# Patient Record
Sex: Male | Born: 1955 | Race: White | Hispanic: No | Marital: Married
Health system: Midwestern US, Academic
[De-identification: ages and names within clinical notes are randomized; demographics above are authoritative.]

---

## 2022-03-19 IMAGING — DX Femur Knee Leg
3 series · 3 of 3 positions shown · non-contrast
Comparison: None.

Procedure(s): XR knee LT 3V

LEFT KNEE 3 VIEWS
INDICATION: Left knee pain

[AP (1 of 2)]
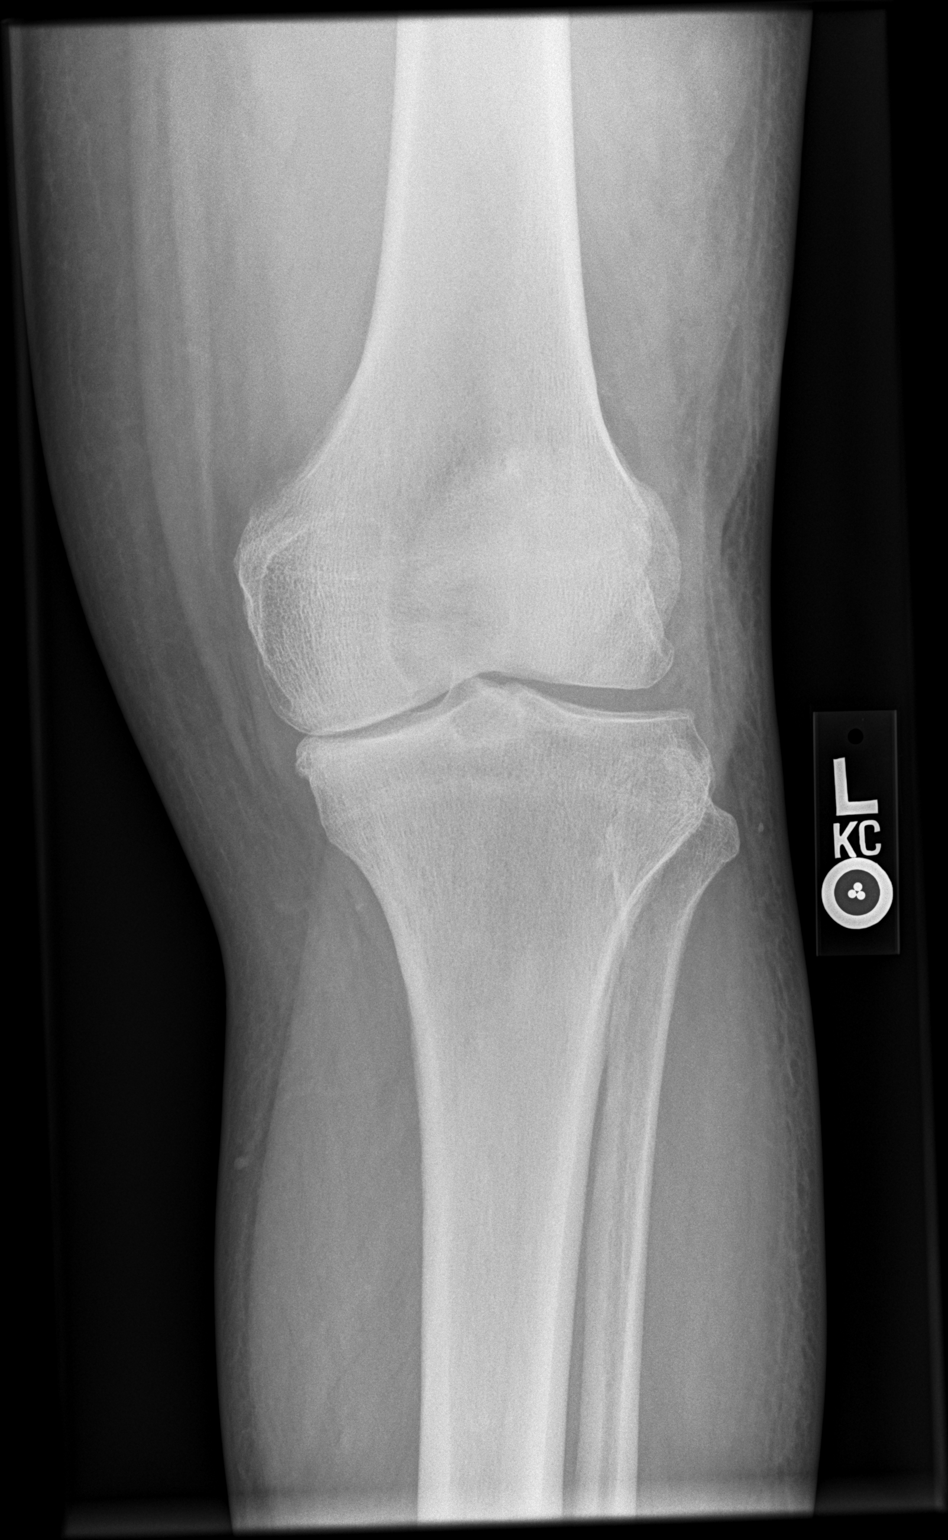

[AP (2 of 2)]
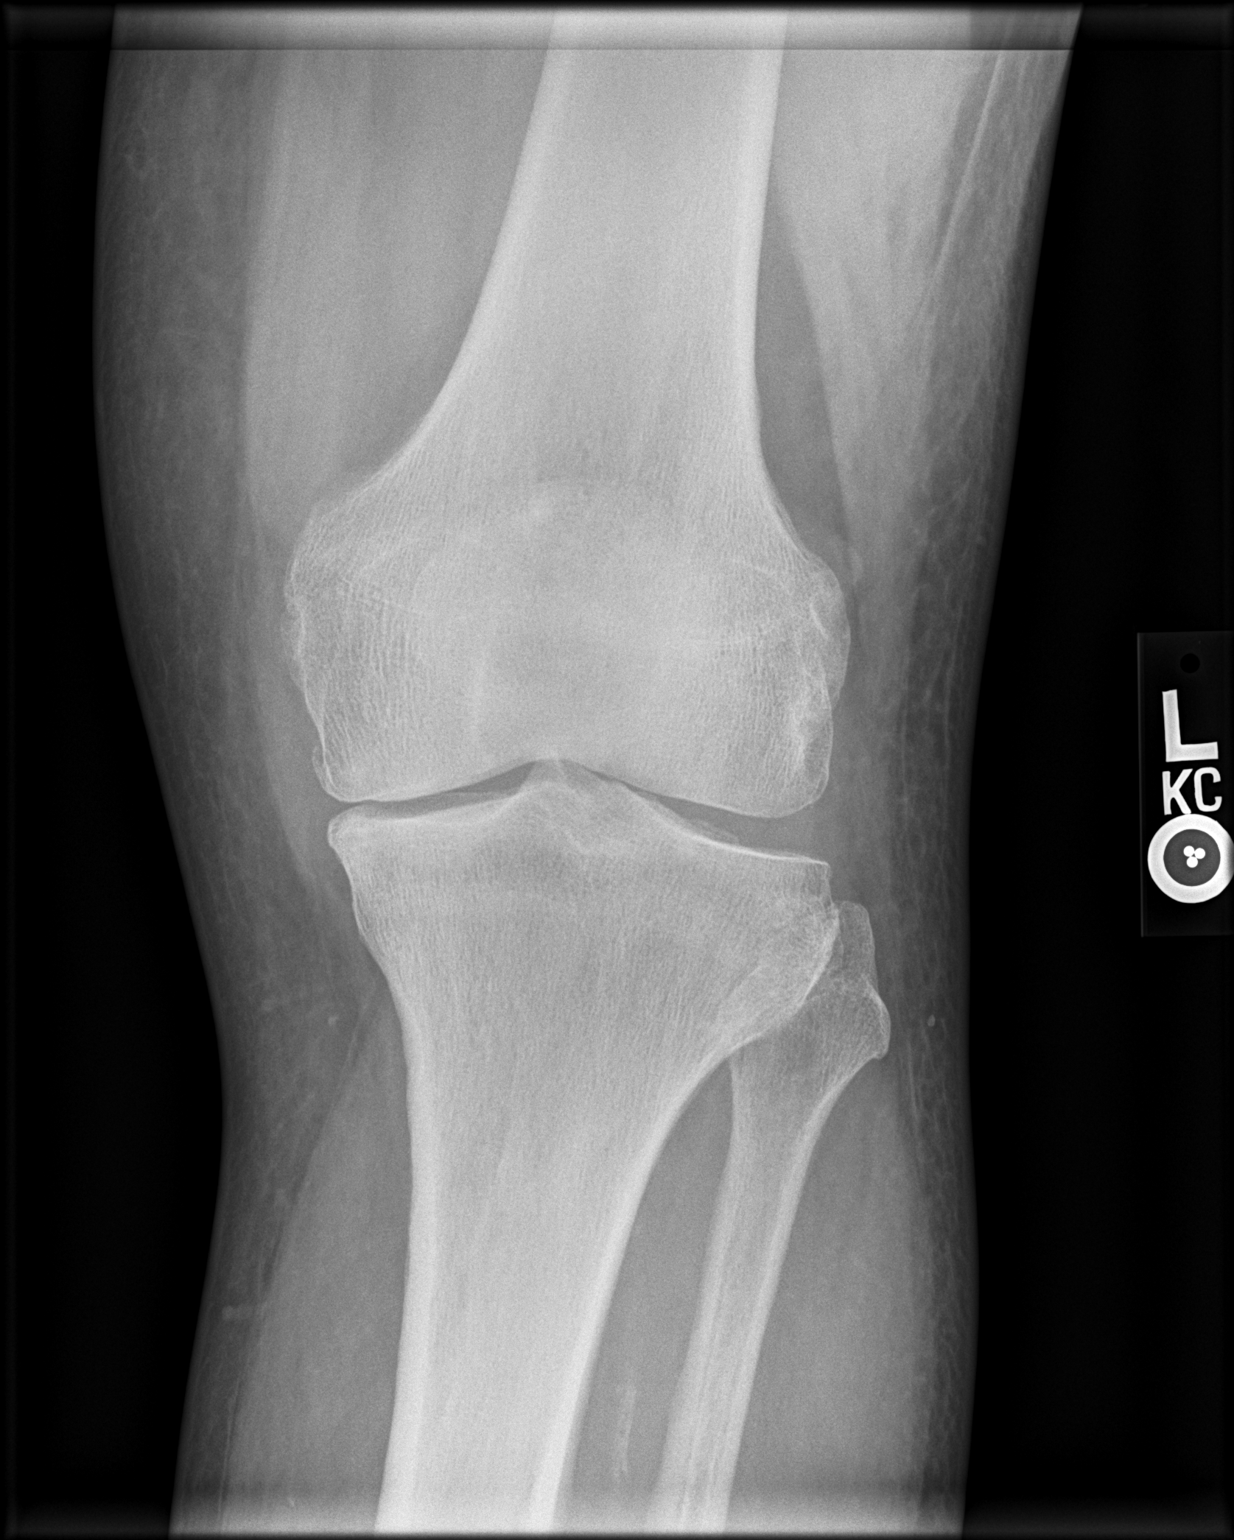

[left lateral]
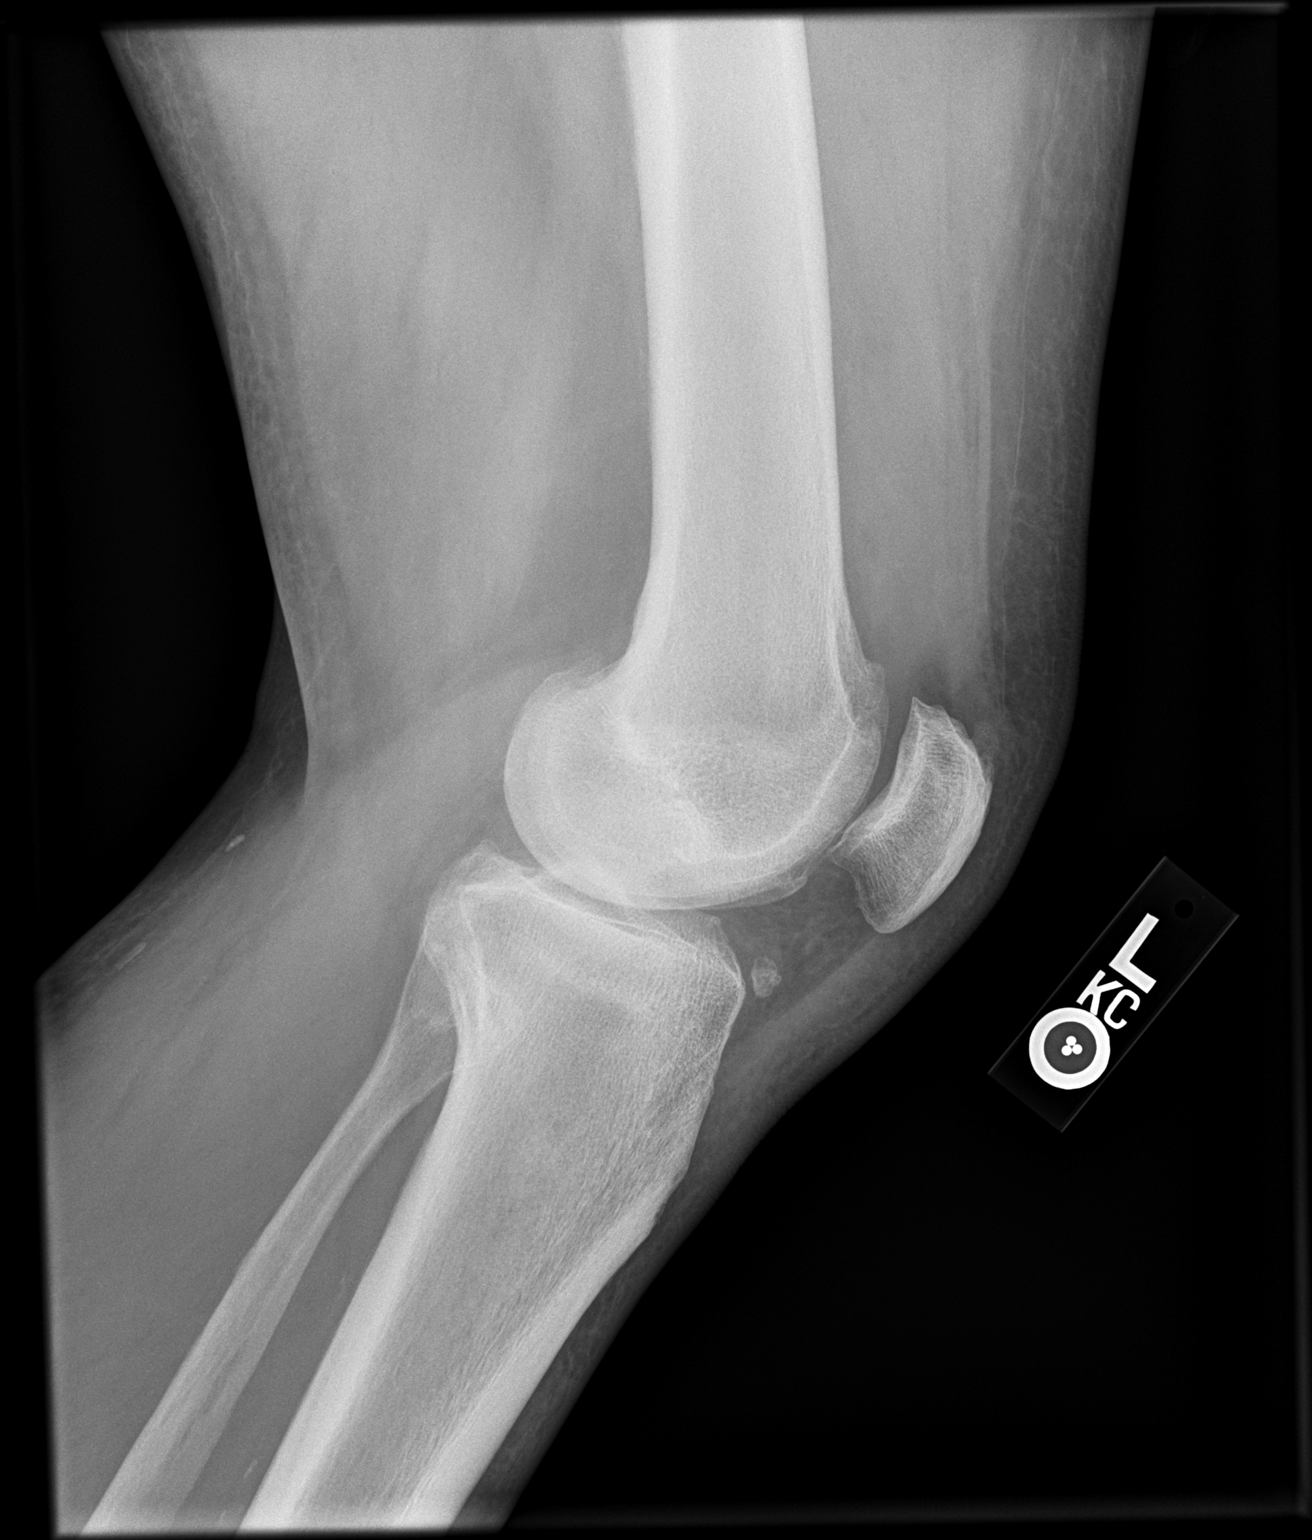

[3 of 3 positions shown; findings below may reference images not displayed]

FINDINGS: Narrowing of the medial compartment of the left knee.
Diffuse edema. The
patellofemoral compartment shows mild narrowing. No fracture
is seen. There is a
well-corticated ossific density in the fat pad anterior to
the proximal tibial
plateau near the midline. This may represent a loose body.
IMPRESSION: 1.  Degenerative changes and possible loose body. No
fracture is seen.

## 2022-05-02 IMAGING — MR KNEE^LEFT
5 of 7 series · 33 of 40 positions shown · non-contrast
Comparison: none

Procedure(s): MR knee LT wo con

EXAM:  MRI of the left knee.
INDICATIONS: Left knee pain.
TECHNIQUE: Routine MRI of the left knee utilizing standard
image sequences.

[Series 3: PD fat-sat · axial · 4.0mm · 0.50mm/px · z∈[-43,+82]mm · 7 of 26 slices shown]
[im 1/26]
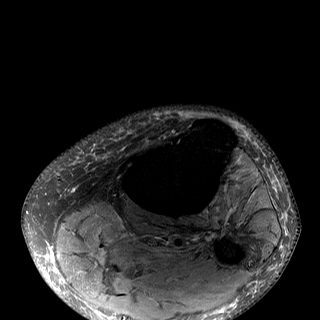
[im 5/26]
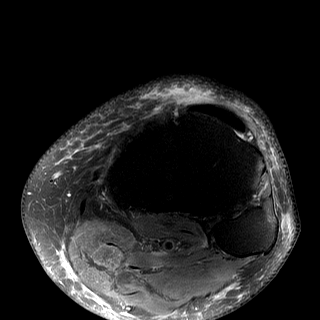
[im 9/26]
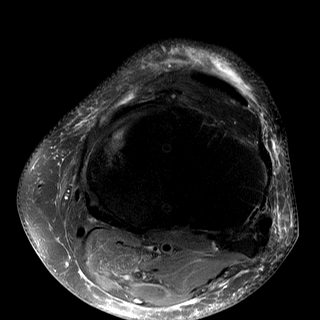
[im 13/26]
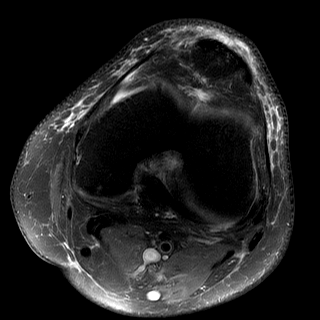
[im 17/26]
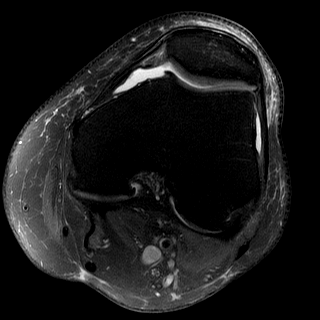
[im 21/26]
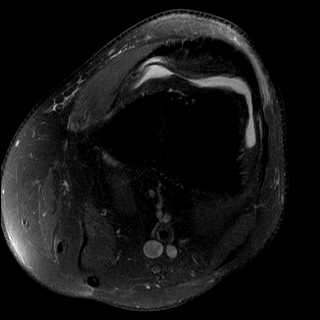
[im 26/26]
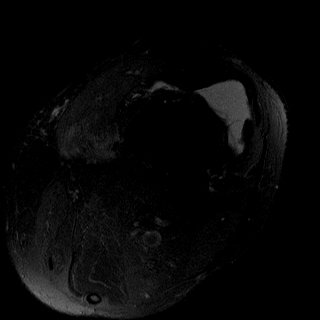

[Series 4: T1 · coronal · 4.0mm · 0.53mm/px · 7 of 27 slices shown]
[im 1/27]
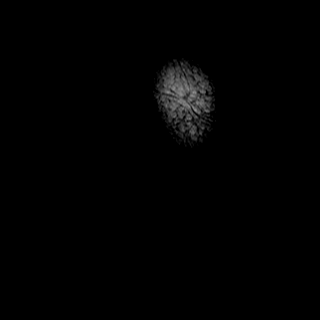
[im 5/27]
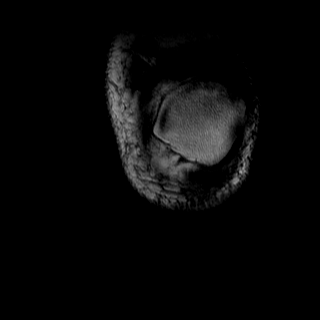
[im 9/27]
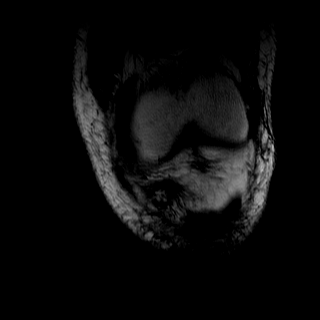
[im 14/27]
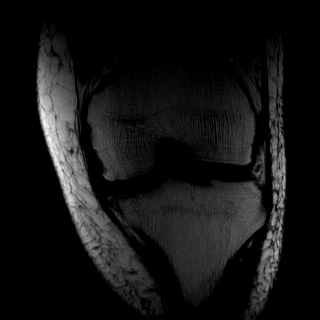
[im 18/27]
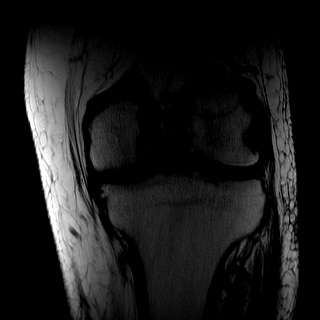
[im 22/27]
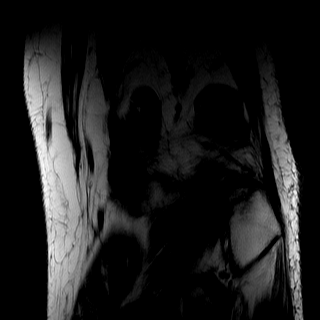
[im 27/27]
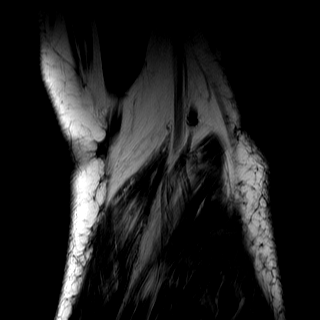

[Series 5: STIR · coronal · 4.0mm · 0.33mm/px · 5 of 27 slices shown]
[im 1/27]
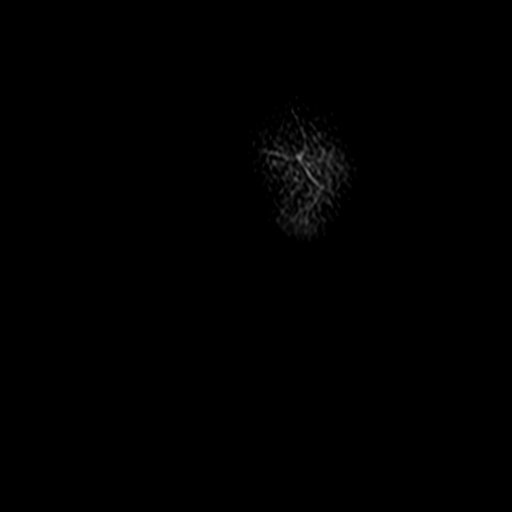
[im 5/27]
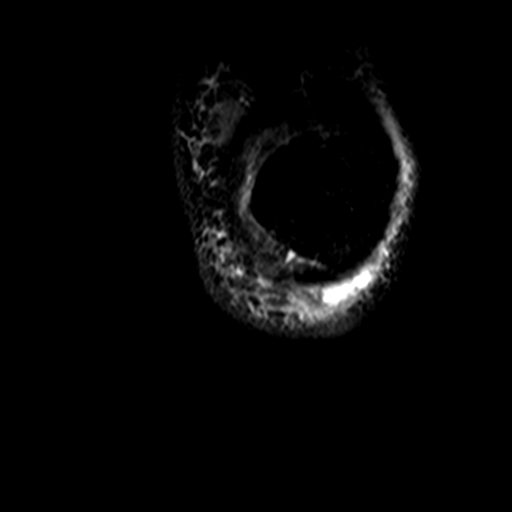
[im 9/27]
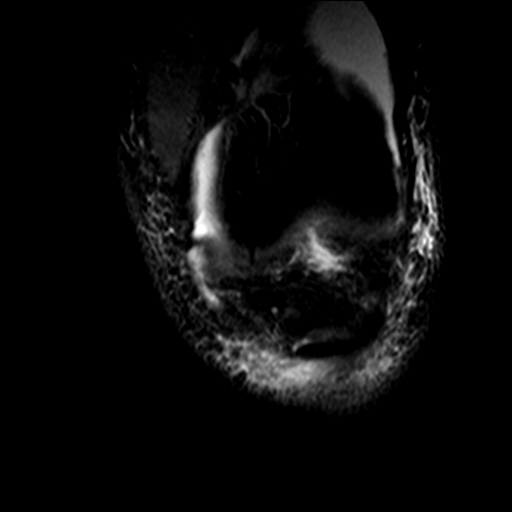
[im 14/27]
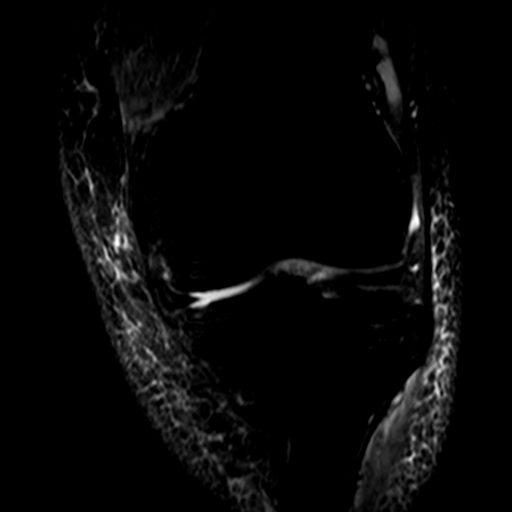
[im 18/27]
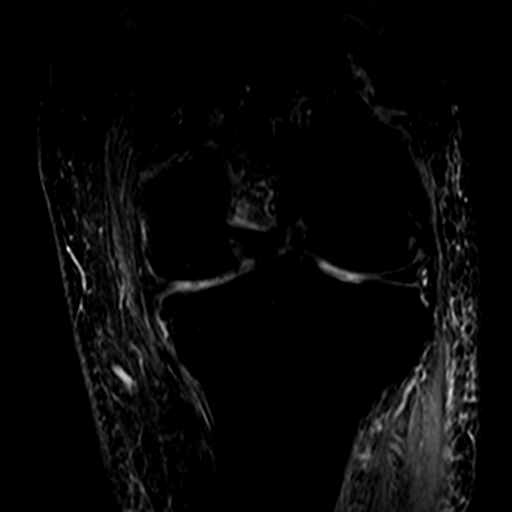

[Series 6: PD · sagittal · 4.0mm · 0.44mm/px · 7 of 27 slices shown]
[im 1/27]
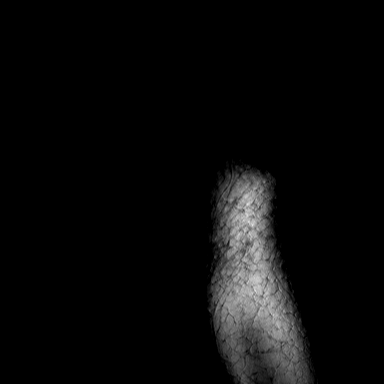
[im 5/27]
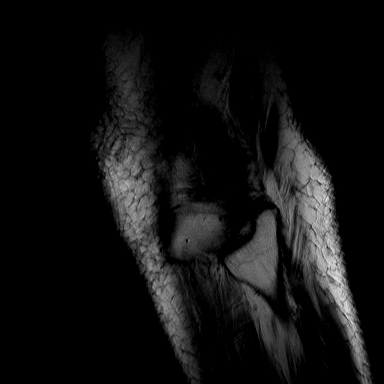
[im 9/27]
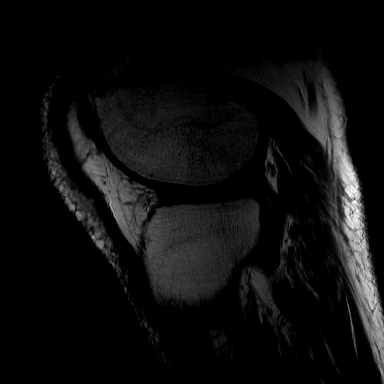
[im 14/27]
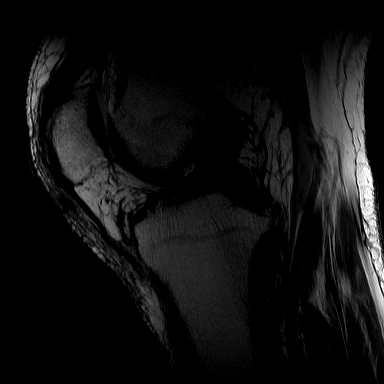
[im 18/27]
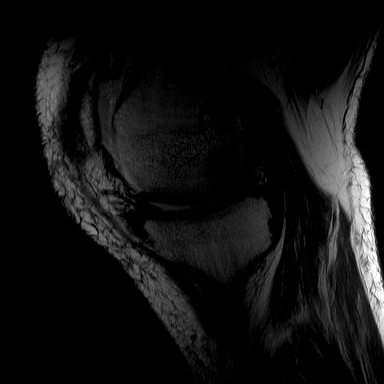
[im 22/27]
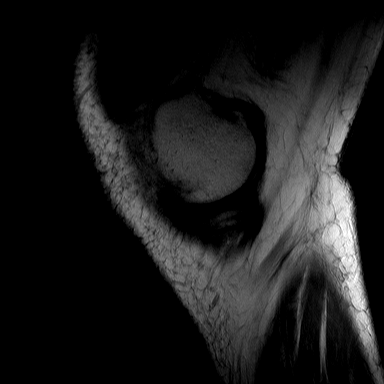
[im 27/27]
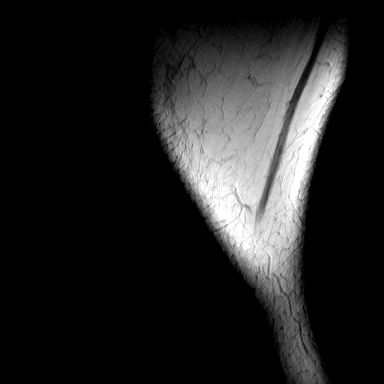

[Series 7: T2 fat-sat · sagittal · 4.0mm · 0.44mm/px · 7 of 27 slices shown]
[im 1/27]
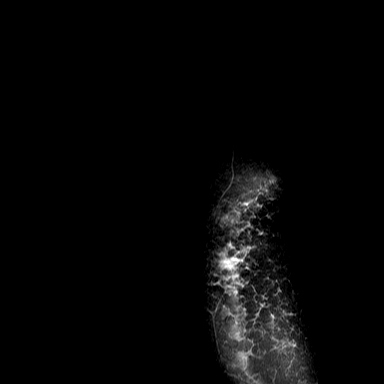
[im 5/27]
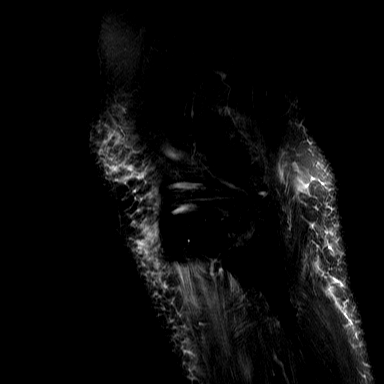
[im 9/27]
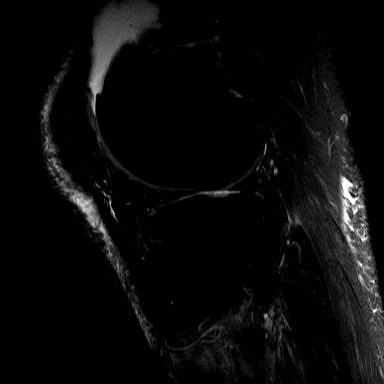
[im 14/27]
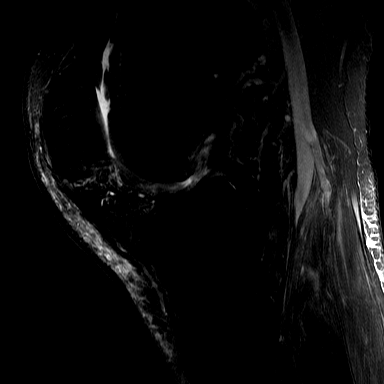
[im 18/27]
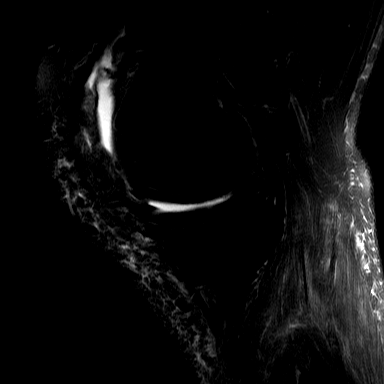
[im 22/27]
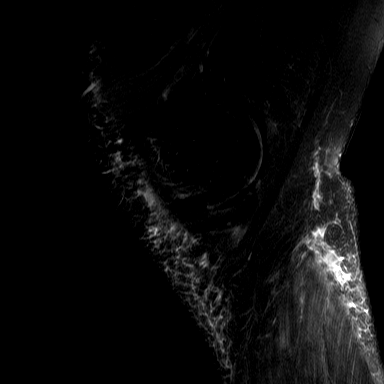
[im 27/27]
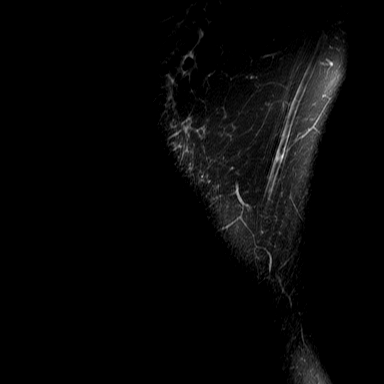

[33 of 40 positions shown; findings below may reference images not displayed]

FINDINGS: There is attenuation and irregularity of the body and
posterior horn of the
medial meniscus most compatible with a chronic degenerative
type tear. There is
mild meniscal extrusion.

The lateral meniscus demonstrates normal signal intensity
and morphology.

The cruciate ligaments, collateral ligaments, and extensor
mechanism are intact.

There is diffuse chondral thinning along the weightbearing
surface of the medial
femoral condyle and medial tibial plateau. There are small
marginal osteophytes.
The chondral surfaces of the lateral compartment are
relatively well-preserved.
There is partial-thickness chondral fibrillation along the
weightbearing surface
of the lateral tibial plateau which measures 13 x 10 mm. The
patellofemoral
compartment is relatively well-preserved with minimal
chondromalacia along the
inferior medial trochlea.

There is a small joint effusion.

There is no significant popliteal cyst. No popliteal fossa
masses are present.

The bone marrow signal intensity is normal.

There is mild edema and atrophy of the medial head of the
gastrocnemius.

The proximal tibiofibular joint is intact. The major
structures of the
posterolateral corner are unremarkable.
IMPRESSION: 1.  Complex degenerative tear of the body and posterior horn
of the medial
meniscus with moderate medial compartment osteoarthritis.

## 2022-05-02 IMAGING — MR MR lumbar spine wo con
4 of 9 series · 21 of 48 positions shown · non-contrast
Comparison: None.

Procedure(s): MR lumbar spine wo con

MRI LUMBAR SPINE WITHOUT CONTRAST
INDICATION: Back pain
TECHNIQUE: Magnetic resonance imaging of the lumbar spine
was performed to
evaluate the spinal canal and contents.  No IV contrast was
administered.

[Series 2: T2 · sagittal · 4.5mm · 0.84mm/px · 6 of 15 slices shown (1 of 3)]
[im 1/15]
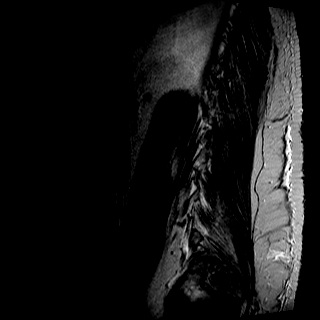
[im 3/15]
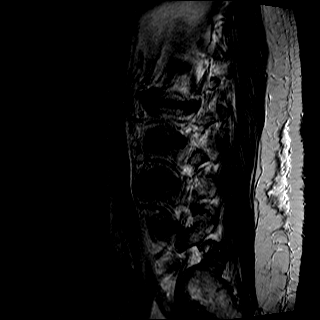
[im 6/15]
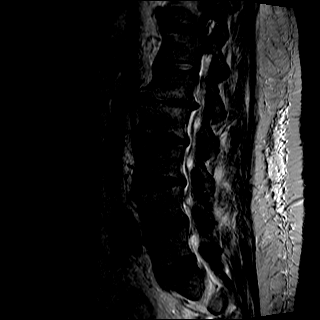
[im 9/15]
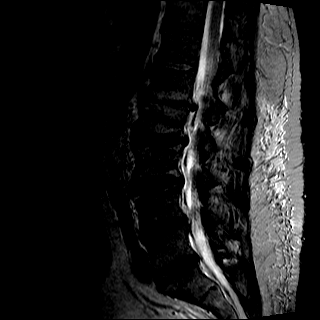
[im 12/15]
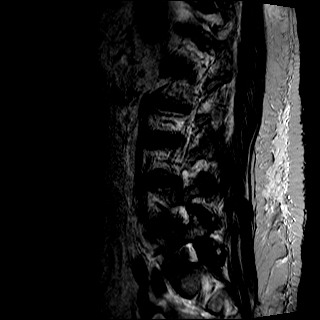
[im 15/15]
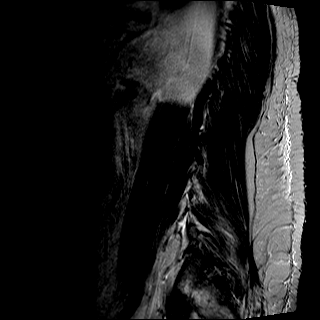

[Series 3: T1 · sagittal · 4.5mm · 0.44mm/px · 3 of 15 slices shown]
[im 3/15]
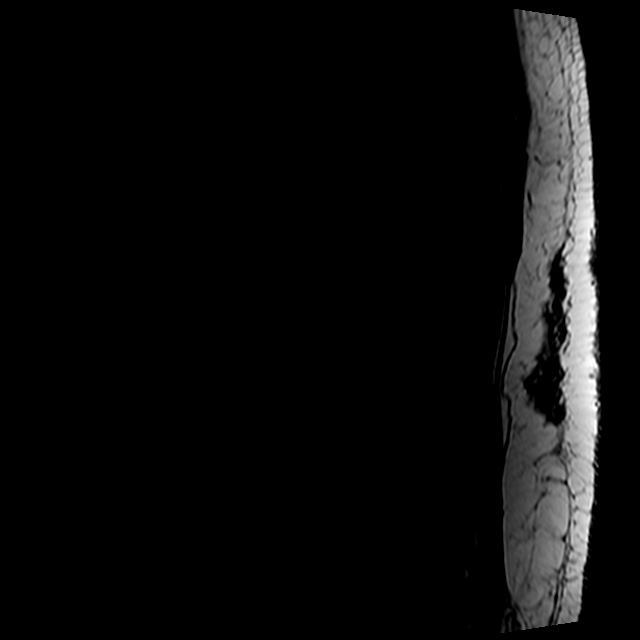
[im 9/15]
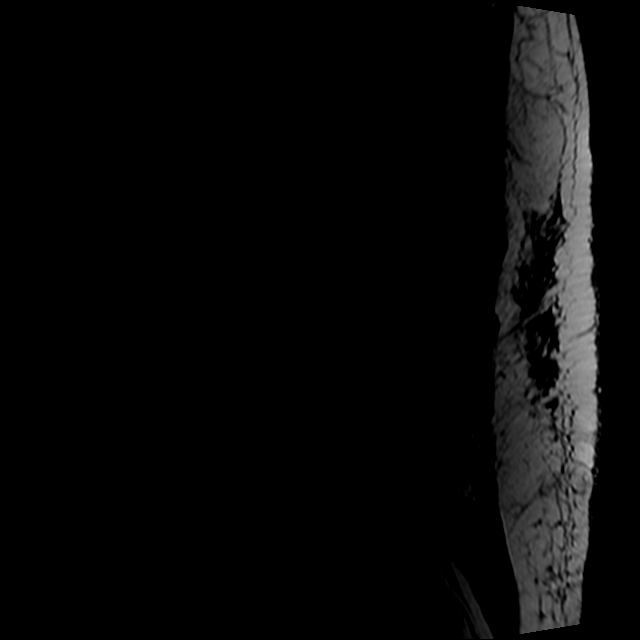
[im 15/15]
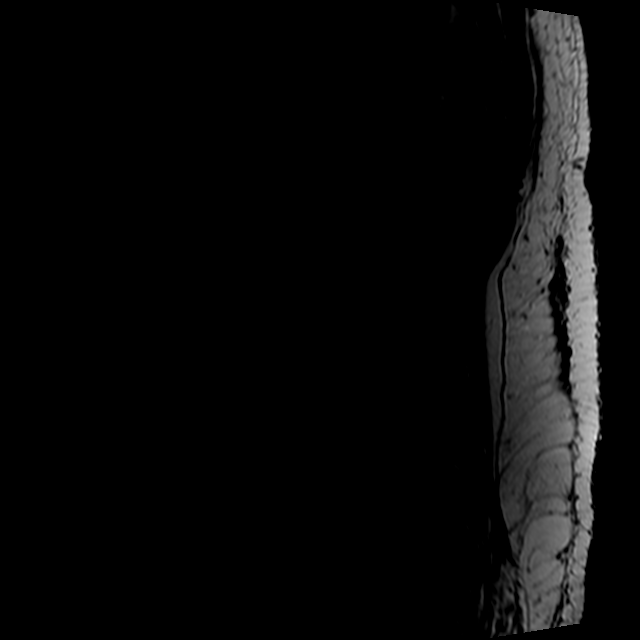

[Series 5: T2 · axial · 4.0mm · 0.49mm/px · z∈[-19,+71]mm · 7 of 19 slices shown (2 of 3)]
[im 1/19]
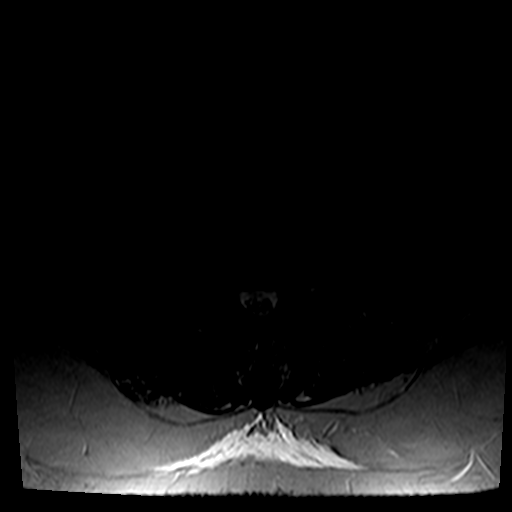
[im 4/19]
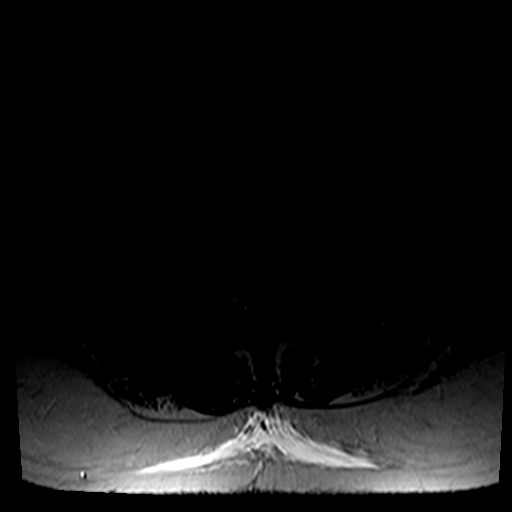
[im 7/19]
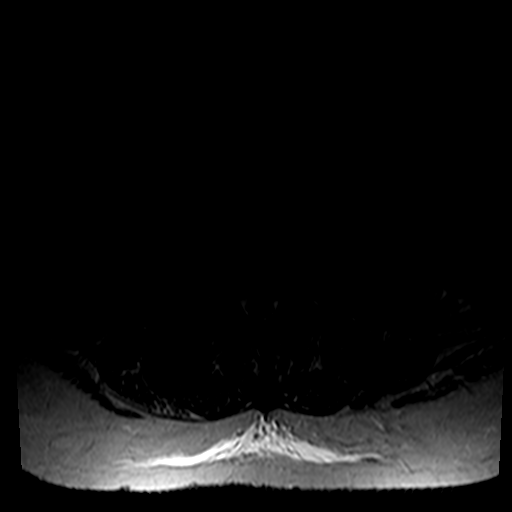
[im 10/19]
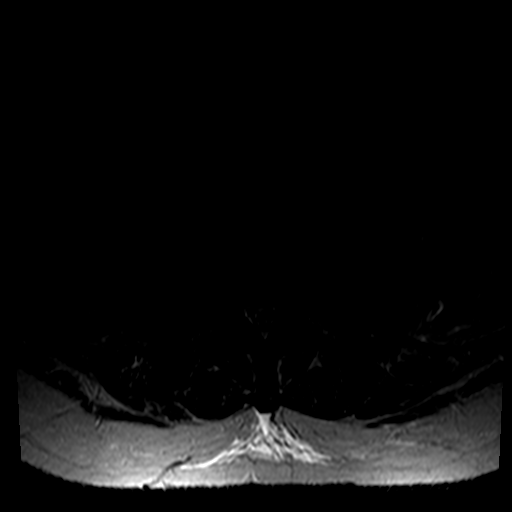
[im 13/19]
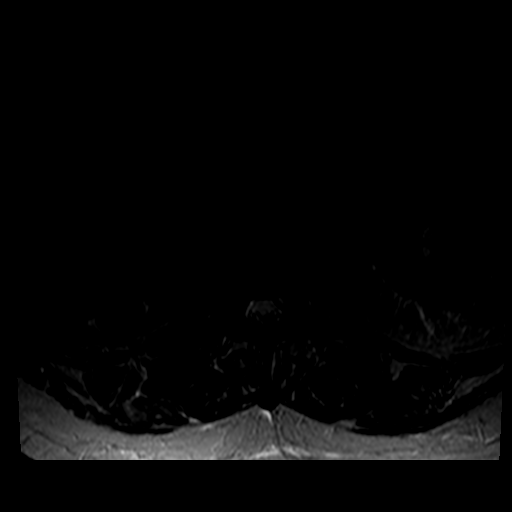
[im 16/19]
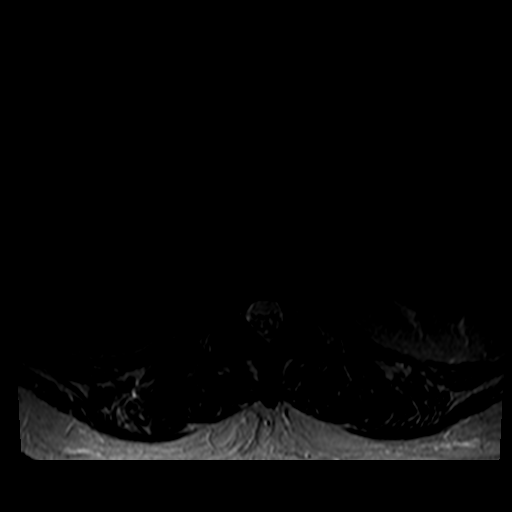
[im 19/19]
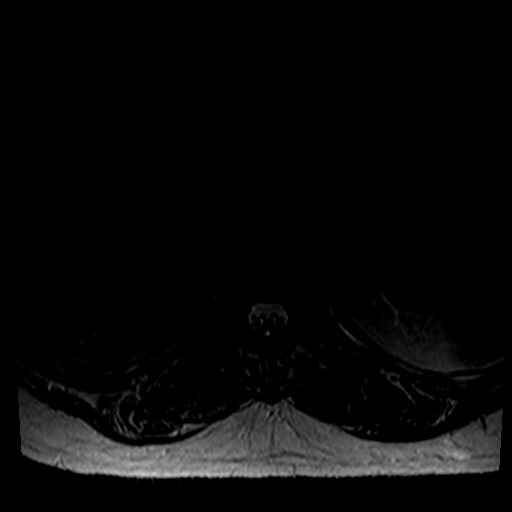

[Series 7: T2 · axial · 4.5mm · 0.55mm/px · z∈[-171,-83]mm · 5 of 20 slices shown (3 of 3)]
[im 1/20]
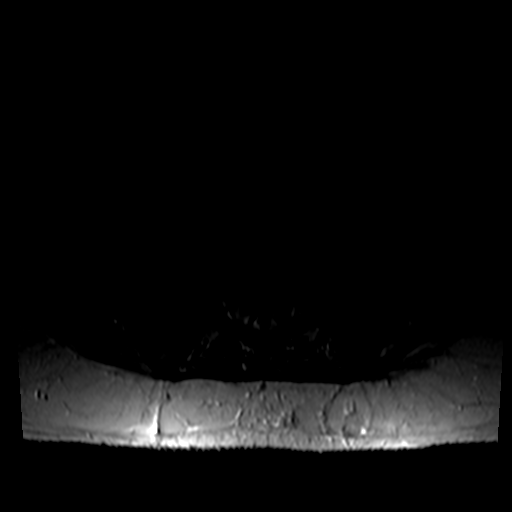
[im 3/20]
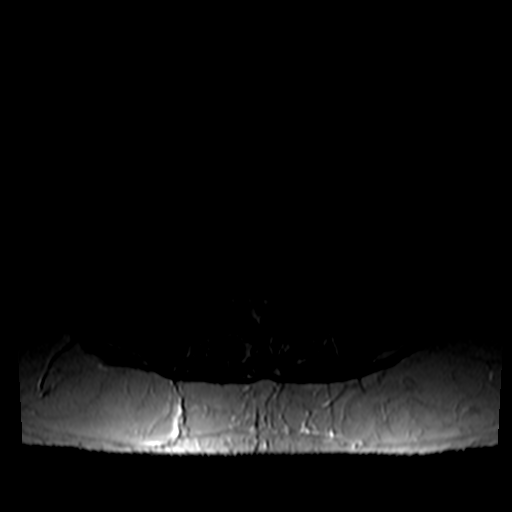
[im 6/20]
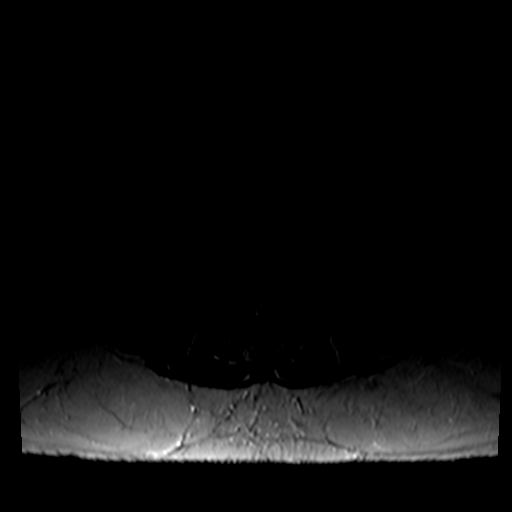
[im 11/20]
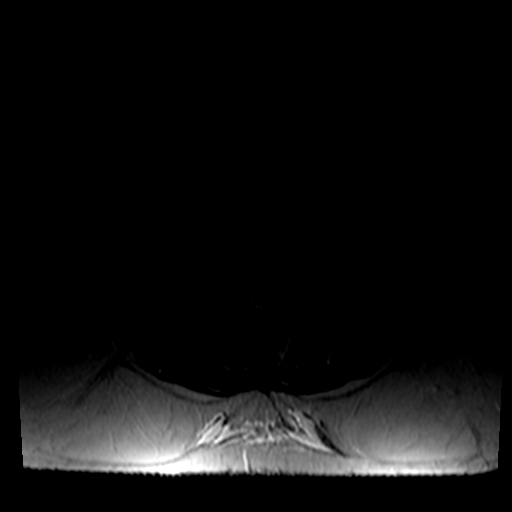
[im 17/20]
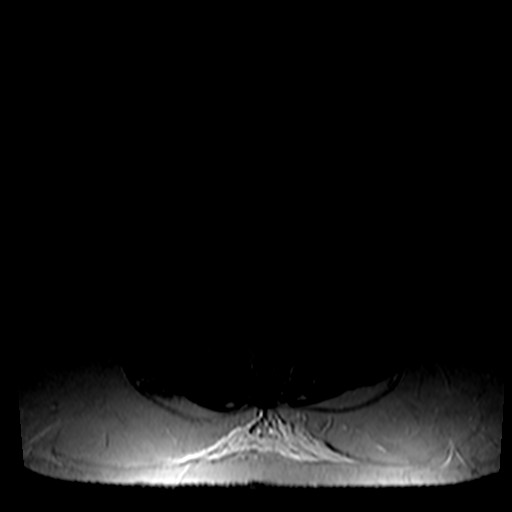

[21 of 48 positions shown; findings below may reference images not displayed]

FINDINGS: Alignment: Straightening of the normal lumbar lordosis is
noted.

Marrow signal: Discogenic endplate changes noted at L2-3. No
marrow replacing
process is seen.

Vertebral body heights: Appear maintained.

Conus: The conus appears unremarkable. Located in normal
position.

Lower thoracic discs: T11-12 demonstrates mild disc space
narrowing. No stenosis
is seen

L1-L2: Significant disc space narrowing, osteophyte
formation and mild facet
disease. There is preservation of the central spinal canal
at 1.3 cm. There is
osteophyte formation and mild disc bulge extending into the
neural foramina
bilaterally. This causes moderate bilateral neural foraminal
narrowing

L2-L3: Broad-based disc bulge, mild facet hypertrophic
change. There is
effacement of the thecal sac with the thecal sac measuring 9
mm, stenotic. Disc
material is seen extending into the neural foramina with
mild bilateral neural
foraminal narrowing

L3-L4: Disc bulge and facet hypertrophic change. Effacement
of the thecal sac
measuring 9 mm. Mild bilateral neural foraminal stenosis,
left slightly greater
than right

L4-L5: Facet and ligamentum flavum hypertrophic change.
Central annular tear and
protrusion. The thecal sac measures 9 mm. Mild lateral
recess and bilateral
neural foraminal narrowing

L5-S1: Facet hypertrophic change. Mild disc bulge. No
stenosis is seen.

Visualized sacral spine: Where visualized appears
unremarkable.
IMPRESSION: 1.  Multilevel lumbar spondylosis. Narrowing is seen
throughout the lumbar spine
but most severe in the neural foramina at L1-2 and L2-3.
Details are outlined
level by level above.
.

## 2022-12-24 ENCOUNTER — Encounter: Admit: 2022-12-24 | Discharge: 2022-12-24 | Payer: MEDICARE

## 2023-01-25 ENCOUNTER — Encounter: Admit: 2023-01-25 | Discharge: 2023-01-25 | Payer: MEDICARE

## 2023-01-25 NOTE — Progress Notes
RN faxed request for EMG report to:    [facility], [phone]; confirmed as sent successfully, job [phone].    [facility], [phone]; confirmed as sent successfully, job [phone].

## 2023-02-05 ENCOUNTER — Encounter: Admit: 2023-02-05 | Discharge: 2023-02-05 | Payer: MEDICARE

## 2023-02-10 ENCOUNTER — Encounter: Admit: 2023-02-10 | Discharge: 2023-02-10 | Payer: MEDICARE

## 2023-02-11 ENCOUNTER — Encounter: Admit: 2023-02-11 | Discharge: 2023-02-11 | Payer: MEDICARE

## 2023-02-11 ENCOUNTER — Ambulatory Visit: Admit: 2023-02-11 | Discharge: 2023-02-11 | Payer: MEDICARE

## 2023-02-11 DIAGNOSIS — G629 Polyneuropathy, unspecified: Secondary | ICD-10-CM

## 2023-02-11 DIAGNOSIS — G718 Other primary disorders of muscles: Secondary | ICD-10-CM

## 2023-02-11 DIAGNOSIS — E119 Type 2 diabetes mellitus without complications: Secondary | ICD-10-CM

## 2023-02-11 DIAGNOSIS — E785 Hyperlipidemia, unspecified: Secondary | ICD-10-CM

## 2023-02-11 DIAGNOSIS — E1144 Type 2 diabetes mellitus with diabetic amyotrophy: Secondary | ICD-10-CM

## 2023-02-11 DIAGNOSIS — I10 Essential (primary) hypertension: Secondary | ICD-10-CM

## 2023-02-11 DIAGNOSIS — M6281 Muscle weakness (generalized): Secondary | ICD-10-CM

## 2023-02-11 DIAGNOSIS — E1142 Type 2 diabetes mellitus with diabetic polyneuropathy: Secondary | ICD-10-CM

## 2023-02-11 LAB — CBC
MCH: 33 pg (ref 26–34)
MCHC: 34 g/dL (ref 32.0–36.0)
MCV: 94 FL (ref 80–100)
RBC COUNT: 4.7 M/UL (ref 4.4–5.5)
RDW: 14 % (ref 11–15)
WBC COUNT: 7.5 K/UL (ref 4.5–11.0)

## 2023-02-11 LAB — TSH WITH FREE T4 REFLEX: TSH: 2.4 uU/mL (ref 0.35–5.00)

## 2023-02-11 LAB — CREATINE KINASE-CPK: CK TOTAL: 54 U/L (ref 35–232)

## 2023-02-11 LAB — VITAMIN B12: VITAMIN B12: 359 pg/mL (ref 180–914)

## 2023-02-11 LAB — COMPREHENSIVE METABOLIC PANEL: SODIUM: 140 MMOL/L (ref 137–147)

## 2023-02-11 LAB — HEMOGLOBIN A1C: HEMOGLOBIN A1C: 6.6 % — ABNORMAL HIGH (ref 4.0–5.7)

## 2023-02-11 NOTE — Patient Instructions
Diabetic amyotrophy, affecting the left leg.  Symptoms started around February 2023.  His hemoglobin A1c was in the 9 range, his diabetes treatment was adjusted for better control and 1 down to 6.5 by May.  He did not have pain, but he does have significant weakness involving his quad and other musculature is noted in the exam table above.  He is trying to control his diabetes, quit drinking beer, and doing exercises which are all positive for better recovery.  He also has diabetic peripheral neuropathy.     Plan:  Lab work today including hemoglobin A1c, vitamin levels.  EMG nerve conduction study of the right arm and bilateral lower extremity with bilateral femoral motors.  Continue physical therapy.  He is doing walking exercises in the pool.  A formal physical therapy referral is reasonable.  Continue good control of diabetes.  Follow-up in a year for clinical monitoring.        Wyn Quaker, MD  Associate Professor of Neurology  Director, ALS Clinic  [facility]

## 2023-02-14 ENCOUNTER — Encounter: Admit: 2023-02-14 | Discharge: 2023-02-14 | Payer: MEDICARE

## 2023-02-14 DIAGNOSIS — G718 Other primary disorders of muscles: Secondary | ICD-10-CM

## 2023-02-14 DIAGNOSIS — E785 Hyperlipidemia, unspecified: Secondary | ICD-10-CM

## 2023-02-14 DIAGNOSIS — I10 Essential (primary) hypertension: Secondary | ICD-10-CM

## 2023-02-14 DIAGNOSIS — E119 Type 2 diabetes mellitus without complications: Secondary | ICD-10-CM

## 2023-02-14 DIAGNOSIS — G629 Polyneuropathy, unspecified: Secondary | ICD-10-CM

## 2023-03-03 ENCOUNTER — Encounter: Admit: 2023-03-03 | Discharge: 2023-03-03 | Payer: MEDICARE

## 2023-03-03 NOTE — Progress Notes
Received outside records for new patient appointment and given to MR for scanning.

## 2023-08-05 ENCOUNTER — Encounter: Admit: 2023-08-05 | Discharge: 2023-08-06 | Payer: MEDICARE

## 2023-08-12 ENCOUNTER — Encounter: Admit: 2023-08-12 | Discharge: 2023-08-13 | Payer: MEDICARE

## 2023-08-12 ENCOUNTER — Encounter: Admit: 2023-08-12 | Discharge: 2023-08-12 | Payer: MEDICARE

## 2023-08-27 ENCOUNTER — Encounter: Admit: 2023-08-27 | Discharge: 2023-08-28 | Payer: MEDICARE

## 2023-09-03 ENCOUNTER — Encounter: Admit: 2023-09-03 | Discharge: 2023-09-03 | Payer: MEDICARE

## 2023-09-04 ENCOUNTER — Ambulatory Visit: Admit: 2023-09-04 | Discharge: 2023-09-05 | Payer: MEDICARE

## 2023-09-04 ENCOUNTER — Encounter: Admit: 2023-09-04 | Discharge: 2023-09-04 | Payer: MEDICARE

## 2023-09-04 DIAGNOSIS — C028 Malignant neoplasm of overlapping sites of tongue: Secondary | ICD-10-CM

## 2023-09-04 DIAGNOSIS — R591 Generalized enlarged lymph nodes: Secondary | ICD-10-CM

## 2023-09-04 DIAGNOSIS — K148 Other diseases of tongue: Secondary | ICD-10-CM

## 2023-09-04 NOTE — Progress Notes
Chief Complaint   Patient presents with    New Patient           History of Present Illness:   Daryl Baker is a 67 y.o. year old male evaluated on 09/04/2023, in the Otolaryngology-Head and Neck Surgery Clinic at the [facility] The patient was referred by Dr. Jeanice Lim for evaluation of mass in the back of his throat that has been present for at least 3 months.       He reports a left neck mass as well. Per documentation on outside exam a large ulcerative mass of the left BOT and vallecular with extension down the left aryepiglottic fold and pyriform sinus noted. Patient reports some difficulty swallowing, but denies trouble breathing. He is a former smoker who quit 40 years ago.      Has some voice changes, difficulty swallowing. Losing weight intentionally with trulicity. No pain when eating.    No hemoptysis, breathing troubles, vision changes, changes in facial movement or sensation, weight loss.    Lurline Idol smokes 1 PPD for 10 years quit 40 years ago. Reports occasional binge drinking.    No FH of H&N cancer.       Patient denies a history of OSA, chest pain, MI, CVA, Sz, DOE or DM.  No blood thinners.    The patient can walk with walker fairly unrestricted.           Past Medical/Surgical History  He  has a past medical history of DM (diabetes mellitus) (HCC), Hyperlipidemia, Hypertension, Neurogenic amyotrophy, and Neuropathy.  His  has a past surgical history that includes Abdominal hernia repair and knee arthroscopy (Left).     Past Family/Social History  His family history includes Cancer in his mother; Cancer-Lung in his mother; Diabetes in his mother; Huntington's Chorea in his maternal grandfather; Hypertension in his mother; Stroke in his father.  He  reports that he has quit smoking. His smoking use included cigarettes. He has been exposed to tobacco smoke. He has never used smokeless tobacco. He reports current alcohol use of about 1.0 standard drink of alcohol per week. He reports that he does not use drugs.     Medications/Allergies/Immunizations  His current medication(s) include:   Current Outpatient Medications   Medication Sig Dispense Refill    FREESTYLE LIBRE 2 READER reader Use one each as directed every 6 hours.      glimepiride (AMARYL) 4 mg tablet Take one tablet by mouth twice daily with meals.      losartan (COZAAR) 100 mg tablet Take one tablet by mouth daily.      meloxicam (MOBIC) 15 mg tablet Take one tablet by mouth daily.      metFORMIN-XR (GLUCOPHAGE XR) 750 mg extended release tablet Take one tablet by mouth daily with dinner.      metoprolol succinate XL (TOPROL XL) 100 mg extended release tablet Take one tablet by mouth daily.      TRULICITY 0.75 mg/0.5 mL injection pen Inject 0.5 mL under the skin every 7 days.       No current facility-administered medications for this visit.       Allergies: Patient has no known allergies.     Review of Systems   Constitutional: Negative for fever, weight loss and weight gain.  Skin: Negative for rash, itchiness, dryness  HENT: Negative for ear pain, sore throat and hoarseness.  Negative for difficulty swallowing.  Cardiovascular: Negative for chest pain and dyspnea on exertion (  Can climb up 2 floors).   Respiratory: Is not experiencing shortness of breath.   Gastrointestinal: Negative for nausea and vomiting.   Neurological: Negative for headaches.   Lymph/Heme: Negative for lymphadenopathy or easy bruising  Musculoskeletal: Negative for joint or muscle pain  Psychiatric: The patient is not nervous/anxious.        All other systems are negative except for that listed in the HPI.      PHYSICAL EXAM:   Vital Signs:  BP (!) 188/99  - Pulse 58  - Ht 177.8 cm (5' 10)  - Wt 109.8 kg (242 lb)  - BMI 34.72 kg/m?      General:  Well-developed, well-nourished  Communication and Voice:  Clear pitch and clarity  Hearing: Hearing adequate for verbal communication bilaterally   Inspection:  Normocephalic and atraumatic without mass or lesion  Palpation:  Facial skeleton intact without bony stepoffs  Parotid Glands:  No mass or tenderness  Facial Strength:  Facial motility symmetric and full bilaterally  Pinna:  External ear intact and fully developed  External canal:  Canal is patent with intact skin  Tympanic Membrane:  Clear and mobile  External nose:  No scar or anatomic deformity  Internal Nose:  Septum intact and midline.  No edema, polyp, or rhinorrhea.  TMJ:  No pain to palpation with full mobility  Oral cavity, Lips, Teeth, and Gums:  Mucosa and teeth intact and viable  Oropharynx: No erythema or exudate,  non-obstructive tonsils. Firm palpable left BOT tumor. Not easily visible on OC exam without pulling patient's tongue forward. Does not extend to the palate or the tonsil that can be seen on OC exam.  Nasopharynx:  No mass or lesion with intact mucosa  Neck, Trachea, Lymphatics:  Midline trachea without mass or lesion, bilateral nodal involvement in lateral neck L > R.  Multiple palpable lymph nodes.  Most prominently left level 2.  Thyroid:  No mass or nodularity  Eyes: No nystagmus with equal extraocular motion bilaterally  Neuro/Psych/Balance: Patient oriented and appropriate in interaction;  Appropriate mood and affect;  Gait is intact with no imbalance; Cranial nerves I-XII are intact  Respiratory effort:  Equal inspiration and expiration without stridor  Peripheral Vascular:  Warm extremities with equal pulses       LARYNGOSCOPY: Flexible laryngoscopy was performed using flexible laryngoscope through the left nare.  The nasal passage was clear and without polyposis or mucopurulent secretions.  The nasopharyngeal mucosa appeared normal.    The fossa of Rosenmuller was clear and the eustachian tube openings were without mass or lesion. Large left sided BOT tumor with mucosal erosion. Does cross midline. Does involve lingual surface of the epiglottis and extend into the vallecula as well as to the left AE fold.  The false folds appeared normal and were without mucosal mass or lesion. The true vocal folds were without discernable mucosal or submucosal lesions.  The right and left cords adducted and abducted fully.  Though difficult to assess without stroboscopy, closure appeared to be complete.  A limited view of the subglottis revealed no mucosal lesions or stenosis.        PATHOLOGY REVIEW:   No biopsy yet.     RADIOLOGIC REVIEW:         Reports reviewed.  Will get imaging clouded.        Assessment and Plan:   My impression is that Mr. 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    Recommend US guided biopsy of left lymph node.  Will require p16 testing.  Once confirm tissue diagnosis would get a PET scan.     Following completion of evaluation we will present patient at TB and then likely anticipate treatment with primary chemoradiation. Will require med onc and rad onc referrals once a tissue diagnosis is established.            Total time on this visit 63 min including preparing to see patient by reviewing records, performing interview and exam, counseling and medical decision making, documentation.

## 2023-09-05 ENCOUNTER — Encounter: Admit: 2023-09-05 | Discharge: 2023-09-05 | Payer: MEDICARE

## 2023-09-08 ENCOUNTER — Encounter: Admit: 2023-09-08 | Discharge: 2023-09-08 | Payer: MEDICARE

## 2023-09-08 DIAGNOSIS — C069 Malignant neoplasm of mouth, unspecified: Secondary | ICD-10-CM

## 2023-09-08 NOTE — Telephone Encounter
Wife called , linda she has come questions regarding visit  12/5 . Please call her back .

## 2023-09-08 NOTE — Telephone Encounter
Call returned to the patient and the PET scan is being rescheduled after the biopsy so hopefully it can coordinate with his future appointments with medical and radiation oncology yet to be scheduled.

## 2023-09-12 ENCOUNTER — Encounter: Admit: 2023-09-12 | Discharge: 2023-09-12 | Payer: MEDICARE

## 2023-09-19 ENCOUNTER — Encounter: Admit: 2023-09-19 | Discharge: 2023-09-19 | Payer: MEDICARE

## 2023-09-19 ENCOUNTER — Ambulatory Visit: Admit: 2023-09-19 | Discharge: 2023-09-19 | Payer: MEDICARE

## 2023-09-19 NOTE — Progress Notes
Name: Daryl Baker          MRN: [id number]      DOB: April 18, 1956      AGE: 67 y.o.   DATE OF SERVICE: 09/26/2023                Reason for Visit: Establish Medical Oncology Care with Dr Daryl Baker    History of Present Illness    PCP: Daryl Baker  Presenting Physician: Dr. Tama Baker   Medical Oncologist:Dr. Neita Baker  Surgeon: Daryl Gula, MD  Radiation Oncologist: Daryl Providence, MD     Discussion:  Prospective discussion                 Reason for presentation: clarify pathology, discuss tx options                 Clinical History/Significant PMH:  67 y.o male being referred for oropharyngeal mass with lymphadenopathy. Patient was referred to ENT Dr. Jeanice Baker for mass in the back of his throat that has been present for at least 3 months. He reports a left neck mass as well. Per outside report on exam a large ulcerative mass of the left BOT and vallecular with extension down the left aryepiglottic fold and pyriform sinus noted. Patient reports some difficulty swallowing, but denies trouble breathing. He is a former smoker who quit 40 years ago.        Interval History:    Weight loss 38 lbs over the last 6 mos     Odynophagia    (L) tinnitus     Some left-sided lymph nodes that are minimally bothering    Hb A1c 6.7     He is a truck driver.  He smoked almost 40 years ago.  Occasional binging with EtOH.  Accompanied with spouse today.      Resulted Micro Last 72 Hrs    No results found            Resulted Micro Last 72 Hrs    No results found           Cancer Staging   No matching staging information was found for the patient.         There is no STAGING for Gliomas, staging is not the same as GRADING.         Review of Systems              FREESTYLE LIBRE 2 READER reader Use one each as directed every 6 hours.    glimepiride (AMARYL) 4 mg tablet Take one tablet by mouth twice daily with meals.    losartan (COZAAR) 100 mg tablet Take one tablet by mouth daily.    meloxicam (MOBIC) 15 mg tablet Take one tablet by mouth daily.    metFORMIN-XR (GLUCOPHAGE XR) 750 mg extended release tablet Take one tablet by mouth daily with dinner.    metoprolol succinate XL (TOPROL XL) 100 mg extended release tablet Take one tablet by mouth daily.    TRULICITY 0.75 mg/0.5 mL injection pen Inject 0.5 mL under the skin every 7 days.     Vitals:    09/26/23 1248   BP: (!) 156/91   BP Source: Arm, Right Upper   Pulse: 104   Temp: 36.3 ?C (97.4 ?F)   Resp: 18   SpO2: 98%   PainSc: Zero   Weight: 109.8 kg (242 lb)     Body mass index is 34.72 kg/m?Marland Kitchen     Pain Score: Zero  Fatigue Scale: 0-None     Physical Exam      Domains    Gait 1 - Abnormal but walks without assistance    Strength 0- Normal   Ataxia (upper extremities) 0- Normal   Sensation 0- Normal   Visual fields 0- Normal   Facial strength 0- Normal   Language 0- Normal   Level of consciousness 0- Normal   Behavior 0- Normal     TOTAL NANO SCORE: 1    This is an age-appropriate obese 67 year old Caucasian male with a KPS of % 90.  VSS are stable and as above. AAO x 3.HEENT: Cranial nerves are intact. PERRLA. EOMI. TML. Transoral exam within normal limits. Neck: Supple, with normal ROM.  BOT tumor not visible large left level 2 l lymphadenopathy with ill-defined borders noted lymphadenopathy.  THORAX: Lungs clear to auscultation bilaterally. PSYCH: Pleasant, cooperative.         CBC w diff    Lab Results   Component Value Date/Time    WBC 7.5 02/11/2023 11:43 AM    RBC 4.76 02/11/2023 11:43 AM    HGB 15.7 02/11/2023 11:43 AM    HCT 45.1 02/11/2023 11:43 AM    MCV 94.7 02/11/2023 11:43 AM    MCH 33.1 02/11/2023 11:43 AM    MCHC 34.9 02/11/2023 11:43 AM    RDW 14.6 02/11/2023 11:43 AM    PLTCT 192 02/11/2023 11:43 AM    MPV 10.1 02/11/2023 11:43 AM    No results found for: NEUT, ANC, LYMA, ALC, MONA, AMC, EOSA, AEC, BASA, ABC     Comprehensive Metabolic Profile    Lab Results   Component Value Date/Time    NA 140 02/11/2023 11:43 AM    K 3.9 02/11/2023 11:43 AM    CL 104 02/11/2023 11:43 AM    CO2 25 02/11/2023 11:43 AM    GAP 11 02/11/2023 11:43 AM    BUN 6 (L) 02/11/2023 11:43 AM    CR 0.75 02/11/2023 11:43 AM    GLU 141 (H) 02/11/2023 11:43 AM    Lab Results   Component Value Date/Time    CA 9.3 02/11/2023 11:43 AM    ALBUMIN 4.4 02/11/2023 11:43 AM    TOTPROT 7.2 02/11/2023 11:43 AM    ALKPHOS 79 02/11/2023 11:43 AM    AST 18 02/11/2023 11:43 AM    ALT 17 02/11/2023 11:43 AM    TOTBILI 0.8 02/11/2023 11:43 AM         TSH   Lab Results   Component Value Date/Time    TSH 2.41 02/11/2023 11:43 AM         Pathological Data ( including NGS, if applicable)    No results found for requested labs within last 365 days.         Radiological Data:     IR LYMPH NODE BIOPSY  Narrative: Exam: Ultrasound-guided core biopsy of neck mass.    History: Neck mass.    Comparison studies: Recent CT scan.    Technique: After obtaining informed written consent the patient was placed supine on the procedure table. The neck was prepped and draped in sterile fashion. Ultrasound was used to identify the left neck mass. Permanent sonographic images were obtained. Lidocaine was used for local anesthesia. An 18-gauge core biopsy device was advanced under sonographic visualization into the lymph node. Permanent sonographic images were obtained to document the position of the device within the lymph node. 3 core biopsies were obtained, these were placed in formalin,  and sent to surgical pathology.    The patient tolerated the procedure well and left the department in good condition.  Impression: Impression: Ultrasound-guided core biopsy of a left neck mass.    I, Elvera Lennox, M.D., the attending radiologist, was present for the procedure, personally reviewed the images, and formulated the interpretations and opinions expressed in this report.    @TT      Finalized by Elvera Lennox, M.D. on 09/19/2023 9:24 AM. Dictated by Elvera Lennox, M.D. on 09/19/2023 9:23 AM.                Pain Addressed:  Patient declines intervention    Patient Evaluated for a Clinical Trial: Patient currently in screening for a treatment clinical trial.     Eastern Cooperative Oncology Group performance status is 1, Restricted in physically strenuous activity but ambulatory and able to carry out work of a light or sedentary nature, e.g., light house work, office work.         Impression/Recommendation:      This a very nice 67 year old gentleman who appears to have a large left BOT tumor but multilevel most prominent level 2 bilateral nodal involvement left greater than right.  The tumor does not appear to be crossing the midline.  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    He will be getting interval PET scan next week and see Dr. Mariane Masters next Friday.    We have briefly talked about de-intensification trials for p16+ oropharyngeal cancers,utilizing less radiation of 60 Gy in 6 weeks versus 60 Gy in 5 weeks with or without chemotherapy.  However, all of these de-intensification trials are currently under investigation, therefore not recommendable as standard treatment at this time. Typical standard XRT approach is 70 Gy of IMRT in 35 fractions with 56 Gy to the at risk lymph nodes which is SOC at this time.  Therefore, concurrent with IMRT, we are planning to proceed with standard treatment 70 Gy in 35 fractions along withweekly cisplatinum at 40 mg/m?    Depending on the level of response, he would be eligible for EVOLVE trial.        Toxicity profile of cis-platinum along with AE's have been discussed in detail with the patient.      -Dental Evaluation have been completed  -Baseline hearing test  -Baseline CBC CMP and TSH  -PET scan pending  - Dr Mariane Masters appointment next week   -Port placement  -Chemo teach and AIU appointment       The case has been extensively discussed with the patient and the family in the room who verbalized understanding, questions were answered and a total of 55 minutes was spent discussing about the diagnosis,prognosis, treatment options and toxicity profile as well as long-term outcomes.     Gita Kudo, MD   Neuro-Oncology/Medical Oncology

## 2023-09-26 ENCOUNTER — Encounter: Admit: 2023-09-26 | Discharge: 2023-09-26 | Payer: MEDICARE

## 2023-09-26 DIAGNOSIS — E1142 Type 2 diabetes mellitus with diabetic polyneuropathy: Secondary | ICD-10-CM

## 2023-09-26 DIAGNOSIS — C109 Malignant neoplasm of oropharynx, unspecified: Secondary | ICD-10-CM

## 2023-09-26 DIAGNOSIS — R5382 Chronic fatigue, unspecified: Secondary | ICD-10-CM

## 2023-09-26 NOTE — Progress Notes
Chemotherapy Education    Provided patient with written and verbal education regarding cisplatin chemotherapy along with concurrent radiotherapy for the treatment of squamous cell carcinoma of base of tongue.    Reviewed chemotherapy schedule. Patient will receive chemotherapy as follows:  cisplatin IV over 60 minutes (with pre and post-hydration) weekly. Duration of radiation to be determined by radiation oncology, appointment is scheduled on 10/02/22.     Reviewed antiemetic schedule as follows (as needed medications below may not all be initially prescribed):  Olanzapine (Zyprexa) 5 mg by mouth once daily at bedtime for 4 nights starting on cisplatin infusion day.  Prochlorperazine (Compazine) 10 mg by mouth every 6 hours as needed for nausea/vomiting.  Ondansetron (Zofran) 8 mg by mouth every 8 hours as needed for nausea/vomiting (avoid on Days 1-3 of each chemotherapy cycle).    Reviewed side effects of chemotherapy, including - but not limited to:  Low blood counts (explained associated risk for infection, bleeding, bruising, fatigue, weakness)   Nausea and vomiting (explained the purpose of pre-chemotherapy administered antiemetics before chemotherapy and the use of PRNs in the event of breakthrough CINV)   Potential kidney toxicity and electrolyte abnormalities (explained the purpose of the hydration accompanying chemotherapy and the importance of maintaining oral intake)   Changes in lab tests (explained that the medical team will be monitoring lab values closely)    Tinnitus and hearing changes  Peripheral neuropathy   Changes in bowel habits (diarrhea) / poor appetite / mouth sores & mucositis / taste changes     Reviewed potential for allergic reactions and anaphylaxis and subsequent hypersensitivity protocol.    Advised patient to contact clinic for any of the following scenarios or if any other questions/concerns arise:  Persistent uncontrolled nausea/vomiting uncontrolled by PRN antiemetics  Extreme fatigue  Inability to self-hydrate or eat for >24 hours  Other GI issues unresponsive to current medications  Uncontrolled pain    Educated patient to not start any new medications, vitamins, or herbals prior to contacting clinic.    A medication history and reconciliation was performed (including prescription medications, supplements, over the counter medications, and herbal products). The medication list was updated. Drug-drug and drug-food interactions with the new therapy were assessed and reviewed with the patient. No significant drug-drug interactions were identified.  We will be avoiding post-chemotherapy dexamethasone due to history of diabetes and monitor blood sugars.     Patient voiced understanding about the provided information. All questions/concerns addressed at this time. Medication handout(s) provided.    Vena Austria, PHARMD

## 2023-09-26 NOTE — Progress Notes
Plan: lodging    Intervention: SW notified by team of pt needing lodging info. SW met with pt in clinic; pt's spouse present for discussion. Pt states he will soon be starting daily radiation and weekly chemo for 7 weeks and inquiring about lodging resources. SW explained [facility] including referral process and ESA program; provided info. SW provided info as well as a full lodging list. SW answered all questions. SW encouraged pt to contact primary SW [place] once rad/onc apts are scheduled and lodging is needed; provided business card.     Kylin Genna, LMSW

## 2023-10-03 ENCOUNTER — Ambulatory Visit: Admit: 2023-10-03 | Discharge: 2023-10-03 | Payer: MEDICARE

## 2023-10-03 ENCOUNTER — Encounter: Admit: 2023-10-03 | Discharge: 2023-10-03 | Payer: MEDICARE

## 2023-10-03 ENCOUNTER — Encounter: Admit: 2023-10-03 | Discharge: 2023-10-04 | Payer: MEDICARE

## 2023-10-03 DIAGNOSIS — H903 Sensorineural hearing loss, bilateral: Secondary | ICD-10-CM

## 2023-10-03 NOTE — Progress Notes
Daryl Baker was seen in the clinic today for an audiologic evaluation. He was referred prior to chemo therapy.   Answer the following questions.  Referred by:: (Patient-Rptd) dr   Neita Carp  Do you have trouble hearing?: (Patient-Rptd) No  Has someone told you that you are not hearing conversations?: (Patient-Rptd) No  Do you have one ear that hears better than the other?: (Patient-Rptd) Yes  If yes, which one?: (Patient-Rptd) Right  Do you have ringing or buzzing in your ears?: (Patient-Rptd) Yes  If yes, which ear?: (Patient-Rptd) Both  Is it bothersome?: (Patient-Rptd) Sometimes  When did it begin?: (Patient-Rptd) last year  Have you taken any medications/treatments that may cause hearing loss/tinnitus? (e.g., chemotherapy, radiation, or antibiotics?: (Patient-Rptd) No  Do you have a family history of hearing loss?: (Patient-Rptd) No  Do you have a history of noise exposure? 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       Recommendation:  Daryl Baker should return to the clinic for a hearing aid consult. Patient may have a hearing aid benefit through [facility]. We do not take this for hearing aids.       Daryl Baker should return as recommended by physician for continued monitoring of hearing status.

## 2023-10-03 NOTE — Telephone Encounter
Referral to dental sent

## 2023-10-07 ENCOUNTER — Encounter: Admit: 2023-10-07 | Discharge: 2023-10-07 | Payer: MEDICARE

## 2023-10-08 ENCOUNTER — Encounter: Admit: 2023-10-08 | Discharge: 2023-10-08 | Payer: MEDICARE

## 2023-10-08 NOTE — Progress Notes
Social Work Sports coach Progress Note    Plan: Case collaboration    Intervention: In caseload coverage for primary SW Susie out of office, SW received update from SW Wellington D stating that PT will need assistance with [facility] referral once radiation treatment dates confirmed, is requesting radiation oncologist consider a [facility] caregiver exemption as PT would be not be coming with a caregiver. SW confirmed for Britt Boozer that currently CT SIM scheduled for 10/22/23 (might be pushed back depending on timing of dental work needed to be completed prior), but would not start radiation for about 2 weeks post 10/22/23 date.     SW sent MD, CNC update about patient request for [facility] caregiver exemption, if in support. Per CNC, MD is in support and will assist with form completion, once treatment dates are confirmed.     Next Steps: Radiation oncology SW will made outreach to PT/spouse when able to answer any additional questions about [facility] and to assist with stay request, once treatment dates known.     SW will continue to follow up as needed.    Daryl Baker, LMSW, APHSW-C  Social Work Case Education administrator Oncology / Proton Therapy  Office: [phone] / Vo[employer]  [email or web address]

## 2023-10-08 NOTE — Telephone Encounter
Incoming call from [place] with update that Jhalil has a dental appointment with  Dental on Thursday 1/16. Primary RN updated. No further needs/questions at this time.

## 2023-10-08 NOTE — Progress Notes
Plan:  Lodging     Intervention: SW received a call from pt's spouse Bonita Quin.  Bonita Quin asking about radiation appointments and [facility] referral.  SW will notify rad/onc SWs that pt/spouse have questions about radiation start date.  Spouse reported pt will need lodging.  Discussed [facility]; spouse reported pt will need a referral to [facility] however he will not have a caregiver.  Explained a provider will need to sign off on a Caregiver Exemption form indicating pt is safe to stay by himself at [facility].  Spouse reported she has not heard from anyone about a dental appointment.  SW reviewed pt's chart.  Explained dental referral to [facility] was made on 1/3.  SW provided phone # to [facility].   Spouse asked about home health if needed in the future for pt.  Explained pt's insurance will cover home health services for a skilled need (PT/OT/Skilled nursing).  Answered questions/provided support.  No other needs identified at this time.  SW will continue to provide support as needed.     SW e-mailed rad/onc SWs regarding radiation start date, [facility] referral and the need for [employer] form.     Samantha Crimes, LMSW

## 2023-10-08 NOTE — Telephone Encounter
Returned call to pt's wife, Bonita Quin who had phone on speaker for pt. They had received a call from IR on port placement and had questions before scheduling procedure. They are waiting for a call to schedule dental extractions. Answered all questions, both Bonita Quin and pt v/u. Bonita Quin will call IR # to scheduled port and has our office call back #.

## 2023-10-13 ENCOUNTER — Ambulatory Visit: Admit: 2023-10-13 | Discharge: 2023-10-13 | Payer: MEDICARE

## 2023-10-13 ENCOUNTER — Encounter: Admit: 2023-10-13 | Discharge: 2023-10-13 | Payer: MEDICARE

## 2023-10-13 NOTE — Progress Notes
Plan: Offer support/resources.    Intervention: SW reviewed chart and sent MyChart message to pt to introduce self and offer support/resources.    SW offered to assist with local lodging and other SW needs. SW included links for [employer] and [employer]. SW invited pt to [place] head and neck cancer support group.     SW included phone number and encouraged pt to contact SW if questions/needs.    Per communications, pt will need Caregiver Exemption for [facility].    SW will remain available to assist as needed.    Meribeth Mattes, LMSW  Social Work Case Manager  [phone]; Voalte Me

## 2023-10-14 ENCOUNTER — Encounter: Admit: 2023-10-14 | Discharge: 2023-10-14 | Payer: MEDICARE

## 2023-10-14 DIAGNOSIS — C109 Malignant neoplasm of oropharynx, unspecified: Secondary | ICD-10-CM

## 2023-10-16 ENCOUNTER — Encounter: Admit: 2023-10-16 | Discharge: 2023-10-16 | Payer: MEDICARE

## 2023-10-17 ENCOUNTER — Encounter: Admit: 2023-10-17 | Discharge: 2023-10-17 | Payer: MEDICARE

## 2023-10-17 NOTE — Progress Notes
Plan: Return spouse's call.    Intervention: SW returned pt's call. Pt has not listed anyone on his Patient Permission to Communicate form, so SW does not have his permission to communicate with her about his PHI.    Pt's spouse said she will give SW's phone number to pt and ask him to call.    Pt called SW and gave verbal permission to contact wife. SW expired current PPC form so pt can complete one with wife's name on it when he is on site.    SW called pt's wife, Daryl Baker, and provided details on [facility], including referral process and transportation. Daryl Baker will call SW for referral when first radiation tx is scheduled. SW will see Passenger transport manager from physician.    SW to remain available.      Daryl Baker, LMSW  Social Work Case Manager  [phone]; Voalte Me

## 2023-10-21 ENCOUNTER — Encounter: Admit: 2023-10-21 | Discharge: 2023-10-21 | Payer: MEDICARE

## 2023-10-21 NOTE — Progress Notes
Radiation Oncology Follow Up Note   Date: 10/22/2023       Daryl Baker is a 68 y.o. male.     The encounter diagnosis was Primary cancer of oropharynx (HCC).  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 We have recommended concurrent chemoradiation. He presents today for a second touch educational visit and consent prior to CT simulation.    Interim History:  He has lost weight over the past 6-7 months. He has had to alter his diet some, he is able to eat spaghetti but not steak.    Denies significant throat pain currently.    He is a Naval architect.    He has already had dental evaluation. He had dental extractions last week and had a lot of pain afterwards. He had some oxycodone at home, which he took to help manage the pain. It is somewhat better now and he is using tylenol only.    Has not taken BP meds today. Denies chest pain, headaches.    He lives away, plans to stay at [facility].     Review of Systems  As above     Objective:          FREESTYLE LIBRE 2 READER reader Use one each as directed every 6 hours.    losartan (COZAAR) 100 mg tablet Take one tablet by mouth daily.    metFORMIN-XR (GLUCOPHAGE XR) 750 mg extended release tablet Take one tablet by mouth daily with dinner.    metoprolol succinate XL (TOPROL XL) 100 mg extended release tablet Take one tablet by mouth daily. TRULICITY 0.75 mg/0.5 mL injection pen Inject 0.5 mL under the skin every 7 days.     Vitals:    10/22/23 1319   BP: (!) 188/114   Pulse: 105   Resp: 16   SpO2: 95%   PainSc: Zero   Weight: 109.8 kg (242 lb)     Body mass index is 34.72 kg/m?Marland Kitchen     Pain Score: Zero  Wt Readings from Last 5 Encounters:   10/22/23 109.8 kg (242 lb)   10/03/23 109.6 kg (241 lb 9.6 oz)   09/26/23 109.8 kg (242 lb)   09/04/23 109.8 kg (242 lb)   02/11/23 124.7 kg (275 lb)     PET 10/03/2023        CT Neck 08/27/2023      Fatigue Scale: 0-None    KARNOFSKY PERFORMANCE SCORE:  80% Normal activity with effort; some symptoms of disease     Physical Exam   General: Patient in no acute distress. Alert and oriented. Appears well.  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 We have recommended concurrent chemoradiation. He presents today for a second touch educational visit and consent prior to CT simulation.      We reviewed the logistics of radiotherapy including the need for CT simulation for the purposes of radiation treatment planning. The patient will require IMRT to allow for dose escalation to the tumor while allowing [place] to spare normal tissue. They will require the use of a personalized face mask, which will be made during CT simulation. His treatment will be on a daily basis, M-F, for 6-7 weeks.       Risks of treatment include skin changes, sore tongue, sore throat, focal alopecia, taste changes, dry/thick saliva, difficulty swallowing, fatigue, dry mouth, decreased appetite, nausea in the acute setting. In the long term, risks include gum disease, decreased mouth opening/trismus, dry mouth, tooth decay, jaw bone or cartilage necrosis, dysphagia and aspiration, damage to thyroid gland, nerve damage, tissue fibrosis.    In addition to explaining both the short- and long-term side effects of radiation treatment, I discussed in detail how these side effects are managed. I explained the timing of on-treatment visits and that the purpose of these visits is to help manage his symptoms. I provided information regarding ways to contact the team in-between these visits for issues that arise.     I then discussed the need for dietary, speech pathology and dental evaluation as a part of his head and neck cancer care. Social work is also available to meet with the patient. I explained that post-treatment disease surveillance will include regular short-term follow-up visits and interval imaging as well as routine flexible laryngoscopy as a part of clinical exam.     I have placed orders for video swallow and speech therapy referral for pre-treatment evaluation and education. I also placed a referral for a dietician.     The patient was given the opportunity to ask questions and have them answered to his satisfaction. Informed consent was obtained.     I discussed NavDX testing, which is a blood sample that tests for circulating tumor DNA. The patient's tumor pathology revealed p16 positivity and as such, I explained that we expect pre-treatment results to be positive. We use this as a baseline so that immediately following treatment we can re-test to ensure blood results are negative, I.e. we no longer detect circulating tumor DNA in the blood. Furthermore, it can help clarify disease status months to years following treatment when the patient is in surveillance. This is an outside laboratory test, so results will not be available for 7-10 days. The patient would like to proceed with testing.    - Pre tx Navdx drawn today  - He has swallow study on 2/4  - He is scheduled for first treatment on 2/3 per Dr. Neita Carp, weekly cisplatin  - Oxycodone sent    Thank you for allowing me to participate in the multidisciplinary care of this patient. Over 40 minutes of time was spent with the patient and their family focusing our discussion on the purpose and logistics of radiotherapy, expected side effects, and management of  the side-effects of radiation therapy, as well as goals of care and plans for disease surveillance.    Barrett Henle, DNP, FNP-C  Nurse Practitioner, Radiation Oncology  [facility]  Available on [place] and Teams

## 2023-10-22 ENCOUNTER — Encounter: Admit: 2023-10-22 | Discharge: 2023-10-22 | Payer: MEDICARE

## 2023-10-22 ENCOUNTER — Ambulatory Visit: Admit: 2023-10-22 | Discharge: 2023-10-22 | Payer: MEDICARE

## 2023-10-22 DIAGNOSIS — C109 Malignant neoplasm of oropharynx, unspecified: Secondary | ICD-10-CM

## 2023-10-22 MED ORDER — OXYCODONE 5 MG PO TAB
5-10 mg | ORAL_TABLET | ORAL | 0 refills | 6.00000 days | Status: AC | PRN
Start: 2023-10-22 — End: ?

## 2023-10-22 NOTE — Progress Notes
Plan: [facility] request.    Intervention: SW returned pt's wife's call regarding [facility] request. Pt now has a radiation start date.    SW completed request form with pt's wife. Pt was in the car with her and talked with SW.    Dates requested: 2/2 - 3/21.    SW stated she would get Passenger transport manager from [employer] and send request with exemption to [facility].    RN had earlier [employer] SW with signed exemption, but start date was incorrect. SW messaged RN to request corrected form.    Meribeth Mattes, LMSW  Social Work Case Manager  [phone]; Voalte Me

## 2023-10-23 ENCOUNTER — Encounter: Admit: 2023-10-23 | Discharge: 2023-10-23 | Payer: MEDICARE

## 2023-10-23 NOTE — Progress Notes
Plan: [facility] request.    Intervention: SW sent request and physician-signed Caregiver Exemption to [facility].    Meribeth Mattes, LMSW  Social Work Case Manager  [phone]; Voalte Me

## 2023-10-28 ENCOUNTER — Encounter: Admit: 2023-10-28 | Discharge: 2023-10-28 | Payer: MEDICARE

## 2023-10-28 NOTE — Progress Notes
Plan: [facility] received referral.    Intervention: SW received call from pt's wife saying they hadn't heard anything from [facility].    SW recommended pt's wife call [facility]. SW stated she would also confirm that [facility] received referral last week.    SW received confirmation from [facility] that referral was received and [facility] staff called pt to confirm stay and give info.      Meribeth Mattes, LMSW  Social Work Case Manager  [phone]; Voalte Me

## 2023-10-29 ENCOUNTER — Encounter: Admit: 2023-10-29 | Discharge: 2023-10-29 | Payer: MEDICARE

## 2023-10-29 NOTE — Telephone Encounter
RN reached out to [place] regarding incoming update/message about dry sockets. Relayed that [facility] should be calling today to get him back in tomorrow for re-evaluation/check in. Our team will be in touch tomorrow or Friday with an update on plan of care. He will be traveling in on Sunday to start radiation treatment 2/3.

## 2023-10-31 ENCOUNTER — Ambulatory Visit: Admit: 2023-10-31 | Discharge: 2023-10-31 | Payer: MEDICARE

## 2023-10-31 ENCOUNTER — Encounter: Admit: 2023-10-31 | Discharge: 2023-10-31 | Payer: MEDICARE

## 2023-10-31 DIAGNOSIS — C109 Malignant neoplasm of oropharynx, unspecified: Secondary | ICD-10-CM

## 2023-10-31 MED ORDER — DEXAMETHASONE 4 MG PO TAB
ORAL_TABLET | INTRAMUSCULAR | 0 refills | 12.50000 days | Status: AC
Start: 2023-10-31 — End: ?

## 2023-10-31 MED ORDER — OLANZAPINE 5 MG PO TAB
ORAL_TABLET | ORAL | 0 refills | 30.00000 days | Status: AC
Start: 2023-10-31 — End: ?

## 2023-10-31 MED ORDER — ONDANSETRON HCL 8 MG PO TAB
8 mg | ORAL_TABLET | ORAL | 0 refills | 8.00000 days | Status: AC | PRN
Start: 2023-10-31 — End: ?

## 2023-10-31 NOTE — Telephone Encounter
Confirmed Monday start with Viviann Spare. He is agreeable with plan.

## 2023-11-03 ENCOUNTER — Encounter: Admit: 2023-11-03 | Discharge: 2023-11-03 | Payer: MEDICARE

## 2023-11-03 ENCOUNTER — Encounter: Admit: 2023-11-03 | Discharge: 2023-11-04 | Payer: MEDICARE

## 2023-11-03 ENCOUNTER — Ambulatory Visit: Admit: 2023-11-03 | Discharge: 2023-11-03 | Payer: MEDICARE

## 2023-11-03 DIAGNOSIS — C109 Malignant neoplasm of oropharynx, unspecified: Secondary | ICD-10-CM

## 2023-11-03 LAB — CBC AND DIFF
~~LOC~~ BKR ABSOLUTE BASO COUNT: 0.1 10*3/uL (ref 0.00–0.20)
~~LOC~~ BKR ABSOLUTE EOS COUNT: 0 10*3/uL (ref 0.00–0.45)
~~LOC~~ BKR MCH: 33 pg — ABNORMAL HIGH (ref 26.0–34.0)
~~LOC~~ BKR MCHC: 34 g/dL (ref 32.0–36.0)
~~LOC~~ BKR MPV: 8.6 fL (ref 7.0–11.0)
~~LOC~~ BKR PLATELET COUNT: 168 10*3/uL (ref 150–400)
~~LOC~~ BKR RBC COUNT: 4.7 10*6/uL (ref 4.40–5.50)
~~LOC~~ BKR RDW: 15 % — ABNORMAL HIGH (ref 11.0–15.0)
~~LOC~~ BKR WBC COUNT: 5.5 10*3/uL (ref 4.50–11.00)

## 2023-11-03 LAB — COMPREHENSIVE METABOLIC PANEL
~~LOC~~ BKR ALBUMIN: 3.8 g/dL — ABNORMAL LOW (ref 3.5–5.0)
~~LOC~~ BKR ALK PHOSPHATASE: 72 U/L — ABNORMAL HIGH (ref 25–110)
~~LOC~~ BKR ALT: 15 U/L (ref 7–56)
~~LOC~~ BKR ANION GAP: 8 10*3/uL (ref 3–12)
~~LOC~~ BKR AST: 15 U/L (ref 7–40)
~~LOC~~ BKR CHLORIDE: 104 mmol/L (ref 98–110)
~~LOC~~ BKR CO2: 25 mmol/L (ref 21–30)
~~LOC~~ BKR GLOMERULAR FILTRATION RATE (GFR): >60 mL/min — ABNORMAL HIGH (ref >60–0.80)
~~LOC~~ BKR POTASSIUM: 3.7 mmol/L (ref 3.5–5.1)
~~LOC~~ BKR SODIUM, SERUM: 137 mmol/L (ref 137–147)
~~LOC~~ BKR TOTAL BILIRUBIN: 0.7 mg/dL (ref 0.2–1.3)

## 2023-11-03 MED ORDER — SODIUM CHLORIDE 0.9% IV SOLP
1000 mL | Freq: Once | INTRAVENOUS | 0 refills | Status: CP
Start: 2023-11-03 — End: ?
  Administered 2023-11-03: 18:00:00 1000 mL via INTRAVENOUS

## 2023-11-03 MED ORDER — PALONOSETRON 0.25 MG/5 ML IV SOLN
.25 mg | Freq: Once | INTRAVENOUS | 0 refills | Status: CP
Start: 2023-11-03 — End: ?
  Administered 2023-11-03: 16:00:00 0.25 mg via INTRAVENOUS

## 2023-11-03 MED ORDER — DEXAMETHASONE 6 MG PO TAB
12 mg | Freq: Once | ORAL | 0 refills | Status: CP
Start: 2023-11-03 — End: ?
  Administered 2023-11-03: 16:00:00 12 mg via ORAL

## 2023-11-03 MED ORDER — CISPLATIN IVPB
40 mg/m2 | Freq: Once | INTRAVENOUS | 0 refills | Status: CP
Start: 2023-11-03 — End: ?
  Administered 2023-11-03 (×2): 93.2 mg via INTRAVENOUS

## 2023-11-03 MED ORDER — MAGNESIUM SULFATE IN WATER 2 GRAM/50 ML (4 %) IV PGBK
2 g | Freq: Once | INTRAVENOUS | 0 refills | Status: CP
Start: 2023-11-03 — End: ?
  Administered 2023-11-03: 16:00:00 2 g via INTRAVENOUS

## 2023-11-03 MED ORDER — POTASSIUM CHLORIDE IN 0.9%NACL 20 MEQ/L IV SOLP
1000 mL | Freq: Once | INTRAVENOUS | 0 refills | Status: CP
Start: 2023-11-03 — End: ?
  Administered 2023-11-03: 16:00:00 1000 mL via INTRAVENOUS

## 2023-11-03 MED ORDER — APREPITANT 130 MG/18 ML (7.2 MG/ML) IV EMUL
130 mg | Freq: Once | INTRAVENOUS | 0 refills | Status: CP
Start: 2023-11-03 — End: ?
  Administered 2023-11-03: 16:00:00 130 mg via INTRAVENOUS

## 2023-11-03 NOTE — Progress Notes
Cancer Therapeutics Note  Verified consent signed and in chart.    Blood return positive via: Port (Single, Power Port, and Accessed)    BSA and dose double checked (agree with orders as written) with: yes see MAR    Labs/applicable tests checked: CBC and Comprehensive Metabolic Panel (CMP)    Treatment regimen: Drug/cycle/dayCISplatin (PLATINOL) 93.2 mg in sodium chloride 0.9% 343.2 mL IVPB     Rate verified and armband double check with second RN: yes    Patient education offered and stated understanding. Denies questions at this time.    Pt here for Cisplatin C1D1.  PAC flushed w NS, good blood return.  Pt tolerated infusion well.  Received pre- and post-hydration without difficulty.  Port flushed per protocol and deaccessed.   Pt left ambulatory, no complaints.

## 2023-11-03 NOTE — Telephone Encounter
NavDx Test Result    Collection Date: January 22nd, 2025  Sample Type: blood  Status: pre-treatment  Tumor Tissue Modified Viral (TTMV) - HPV DNA Score: Positive (18121)    NavDx Barcode: [id number]

## 2023-11-04 ENCOUNTER — Encounter: Admit: 2023-11-04 | Discharge: 2023-11-04 | Payer: MEDICARE

## 2023-11-04 ENCOUNTER — Ambulatory Visit: Admit: 2023-11-04 | Discharge: 2023-11-04 | Payer: MEDICARE

## 2023-11-04 ENCOUNTER — Ambulatory Visit: Admit: 2023-11-04 | Discharge: 2023-11-05 | Payer: MEDICARE

## 2023-11-04 ENCOUNTER — Encounter: Admit: 2023-11-04 | Discharge: 2023-11-28 | Payer: MEDICARE

## 2023-11-04 DIAGNOSIS — C109 Malignant neoplasm of oropharynx, unspecified: Secondary | ICD-10-CM

## 2023-11-04 MED ORDER — TRIAMCINOLONE ACETONIDE 0.1 % TP CREA
1 g | Freq: Two times a day (BID) | TOPICAL | 3 refills | Status: AC
Start: 2023-11-04 — End: ?
  Filled 2023-12-02: qty 80, 40d supply, fill #1

## 2023-11-04 MED ORDER — LIDOCAINE HCL 2 % MM SOLN
5 mL | Freq: Four times a day (QID) | ORAL | 3 refills | 30.00000 days | Status: AC | PRN
Start: 2023-11-04 — End: ?

## 2023-11-04 NOTE — Progress Notes
Weekly Management Progress Note    Date: 11/04/2023    Daryl Baker is a 68 y.o. male.     Vitals:  There were no vitals filed for this visit.  Subjective   There were no encounter diagnoses.  Staging:  Cancer Staging   Primary cancer of oropharynx (HCC)  Staging form: Pharynx - HPV-Mediated Oropharynx, AJCC 8th Edition  - Clinical stage from 10/14/2023: Stage II (cT3, cN2, cM0, p16+) - Signed by Monico Blitz, MD on 10/14/2023      There is no height or weight on file to calculate BMI.             Treatment Data Summary:          11/03/2023     3:13 PM 11/04/2023     1:10 PM   Treatment Data   Course ID C1 Oropharynx 25  C1 Oropharynx 25    Plan ID H&N  H&N    Prescription Dose (cGy) 6,996  6,996    Prescribed Dose per Fraction (Gy) 2.12  2.12    Fractions Treated to Date 1  2    Total Fractions on Plan 33  33    Treatment Elapsed Days 0  1    Reference Point ID Oropharynx/H&N  Oropharynx/H&N    Dosage Given to Date (Gy) 2.12  4.24         Subjective:  Pain after extractions. Controlled with oxycodone.     Objective:   Wt Readings from Last 5 Encounters:   11/03/23 113.8 kg (250 lb 12.8 oz)   10/22/23 109.8 kg (242 lb)   10/03/23 109.6 kg (241 lb 9.6 oz)   09/26/23 109.8 kg (242 lb)   09/04/23 109.8 kg (242 lb)     CBC w diff    Lab Results   Component Value Date/Time    WBC 5.50 11/03/2023 08:55 AM    RBC 4.71 11/03/2023 08:55 AM    HGB 15.5 11/03/2023 08:55 AM    HCT 44.9 11/03/2023 08:55 AM    MCV 95.4 11/03/2023 08:55 AM    MCH 33.0 11/03/2023 08:55 AM    MCHC 34.6 11/03/2023 08:55 AM    RDW 15.8 (H) 11/03/2023 08:55 AM    PLTCT 168 11/03/2023 08:55 AM    MPV 8.6 11/03/2023 08:55 AM    Lab Results   Component Value Date/Time    NEUT 50 11/03/2023 08:55 AM    ANC 2.70 11/03/2023 08:55 AM    LYMA 19 (L) 11/03/2023 08:55 AM    ALC 1.10 11/03/2023 08:55 AM    MONA 29 (H) 11/03/2023 08:55 AM    AMC 1.60 (H) 11/03/2023 08:55 AM    EOSA 1 11/03/2023 08:55 AM    AEC 0.00 11/03/2023 08:55 AM    BASA 1 11/03/2023 08:55 AM    ABC 0.10 11/03/2023 08:55 AM        Comprehensive Metabolic Profile    Lab Results   Component Value Date/Time    NA 137 11/03/2023 08:55 AM    K 3.7 11/03/2023 08:55 AM    CL 104 11/03/2023 08:55 AM    GLU 211 (H) 11/03/2023 08:55 AM    BUN 14 11/03/2023 08:55 AM    CR 0.71 11/03/2023 08:55 AM    CA 8.5 11/03/2023 08:55 AM    TOTPROT 6.5 11/03/2023 08:55 AM    Lab Results   Component Value Date/Time    TOTBILI 0.7 11/03/2023 08:55 AM    ALBUMIN 3.8 11/03/2023  08:55 AM    ALKPHOS 72 11/03/2023 08:55 AM    AST 15 11/03/2023 08:55 AM    CO2 25 11/03/2023 08:55 AM    ALT 15 11/03/2023 08:55 AM    GAP 8 11/03/2023 08:55 AM    EGFR1 >60 02/11/2023 11:43 AM         No mouth sores                Assessment: as above    Plan: continue current course.

## 2023-11-05 ENCOUNTER — Ambulatory Visit: Admit: 2023-11-05 | Discharge: 2023-11-05 | Payer: MEDICARE

## 2023-11-05 ENCOUNTER — Encounter: Admit: 2023-11-05 | Discharge: 2023-11-05 | Payer: MEDICARE

## 2023-11-05 NOTE — Progress Notes
Clinical Nutrition Assessment Summary    Daryl Baker is a 68 y.o. 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, AJCC 8th Edition  - Clinical stage from 10/14/2023: Stage II (cT3, cN2, cM0, p16+) -     Past Medical History: Reviewed    Current Treatment Plan: OP HEAD/NECK CISPLATIN (WEEKLY - 40MG /M2) + XRT     Nutrition Assessment of Patient:  Height: 177.8 cm (5' 10)  Weight: 114.3 kg (252 lb)  BMI (Calculated): 36.16  Desired Weight: 78 kg  Estimated Calorie Needs: 2340-2730 kcals/day (30-35 kcals/kg)  Estimated Protein Needs: 101-117 g/day (1.3-1.5 g/kg)    Wt Readings from Last 5 Encounters:   11/05/23 114.3 kg (252 lb)   11/04/23 114.7 kg (252 lb 13.9 oz)   11/03/23 113.8 kg (250 lb 12.8 oz)   10/22/23 109.8 kg (242 lb)   10/03/23 109.6 kg (241 lb 9.6 oz)        Body mass index is 36.16 kg/m?Marland Kitchen      Pt is eating well with appropriate appetite, no barriers to oral intake at this time. Reviewed elevated calorie and protein needs for tissue healing and weight maintenance on treatment. Explained risk of losing lean body mass with weight loss and how this can affect treatment plan overall. Nutrition impact side effect management discussed. Encouraged small, frequent meals and daily protein drinks or nutrition supplements. Recommend finding a high calorie and high protein supplement by week 3 of treatment, Boost VHC or equivalent. All questions answered during our visit today.      Physical Exam:  Loss of Subcutaneous Fat: No  Muscle Wasting: No            Malnutrition Details:  Does Not Meet Criteria                      Loss of Subcutaneous Fat: No      Muscle Wasting: No                        Pertinent Labs: BG: 211  Pertinent Meds/Supplements: Metformin, Zofran  Pertinent Food Allergies/Intolerance: NKFA    Nutrition Diagnosis:  Increased nutrient needs, specify:      Etiology: increased metabolic demand      Signs & Symptoms: side effect of treatment Intervention/Plan:  Counseled patient on role of nutrition in cancer and during treatment.  Educated patient on nutritional needs, protein recommendations, adequate hydration to maintain weight and strength.   Provided patient with list of foods high in protein. Reviewed dietary sources of protein, including meats, poultry, fish, eggs, milk, Greek yogurt, cottage cheese, soybeans and tofu, nuts/seeds/legumes, nut butters, oral supplements (powders and drinks). Encouraged inclusion of protein at all meals/snacks, discussed ideas including protein sources pt likes.  Provided healthy shake recipes/recommendations for ready to drink shakes. Encouraged use of homemade smoothies, potentially using oral supplement as base with additions of ice cream, nut butter, fruit, spoonful oil for more calories.  Encouraged small, frequent meals and snacks, every 2-3 hours throughout the day and choose nutrient dense, high protein foods.   Reviewed and provided patient with printed handouts, which discuss tips for managing common side effects of chemo and/or XRT.    Discussed tips for managing mouth sores and/or sore throat such as eating soft/moist foods, room temperature or cool foods, avoiding spicy/tart/citrus foods & alcohol, etc.  Also recommended patient obtain rx from physician for Magic Mouthwash if mouth sores persist.  RD will be available to patient while  undergoing treatment to assess for appetite and weight changes, along with chemo side effect management.   Discussed need for adequate hydration during treatments (2-3L fluids/day).  Patient was provided RD's contact information.  Will be available PRN.      Nutrition Monitoring and Evaluation:  Goal:< 10% wt loss starting weight (252 lbs)  Time Frame:Throughout Treatment and Recovery    The patient was allowed to ask questions and actively participated in creating plan of care. I provided the patient with my contact information and instructed pt on how to get in touch with me if questions or concerns arise. Thank you for allowing nutrition services to participate in this patients care.     Follow up Date: 2 weeks    Hart Robinsons, MS, RD, LD  Phone:[phone]

## 2023-11-06 ENCOUNTER — Ambulatory Visit: Admit: 2023-11-06 | Discharge: 2023-11-06 | Payer: MEDICARE

## 2023-11-06 ENCOUNTER — Encounter: Admit: 2023-11-06 | Discharge: 2023-11-06 | Payer: MEDICARE

## 2023-11-07 ENCOUNTER — Ambulatory Visit: Admit: 2023-11-07 | Discharge: 2023-11-07 | Payer: MEDICARE

## 2023-11-07 ENCOUNTER — Encounter: Admit: 2023-11-07 | Discharge: 2023-11-07 | Payer: MEDICARE

## 2023-11-10 ENCOUNTER — Encounter: Admit: 2023-11-10 | Discharge: 2023-11-10 | Payer: MEDICARE

## 2023-11-10 ENCOUNTER — Ambulatory Visit: Admit: 2023-11-10 | Discharge: 2023-11-10 | Payer: MEDICARE

## 2023-11-10 ENCOUNTER — Encounter: Admit: 2023-11-10 | Discharge: 2023-11-11 | Payer: MEDICARE

## 2023-11-10 DIAGNOSIS — C109 Malignant neoplasm of oropharynx, unspecified: Secondary | ICD-10-CM

## 2023-11-10 LAB — CBC AND DIFF
~~LOC~~ BKR ABSOLUTE BASO COUNT: 0 10*3/uL (ref 0.00–0.20)
~~LOC~~ BKR ABSOLUTE EOS COUNT: 0 10*3/uL (ref 0.00–0.45)
~~LOC~~ BKR ABSOLUTE MONO COUNT: 1.8 10*3/uL — ABNORMAL HIGH (ref 0.00–0.80)
~~LOC~~ BKR MCH: 33 pg — ABNORMAL HIGH (ref 26.0–34.0)
~~LOC~~ BKR MCHC: 34 g/dL (ref 32.0–36.0)
~~LOC~~ BKR RBC COUNT: 4.4 10*6/uL (ref 4.40–5.50)
~~LOC~~ BKR RDW: 15 % — ABNORMAL HIGH (ref 11.0–15.0)
~~LOC~~ BKR WBC COUNT: 6.8 10*3/uL (ref 4.50–11.00)

## 2023-11-10 LAB — COMPREHENSIVE METABOLIC PANEL
~~LOC~~ BKR ALBUMIN: 3.5 g/dL — ABNORMAL LOW (ref 3.5–5.0)
~~LOC~~ BKR ALK PHOSPHATASE: 68 U/L — ABNORMAL HIGH (ref 25–110)
~~LOC~~ BKR ALT: 20 U/L (ref 7–56)
~~LOC~~ BKR ANION GAP: 7 10*3/uL — ABNORMAL LOW (ref 3–12)
~~LOC~~ BKR AST: 14 U/L (ref 7–40)
~~LOC~~ BKR CALCIUM: 8 mg/dL — ABNORMAL LOW (ref 8.5–10.6)
~~LOC~~ BKR CHLORIDE: 105 mmol/L (ref 98–110)
~~LOC~~ BKR CO2: 23 mmol/L (ref 21–30)
~~LOC~~ BKR POTASSIUM: 4.1 mmol/L (ref 3.5–5.1)
~~LOC~~ BKR SODIUM, SERUM: 135 mmol/L — ABNORMAL LOW (ref 137–147)
~~LOC~~ BKR TOTAL BILIRUBIN: 0.8 mg/dL (ref 0.2–1.3)
~~LOC~~ BKR TOTAL PROTEIN: 5.9 g/dL — ABNORMAL LOW (ref 6.0–8.0)

## 2023-11-10 MED ORDER — CISPLATIN IVPB
40 mg/m2 | Freq: Once | INTRAVENOUS | 0 refills | Status: CP
Start: 2023-11-10 — End: ?
  Administered 2023-11-10 (×2): 93.2 mg via INTRAVENOUS

## 2023-11-10 MED ORDER — POTASSIUM CHLORIDE IN 0.9%NACL 20 MEQ/L IV SOLP
1000 mL | Freq: Once | INTRAVENOUS | 0 refills | Status: CP
Start: 2023-11-10 — End: ?
  Administered 2023-11-10: 16:00:00 1000 mL via INTRAVENOUS

## 2023-11-10 MED ORDER — ALTEPLASE 2 MG IK SOLR
2 mg | Freq: Once | INTRAMUSCULAR | 0 refills | Status: CP
Start: 2023-11-10 — End: ?
  Administered 2023-11-10: 14:00:00 2 mg via INTRAMUSCULAR

## 2023-11-10 MED ORDER — MAGNESIUM SULFATE IN WATER 2 GRAM/50 ML (4 %) IV PGBK
2 g | Freq: Once | INTRAVENOUS | 0 refills | Status: CP
Start: 2023-11-10 — End: ?
  Administered 2023-11-10: 16:00:00 2 g via INTRAVENOUS

## 2023-11-10 MED ORDER — DEXAMETHASONE 6 MG PO TAB
12 mg | Freq: Once | ORAL | 0 refills | Status: CP
Start: 2023-11-10 — End: ?
  Administered 2023-11-10: 16:00:00 12 mg via ORAL

## 2023-11-10 MED ORDER — PALONOSETRON 0.25 MG/5 ML IV SOLN
.25 mg | Freq: Once | INTRAVENOUS | 0 refills | Status: CP
Start: 2023-11-10 — End: ?
  Administered 2023-11-10: 16:00:00 0.25 mg via INTRAVENOUS

## 2023-11-10 MED ORDER — SODIUM CHLORIDE 0.9% IV SOLP
1000 mL | Freq: Once | INTRAVENOUS | 0 refills | Status: CP
Start: 2023-11-10 — End: ?
  Administered 2023-11-10: 18:00:00 1000 mL via INTRAVENOUS

## 2023-11-10 MED ORDER — APREPITANT 130 MG/18 ML (7.2 MG/ML) IV EMUL
130 mg | Freq: Once | INTRAVENOUS | 0 refills | Status: CP
Start: 2023-11-10 — End: ?
  Administered 2023-11-10: 16:00:00 130 mg via INTRAVENOUS

## 2023-11-10 NOTE — Progress Notes
Cancer Therapeutics Note  Verified consent signed and in chart.    Blood return positive via: Port (Single, Power Port, and Accessed)    BSA and dose double checked (agree with orders as written) with: yes see MAR    Labs/applicable tests checked: CBC and Comprehensive Metabolic Panel (CMP)    Treatment regimen: C1W2    CISplatin (PLATINOL) 93.2 mg in sodium chloride 0.9% 343.2 mL IVPB    Rate verified and armband double check with second RN: yes    Patient education offered and stated understanding. Denies questions at this time.    Patient arrived to CC treatment. VSS, port accessed, no blood returen and alteplase instilled, and assessment complete; please refer to doc flowsheet for details. No blood return at two hours. Due to time constraints for pt, port deaccessed and PIV placed for treatment today. Premeds given per treatment plan. Cisplatin given w/o complications, patient tolerated well.  PIV positive for blood return, flushed with saline, and removed. Patient declined copy of labs and AVS. All questions and concerns addressed. Patient left CC treatment in stable condition.

## 2023-11-11 ENCOUNTER — Encounter: Admit: 2023-11-11 | Discharge: 2023-11-11 | Payer: MEDICARE

## 2023-11-11 ENCOUNTER — Ambulatory Visit: Admit: 2023-11-11 | Discharge: 2023-11-11 | Payer: MEDICARE

## 2023-11-11 DIAGNOSIS — C109 Malignant neoplasm of oropharynx, unspecified: Secondary | ICD-10-CM

## 2023-11-11 MED ORDER — OXYCODONE 5 MG PO TAB
5-10 mg | ORAL_TABLET | ORAL | 0 refills | 6.00000 days | Status: AC | PRN
Start: 2023-11-11 — End: ?
  Filled 2023-11-11: qty 120, 10d supply, fill #1

## 2023-11-11 MED ORDER — AMOXICILLIN-POT CLAVULANATE 875-125 MG PO TAB
1 | ORAL_TABLET | Freq: Two times a day (BID) | ORAL | 0 refills | 7.00000 days | Status: AC
Start: 2023-11-11 — End: ?
  Filled 2023-11-11: qty 14, 7d supply, fill #1

## 2023-11-11 NOTE — Progress Notes
Weekly Management Progress Note    Date: 11/11/2023    Daryl Baker is a 68 y.o. male.     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  Staging:  Cancer Staging   Primary cancer of oropharynx (HCC)  Staging form: Pharynx - HPV-Mediated Oropharynx, AJCC 8th Edition  - Clinical stage from 10/14/2023: Stage II (cT3, cN2, cM0, p16+) - Signed by Monico Blitz, MD on 10/14/2023      Body mass index is 35.84 kg/m?Marland Kitchen  Pain Score: Zero     Fatigue Scale: 0-None    Treatment Data Summary:          11/03/2023     3:13 PM 11/04/2023     1:10 PM 11/05/2023     9:10 AM 11/06/2023     7:54 AM 11/07/2023     1:34 PM 11/10/2023     2:21 PM 11/11/2023     1:09 PM   Treatment Data   Course ID C1 Oropharynx 25  C1 Oropharynx 25  C1 Oropharynx 25  C1 Oropharynx 25  C1 Oropharynx 25  C1 Oropharynx 25  C1 Oropharynx 25    Plan ID H&N  H&N  H&N  H&N  H&N  H&N  H&N    Prescription Dose (cGy) 6,996  6,996  6,996  6,996  6,996  6,996  6,996    Prescribed Dose per Fraction (Gy) 2.12  2.12  2.12  2.12  2.12  2.12  2.12    Fractions Treated to Date 1  2  3  4  5  6  7     Total Fractions on Plan 33  33  33  33  33  33  33    Treatment Elapsed Days 0  1  2  3  4  7  8     Reference Point ID Oropharynx/H&N  Oropharynx/H&N  Oropharynx/H&N  Oropharynx/H&N  Oropharynx/H&N  Oropharynx/H&N  Oropharynx/H&N    Dosage Given to Date (Gy) 2.12  4.24  6.36  8.48  10.6  12.72  14.84         Subjective:  More dental pain.      Objective:   Wt Readings from Last 5 Encounters:   11/11/23 113.3 kg (249 lb 12.5 oz)   11/10/23 112 kg (247 lb)   11/05/23 114.3 kg (252 lb)   11/04/23 114.7 kg (252 lb 13.9 oz)   11/03/23 113.8 kg (250 lb 12.8 oz)     Gum looks inflamed by extraction site     Assessment: as above    Plan: continue current course. Will start antibiotics and renewed pain medication.

## 2023-11-12 ENCOUNTER — Encounter: Admit: 2023-11-12 | Discharge: 2023-11-12 | Payer: MEDICARE

## 2023-11-12 ENCOUNTER — Ambulatory Visit: Admit: 2023-11-12 | Discharge: 2023-11-12 | Payer: MEDICARE

## 2023-11-13 ENCOUNTER — Encounter: Admit: 2023-11-13 | Discharge: 2023-11-13 | Payer: MEDICARE

## 2023-11-13 ENCOUNTER — Ambulatory Visit: Admit: 2023-11-13 | Discharge: 2023-11-13 | Payer: MEDICARE

## 2023-11-14 ENCOUNTER — Encounter: Admit: 2023-11-14 | Discharge: 2023-11-14 | Payer: MEDICARE

## 2023-11-14 ENCOUNTER — Ambulatory Visit: Admit: 2023-11-14 | Discharge: 2023-11-14 | Payer: MEDICARE

## 2023-11-14 NOTE — Telephone Encounter
 Pain were tooth was extracted continues to get worse, sensitive to temperatures, chewing even on other side is limiting his intake and speaking is causing pain. He now noticed increased swelling. Has started abx, taking 5mg  oxycodone q4 during the day and using lidocaine. He is supplementing intake with boost.    Plan: increase oxycodone 10mg  Q4, start alternating between tylenol and ibuprofen.

## 2023-11-17 ENCOUNTER — Encounter: Admit: 2023-11-17 | Discharge: 2023-11-17 | Payer: MEDICARE

## 2023-11-17 ENCOUNTER — Encounter: Admit: 2023-11-17 | Discharge: 2023-11-18 | Payer: MEDICARE

## 2023-11-17 ENCOUNTER — Ambulatory Visit: Admit: 2023-11-17 | Discharge: 2023-11-17 | Payer: MEDICARE

## 2023-11-17 DIAGNOSIS — C109 Malignant neoplasm of oropharynx, unspecified: Secondary | ICD-10-CM

## 2023-11-17 LAB — MAGNESIUM: ~~LOC~~ BKR MAGNESIUM: 1.7 mg/dL (ref 1.6–2.6)

## 2023-11-17 LAB — COMPREHENSIVE METABOLIC PANEL
~~LOC~~ BKR ALBUMIN: 3.6 g/dL — ABNORMAL LOW (ref 3.5–5.0)
~~LOC~~ BKR ALK PHOSPHATASE: 70 U/L — ABNORMAL HIGH (ref 25–110)
~~LOC~~ BKR ALT: 17 U/L (ref 7–56)
~~LOC~~ BKR ANION GAP: 7 10*3/uL — ABNORMAL LOW (ref 3–12)
~~LOC~~ BKR AST: 12 U/L (ref 7–40)
~~LOC~~ BKR BLD UREA NITROGEN: 10 mg/dL (ref 7–25)
~~LOC~~ BKR CALCIUM: 8.4 mg/dL — ABNORMAL LOW (ref 8.5–10.6)
~~LOC~~ BKR CO2: 27 mmol/L (ref 21–30)
~~LOC~~ BKR CREATININE: 0.7 mg/dL — ABNORMAL HIGH (ref 0.40–1.24)
~~LOC~~ BKR GLOMERULAR FILTRATION RATE (GFR): >60 mL/min — ABNORMAL HIGH (ref >60–0.80)
~~LOC~~ BKR TOTAL BILIRUBIN: 0.9 mg/dL (ref 0.2–1.3)

## 2023-11-17 LAB — CBC AND DIFF
~~LOC~~ BKR ABSOLUTE BASO COUNT: 0 10*3/uL (ref 0.00–0.20)
~~LOC~~ BKR ABSOLUTE EOS COUNT: 0 10*3/uL (ref 0.00–0.45)
~~LOC~~ BKR HEMATOCRIT: 39 % — ABNORMAL LOW (ref 40.0–50.0)
~~LOC~~ BKR HEMOGLOBIN: 13 g/dL — ABNORMAL LOW (ref 13.5–16.5)
~~LOC~~ BKR MCH: 33 pg — ABNORMAL HIGH (ref 26.0–34.0)
~~LOC~~ BKR MPV: 8.9 fL (ref 7.0–11.0)
~~LOC~~ BKR RBC COUNT: 4 10*6/uL — ABNORMAL LOW (ref 4.40–5.50)
~~LOC~~ BKR WBC COUNT: 6.2 10*3/uL (ref 4.50–11.00)

## 2023-11-17 MED ORDER — SODIUM CHLORIDE 0.9% IV SOLP
1000 mL | Freq: Once | INTRAVENOUS | 0 refills | Status: CP
Start: 2023-11-17 — End: ?
  Administered 2023-11-17: 17:00:00 1000 mL via INTRAVENOUS

## 2023-11-17 MED ORDER — MAGNESIUM SULFATE IN WATER 2 GRAM/50 ML (4 %) IV PGBK
2 g | Freq: Once | INTRAVENOUS | 0 refills | Status: CP
Start: 2023-11-17 — End: ?
  Administered 2023-11-17: 15:00:00 2 g via INTRAVENOUS

## 2023-11-17 MED ORDER — PALONOSETRON 0.25 MG/5 ML IV SOLN
.25 mg | Freq: Once | INTRAVENOUS | 0 refills | Status: CP
Start: 2023-11-17 — End: ?
  Administered 2023-11-17: 15:00:00 0.25 mg via INTRAVENOUS

## 2023-11-17 MED ORDER — DEXAMETHASONE 6 MG PO TAB
12 mg | Freq: Once | ORAL | 0 refills | Status: CP
Start: 2023-11-17 — End: ?
  Administered 2023-11-17: 15:00:00 12 mg via ORAL

## 2023-11-17 MED ORDER — APREPITANT 130 MG/18 ML (7.2 MG/ML) IV EMUL
130 mg | Freq: Once | INTRAVENOUS | 0 refills | Status: CP
Start: 2023-11-17 — End: ?
  Administered 2023-11-17: 15:00:00 130 mg via INTRAVENOUS

## 2023-11-17 MED ORDER — LIDOCAINE HCL 2 % MM SOLN
5 mL | Freq: Four times a day (QID) | ORAL | 3 refills | 30.00000 days | Status: AC | PRN
Start: 2023-11-17 — End: ?
  Filled 2023-11-18: qty 100, 5d supply, fill #1

## 2023-11-17 MED ORDER — TRIAMCINOLONE ACETONIDE 0.1 % TP CREA
1 g | Freq: Two times a day (BID) | TOPICAL | 3 refills | Status: CN
Start: 2023-11-17 — End: ?

## 2023-11-17 MED ORDER — POTASSIUM CHLORIDE IN 0.9%NACL 20 MEQ/L IV SOLP
1000 mL | Freq: Once | INTRAVENOUS | 0 refills | Status: CP
Start: 2023-11-17 — End: ?
  Administered 2023-11-17: 15:00:00 1000 mL via INTRAVENOUS

## 2023-11-17 MED ORDER — CISPLATIN IVPB
40 mg/m2 | Freq: Once | INTRAVENOUS | 0 refills | Status: CP
Start: 2023-11-17 — End: ?
  Administered 2023-11-17 (×2): 93.2 mg via INTRAVENOUS

## 2023-11-17 NOTE — Progress Notes
 Cancer Therapeutics Note  Verified consent signed and in chart. yes    Blood return positive via: Port (Single)    BSA and dose double checked (agree with orders as written) with: yes     Labs/applicable tests checked: CBC and Comprehensive Metabolic Panel (CMP)    Treatment regimen: Drug/cycle/day Week 3 C 1 Cisplatin     Rate verified and armband double check with second RN: yes    Patient education offered and stated understanding. Denies questions at this time.     Pac accessed with + blood return. Labs drawn as ordered. Premeds given and pre hydration and Mag infusing. Infusion completed. Cisplatin with post hydration given and completed. Pac flushed with saline and deaccessed. Pt left tx area in stable condition

## 2023-11-17 NOTE — Progress Notes
 Seen for oral pain. Socket seen at location of tooth 18 that was extracted. Small bony prominence, tender to touch, felt were tooth 19 was extracted. Daryl Baker is willing to be seen at [facility] dental, message sent to Dr. Wendi Snipes office, they will work on getting I'm in this week.

## 2023-11-18 ENCOUNTER — Encounter: Admit: 2023-11-18 | Discharge: 2023-11-18 | Payer: MEDICARE

## 2023-11-18 ENCOUNTER — Ambulatory Visit: Admit: 2023-11-18 | Discharge: 2023-11-18 | Payer: MEDICARE

## 2023-11-18 DIAGNOSIS — C109 Malignant neoplasm of oropharynx, unspecified: Secondary | ICD-10-CM

## 2023-11-18 MED ORDER — MORPHINE 15 MG PO TBER
15 mg | ORAL_TABLET | Freq: Two times a day (BID) | ORAL | 0 refills | 7.00000 days | Status: AC
Start: 2023-11-18 — End: ?
  Filled 2023-11-18: qty 60, 30d supply, fill #1

## 2023-11-18 NOTE — Telephone Encounter
 Provided [place] dental office number.

## 2023-11-18 NOTE — Progress Notes
 Weekly Management Progress Note    Date: 11/18/2023    Daryl Baker is a 68 y.o. male.     Vitals:  Vitals:    11/18/23 0746   BP: (!) 151/65   BP Source: Arm, Left Upper   Pulse: 99   Temp: 36.5 ?C (97.7 ?F)   Resp: 14   SpO2: 98%   TempSrc: Temporal   PainSc: Eight   Weight: 114.2 kg (251 lb 12.3 oz)   Height: 180.3 cm (5' 11)     Subjective   There were no encounter diagnoses.  Staging:  Cancer Staging   Primary cancer of oropharynx (HCC)  Staging form: Pharynx - HPV-Mediated Oropharynx, AJCC 8th Edition  - Clinical stage from 10/14/2023: Stage II (cT3, cN2, cM0, p16+) - Signed by Monico Blitz, MD on 10/14/2023      Body mass index is 35.11 kg/m?Marland Kitchen  Pain Score: Eight  Pain Loc: Mouth  Fatigue Scale: 0-None    Treatment Data Summary:          11/10/2023     2:21 PM 11/11/2023     1:09 PM 11/12/2023     8:09 AM 11/13/2023     7:51 AM 11/14/2023     7:54 AM 11/17/2023     2:27 PM 11/18/2023     7:36 AM   Treatment Data   Course ID C1 Oropharynx 25  C1 Oropharynx 25  C1 Oropharynx 25  C1 Oropharynx 25  C1 Oropharynx 25  C1 Oropharynx 25  C1 Oropharynx 25    Plan ID H&N  H&N  H&N  H&N  H&N  H&N  H&N    Prescription Dose (cGy) 6,996  6,996  6,996  6,996  6,996  6,996  6,996    Prescribed Dose per Fraction (Gy) 2.12  2.12  2.12  2.12  2.12  2.12  2.12    Fractions Treated to Date 6  7  8  9  10  11  12     Total Fractions on Plan 33  33  33  33  33  33  33    Treatment Elapsed Days 7  8  9  10  11  14  15     Reference Point ID Oropharynx/H&N  Oropharynx/H&N  Oropharynx/H&N  Oropharynx/H&N  Oropharynx/H&N  Oropharynx/H&N  Oropharynx/H&N    Dosage Given to Date (Gy) 12.72  14.84  16.96  19.08  21.2  23.32  25.44         Subjective:  Oral pain with slivers of bone on left.   Using lidocaine, taking oxycodone, wakes up again at 2 pm. Bowels plugged.     Objective:   Wt Readings from Last 5 Encounters:   11/18/23 114.2 kg (251 lb 12.3 oz)   11/17/23 111.4 kg (245 lb 9.6 oz)   11/11/23 113.3 kg (249 lb 12.5 oz) 11/10/23 112 kg (247 lb)   11/05/23 114.3 kg (252 lb)     Left mandible bone sliver     Assessment: as above    Plan: Get into dental to see about debriding bone. Add MS Contin for pain. Start miralax for bowels.

## 2023-11-19 ENCOUNTER — Encounter: Admit: 2023-11-19 | Discharge: 2023-11-19 | Payer: MEDICARE

## 2023-11-19 ENCOUNTER — Ambulatory Visit: Admit: 2023-11-19 | Discharge: 2023-11-19 | Payer: MEDICARE

## 2023-11-20 ENCOUNTER — Ambulatory Visit: Admit: 2023-11-20 | Discharge: 2023-11-20 | Payer: MEDICARE

## 2023-11-20 ENCOUNTER — Encounter: Admit: 2023-11-20 | Discharge: 2023-11-20 | Payer: MEDICARE

## 2023-11-21 ENCOUNTER — Ambulatory Visit: Admit: 2023-11-21 | Discharge: 2023-11-21 | Payer: MEDICARE

## 2023-11-21 ENCOUNTER — Encounter: Admit: 2023-11-21 | Discharge: 2023-11-21 | Payer: MEDICARE

## 2023-11-21 MED FILL — LIDOCAINE HCL 2 % MM SOLN: 2 % | ORAL | 5 days supply | Qty: 100 | Fill #2 | Status: CP

## 2023-11-22 ENCOUNTER — Encounter: Admit: 2023-11-22 | Discharge: 2023-11-22 | Payer: MEDICARE

## 2023-11-24 ENCOUNTER — Ambulatory Visit: Admit: 2023-11-24 | Discharge: 2023-11-24 | Payer: MEDICARE

## 2023-11-24 ENCOUNTER — Encounter: Admit: 2023-11-24 | Discharge: 2023-11-24 | Payer: MEDICARE

## 2023-11-24 ENCOUNTER — Encounter: Admit: 2023-11-24 | Discharge: 2023-11-25 | Payer: MEDICARE

## 2023-11-24 DIAGNOSIS — C109 Malignant neoplasm of oropharynx, unspecified: Secondary | ICD-10-CM

## 2023-11-24 LAB — CBC AND DIFF
~~LOC~~ BKR ABSOLUTE BASO COUNT: 0 10*3/uL (ref 0.00–0.20)
~~LOC~~ BKR ABSOLUTE EOS COUNT: 0 10*3/uL (ref 0.00–0.45)
~~LOC~~ BKR ABSOLUTE LYMPH COUNT: 1.1 10*3/uL (ref 1.00–4.80)
~~LOC~~ BKR ABSOLUTE MONO COUNT: 1.5 10*3/uL — ABNORMAL HIGH (ref 0.00–0.80)
~~LOC~~ BKR ABSOLUTE NEUTROPHIL: 4.3 10*3/uL (ref 1.80–7.00)
~~LOC~~ BKR BASOPHILS %: 0 % (ref 0–2)
~~LOC~~ BKR EOSINOPHILS %: 0 % (ref 0–5)
~~LOC~~ BKR HEMATOCRIT: 37 % — ABNORMAL LOW (ref 40.0–50.0)
~~LOC~~ BKR HEMOGLOBIN: 13 g/dL — ABNORMAL LOW (ref 13.5–16.5)
~~LOC~~ BKR LYMPHOCYTES %: 15 % — ABNORMAL LOW (ref 24–44)
~~LOC~~ BKR MCH: 33 pg (ref 26.0–34.0)
~~LOC~~ BKR MCHC: 34 g/dL (ref 32.0–36.0)
~~LOC~~ BKR MCV: 95 fL (ref 80.0–100.0)
~~LOC~~ BKR MONOCYTES %: 22 % — ABNORMAL HIGH (ref 4–12)
~~LOC~~ BKR MPV: 8.8 fL (ref 7.0–11.0)
~~LOC~~ BKR NEUTROPHILS %: 62 % (ref 41–77)
~~LOC~~ BKR PLATELET COUNT: 147 10*3/uL — ABNORMAL LOW (ref 150–400)
~~LOC~~ BKR RBC COUNT: 3.9 10*6/uL — ABNORMAL LOW (ref 4.40–5.50)
~~LOC~~ BKR RDW: 15 % (ref 11.0–15.0)
~~LOC~~ BKR WBC COUNT: 6.9 10*3/uL (ref 4.50–11.00)

## 2023-11-24 LAB — COMPREHENSIVE METABOLIC PANEL
~~LOC~~ BKR ALBUMIN: 3.8 g/dL (ref 3.5–5.0)
~~LOC~~ BKR ALK PHOSPHATASE: 63 U/L (ref 25–110)
~~LOC~~ BKR ALT: 17 U/L (ref 7–56)
~~LOC~~ BKR ANION GAP: 8 (ref 3–12)
~~LOC~~ BKR AST: 14 U/L (ref 7–40)
~~LOC~~ BKR BLD UREA NITROGEN: 19 mg/dL (ref 7–25)
~~LOC~~ BKR CALCIUM: 8.4 mg/dL — ABNORMAL LOW (ref 8.5–10.6)
~~LOC~~ BKR CHLORIDE: 100 mmol/L (ref 98–110)
~~LOC~~ BKR CO2: 26 mmol/L (ref 21–30)
~~LOC~~ BKR CREATININE: 0.7 mg/dL (ref 0.40–1.24)
~~LOC~~ BKR GLOMERULAR FILTRATION RATE (GFR): >60 mL/min (ref >60)
~~LOC~~ BKR GLUCOSE, RANDOM: 128 mg/dL — ABNORMAL HIGH (ref 70–100)
~~LOC~~ BKR POTASSIUM: 4.3 mmol/L (ref 3.5–5.1)
~~LOC~~ BKR SODIUM, SERUM: 134 mmol/L — ABNORMAL LOW (ref 137–147)
~~LOC~~ BKR TOTAL BILIRUBIN: 0.8 mg/dL (ref 0.2–1.3)
~~LOC~~ BKR TOTAL PROTEIN: 6.3 g/dL (ref 6.0–8.0)

## 2023-11-24 MED ORDER — POTASSIUM CHLORIDE IN 0.9%NACL 20 MEQ/L IV SOLP
1000 mL | Freq: Once | INTRAVENOUS | 0 refills | Status: CP
Start: 2023-11-24 — End: ?
  Administered 2023-11-24: 15:00:00 1000 mL via INTRAVENOUS

## 2023-11-24 MED ORDER — PALONOSETRON 0.25 MG/5 ML IV SOLN
.25 mg | Freq: Once | INTRAVENOUS | 0 refills | Status: CP
Start: 2023-11-24 — End: ?
  Administered 2023-11-24: 15:00:00 0.25 mg via INTRAVENOUS

## 2023-11-24 MED ORDER — APREPITANT 130 MG/18 ML (7.2 MG/ML) IV EMUL
130 mg | Freq: Once | INTRAVENOUS | 0 refills | Status: CP
Start: 2023-11-24 — End: ?
  Administered 2023-11-24: 15:00:00 130 mg via INTRAVENOUS

## 2023-11-24 MED ORDER — CISPLATIN IVPB
40 mg/m2 | Freq: Once | INTRAVENOUS | 0 refills | Status: CP
Start: 2023-11-24 — End: ?
  Administered 2023-11-24 (×2): 93.2 mg via INTRAVENOUS

## 2023-11-24 MED ORDER — ALTEPLASE 2 MG IK SOLR
2 mg | Freq: Once | INTRAMUSCULAR | 0 refills | Status: CP
Start: 2023-11-24 — End: ?
  Administered 2023-11-24: 14:00:00 2 mg via INTRAMUSCULAR

## 2023-11-24 MED ORDER — SODIUM CHLORIDE 0.9% IV SOLP
1000 mL | Freq: Once | INTRAVENOUS | 0 refills | Status: CP
Start: 2023-11-24 — End: ?
  Administered 2023-11-24: 17:00:00 1000 mL via INTRAVENOUS

## 2023-11-24 MED ORDER — MAGNESIUM SULFATE IN WATER 2 GRAM/50 ML (4 %) IV PGBK
2 g | Freq: Once | INTRAVENOUS | 0 refills | Status: CP
Start: 2023-11-24 — End: ?
  Administered 2023-11-24: 15:00:00 2 g via INTRAVENOUS

## 2023-11-24 MED ORDER — DEXAMETHASONE 6 MG PO TAB
12 mg | Freq: Once | ORAL | 0 refills | Status: CP
Start: 2023-11-24 — End: ?
  Administered 2023-11-24: 15:00:00 12 mg via ORAL

## 2023-11-24 NOTE — Progress Notes
 Clinical Nutrition Assessment Summary    Daryl Baker is a 68 y.o. 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      RD spoke with pt on phone today.  Pt reports continues to do well with nutrition and has no needs today. Pt reports no wt loss not even down 2 lbs however per chart review possible 5-7 lbs wt loss (3%).  RD will follow.           Pertinent Labs: BG: 211  Pertinent Meds/Supplements: Metformin, Zofran  Pertinent Food Allergies/Intolerance: NKFA        Nutrition Monitoring and Evaluation:  Goal:< 10% wt loss starting weight (245 lbs)  Time Frame:Throughout Treatment and Recovery  -7lbs ( 3%)     The patient was allowed to ask questions and actively participated in creating plan of care. I provided the patient with my contact information and instructed pt on how to get in touch with me if questions or concerns arise. Thank you for allowing nutrition services to participate in this patients care.     Follow up Date: 1 weeks    Hart Robinsons, MS, RD, LD  Phone:[phone]

## 2023-11-24 NOTE — Progress Notes
 Cancer Therapeutics Note  Verified consent signed and in chart.    Blood return positive via: Peripheral (22 ga)    BSA and dose double checked (agree with orders as written) with: yes Alvin Critchley, RN    Labs/applicable tests checked: CBC and Comprehensive Metabolic Panel (CMP)    Treatment regimen: W4C1    CISplatin (PLATINOL) 93.2 mg in sodium chloride 0.9% 343.2 mL IVPB     Rate verified and armband double check with second RN: yes    Patient education offered and stated understanding. Denies questions at this time.    Patient arrived to CC treatment. VSS, port accessed, and assessment complete; please refer to doc flowsheet for details. No blood return from port- alteplase placed in line. PIV placed. Port not used for treatment. Premeds and hydration given per treatment plan. Cisplatin given w/o complications, patient tolerated well. PIV positive for blood return, flushed with saline, and removed per protocol. Port flushed with saline, positive for blood return, and deaccessed per protocol. Patient declined copy of labs and AVS. All questions and concerns addressed. Patient left CC treatment in stable condition.

## 2023-11-25 ENCOUNTER — Encounter: Admit: 2023-11-25 | Discharge: 2023-11-25 | Payer: MEDICARE

## 2023-11-25 ENCOUNTER — Ambulatory Visit: Admit: 2023-11-25 | Discharge: 2023-11-25 | Payer: MEDICARE

## 2023-11-25 DIAGNOSIS — C109 Malignant neoplasm of oropharynx, unspecified: Secondary | ICD-10-CM

## 2023-11-25 MED ORDER — MORPHINE 30 MG PO TBER
30 mg | ORAL_TABLET | Freq: Two times a day (BID) | ORAL | 0 refills | 7.00000 days | Status: AC
Start: 2023-11-25 — End: ?
  Filled 2023-11-25: qty 60, 30d supply, fill #1

## 2023-11-25 MED ORDER — LIDOCAINE HCL 2 % MM SOLN
5-10 mL | Freq: Four times a day (QID) | ORAL | 3 refills | 30.00000 days | Status: AC | PRN
Start: 2023-11-25 — End: ?
  Filled 2023-11-25: qty 300, 8d supply, fill #1

## 2023-11-25 NOTE — Progress Notes
 Weekly Management Progress Note    Date: 11/25/2023    Daryl Baker is a 68 y.o. male.     Vitals:  Vitals:    11/25/23 0801 11/25/23 0803   BP: (!) 146/91    Pulse: 72    Resp: 18    SpO2: 99%    Weight:  111 kg (244 lb 11.4 oz)     Subjective   There were no encounter diagnoses.  Staging:  Cancer Staging   Primary cancer of oropharynx (HCC)  Staging form: Pharynx - HPV-Mediated Oropharynx, AJCC 8th Edition  - Clinical stage from 10/14/2023: Stage II (cT3, cN2, cM0, p16+) - Signed by Monico Blitz, MD on 10/14/2023      Body mass index is 34.13 kg/m?Marland Kitchen             Treatment Data Summary:          11/17/2023     2:27 PM 11/18/2023     7:36 AM 11/19/2023     7:20 AM 11/20/2023     7:24 AM 11/21/2023     7:16 AM 11/24/2023     2:22 PM 11/25/2023     7:40 AM   Treatment Data   Course ID C1 Oropharynx 25  C1 Oropharynx 25  C1 Oropharynx 25  C1 Oropharynx 25  C1 Oropharynx 25  C1 Oropharynx 25  C1 Oropharynx 25    Plan ID H&N  H&N  H&N  H&N  H&N  H&N  H&N    Prescription Dose (cGy) 6,996  6,996  6,996  6,996  6,996  6,996  6,996    Prescribed Dose per Fraction (Gy) 2.12  2.12  2.12  2.12  2.12  2.12  2.12    Fractions Treated to Date 11  12  13  14  15  16  17     Total Fractions on Plan 33  33  33  33  33  33  33    Treatment Elapsed Days 14  15  16  17  18  21  22     Reference Point ID Oropharynx/H&N  Oropharynx/H&N  Oropharynx/H&N  Oropharynx/H&N  Oropharynx/H&N  Oropharynx/H&N  Oropharynx/H&N    Dosage Given to Date (Gy) 23.32  25.44  27.56  29.68  31.8  33.92  36.04         Subjective:  Pain is 4-5/10. Mostly with tooth. Magic bug juice helping. Making 10 mg every 4 hours of oxycodone and MS Contin 15 BID. Bowels not regular.     Objective:   Wt Readings from Last 5 Encounters:   11/25/23 111 kg (244 lb 11.4 oz)   11/24/23 108.3 kg (238 lb 12.8 oz)   11/18/23 114.2 kg (251 lb 12.3 oz)   11/17/23 111.4 kg (245 lb 9.6 oz)   11/11/23 113.3 kg (249 lb 12.5 oz)     CBC w diff    Lab Results   Component Value Date/Time    WBC 6.90 11/24/2023 07:57 AM    RBC 3.97 (L) 11/24/2023 07:57 AM    HGB 13.2 (L) 11/24/2023 07:57 AM    HCT 37.8 (L) 11/24/2023 07:57 AM    MCV 95.3 11/24/2023 07:57 AM    MCH 33.3 11/24/2023 07:57 AM    MCHC 34.9 11/24/2023 07:57 AM    RDW 15.0 11/24/2023 07:57 AM    PLTCT 147 (L) 11/24/2023 07:57 AM    MPV 8.8 11/24/2023 07:57 AM    Lab Results   Component Value  Date/Time    NEUT 62 11/24/2023 07:57 AM    ANC 4.30 11/24/2023 07:57 AM    LYMA 15 (L) 11/24/2023 07:57 AM    ALC 1.10 11/24/2023 07:57 AM    MONA 22 (H) 11/24/2023 07:57 AM    AMC 1.50 (H) 11/24/2023 07:57 AM    EOSA 0 11/24/2023 07:57 AM    AEC 0.00 11/24/2023 07:57 AM    BASA 0 11/24/2023 07:57 AM    ABC 0.00 11/24/2023 07:57 AM          Comprehensive Metabolic Profile    Lab Results   Component Value Date/Time    NA 134 (L) 11/24/2023 07:34 AM    K 4.3 11/24/2023 07:34 AM    CL 100 11/24/2023 07:34 AM    GLU 128 (H) 11/24/2023 07:34 AM    BUN 19 11/24/2023 07:34 AM    CR 0.74 11/24/2023 07:34 AM    CA 8.4 (L) 11/24/2023 07:34 AM    TOTPROT 6.3 11/24/2023 07:34 AM    Lab Results   Component Value Date/Time    TOTBILI 0.8 11/24/2023 07:34 AM    ALBUMIN 3.8 11/24/2023 07:34 AM    ALKPHOS 63 11/24/2023 07:34 AM    AST 14 11/24/2023 07:34 AM    CO2 26 11/24/2023 07:34 AM    ALT 17 11/24/2023 07:34 AM    GAP 8 11/24/2023 07:34 AM    EGFR1 >60 02/11/2023 11:43 AM               Skin intact         Assessment: constipation    Plan: start miralax

## 2023-11-26 ENCOUNTER — Encounter: Admit: 2023-11-26 | Discharge: 2023-11-26 | Payer: MEDICARE

## 2023-11-26 ENCOUNTER — Ambulatory Visit: Admit: 2023-11-26 | Discharge: 2023-11-26 | Payer: MEDICARE

## 2023-11-26 DIAGNOSIS — C109 Malignant neoplasm of oropharynx, unspecified: Secondary | ICD-10-CM

## 2023-11-26 MED ORDER — OLANZAPINE 5 MG PO TAB
ORAL_TABLET | ORAL | 0 refills | 30.00000 days | Status: AC
Start: 2023-11-26 — End: ?
  Filled 2023-12-02: qty 24, 24d supply, fill #1

## 2023-11-26 MED ORDER — DEXAMETHASONE 4 MG PO TAB
ORAL_TABLET | INTRAMUSCULAR | 0 refills | 12.50000 days | Status: AC
Start: 2023-11-26 — End: ?
  Filled 2023-12-02: qty 36, 18d supply, fill #1

## 2023-11-26 MED ORDER — DEXAMETHASONE 4 MG PO TAB
ORAL_TABLET | 0 refills
Start: 2023-11-26 — End: ?

## 2023-11-26 MED ORDER — OLANZAPINE 5 MG PO TAB
ORAL_TABLET | 0 refills
Start: 2023-11-26 — End: ?

## 2023-11-27 ENCOUNTER — Encounter: Admit: 2023-11-27 | Discharge: 2023-11-27 | Payer: MEDICARE

## 2023-11-27 ENCOUNTER — Ambulatory Visit: Admit: 2023-11-27 | Discharge: 2023-11-27 | Payer: MEDICARE

## 2023-11-28 ENCOUNTER — Ambulatory Visit: Admit: 2023-11-28 | Discharge: 2023-11-28 | Payer: MEDICARE

## 2023-11-28 ENCOUNTER — Encounter: Admit: 2023-11-28 | Discharge: 2023-11-28 | Payer: MEDICARE

## 2023-11-28 DIAGNOSIS — C109 Malignant neoplasm of oropharynx, unspecified: Secondary | ICD-10-CM

## 2023-11-28 MED ORDER — OXYCODONE 5 MG PO TAB
5-10 mg | ORAL_TABLET | ORAL | 0 refills | 6.00000 days | Status: AC | PRN
Start: 2023-11-28 — End: ?

## 2023-12-01 ENCOUNTER — Encounter: Admit: 2023-12-01 | Discharge: 2023-12-01 | Payer: MEDICARE

## 2023-12-01 ENCOUNTER — Ambulatory Visit: Admit: 2023-12-01 | Discharge: 2023-12-01 | Payer: MEDICARE

## 2023-12-01 ENCOUNTER — Encounter: Admit: 2023-12-01 | Discharge: 2023-12-02 | Payer: MEDICARE

## 2023-12-01 DIAGNOSIS — C109 Malignant neoplasm of oropharynx, unspecified: Secondary | ICD-10-CM

## 2023-12-01 LAB — CBC AND DIFF
~~LOC~~ BKR ABSOLUTE BASO COUNT: 0 10*3/uL (ref 0.00–0.20)
~~LOC~~ BKR ABSOLUTE EOS COUNT: 0 10*3/uL (ref 0.00–0.45)
~~LOC~~ BKR ABSOLUTE LYMPH COUNT: 0.3 10*3/uL — ABNORMAL LOW (ref 1.00–4.80)
~~LOC~~ BKR ABSOLUTE MONO COUNT: 1.6 10*3/uL — ABNORMAL HIGH (ref 0.00–0.80)
~~LOC~~ BKR ABSOLUTE NEUTROPHIL: 4.3 10*3/uL (ref 1.80–7.00)
~~LOC~~ BKR BASOPHILS %: 0.3 % (ref 0.0–2.0)
~~LOC~~ BKR EOSINOPHILS %: 0.1 % (ref 0.0–5.0)
~~LOC~~ BKR LYMPHOCYTES %: 4.3 % — ABNORMAL LOW (ref 24.0–44.0)
~~LOC~~ BKR MONOCYTES %: 25 % — ABNORMAL HIGH (ref 4.0–12.0)

## 2023-12-01 MED ORDER — MAGNESIUM SULFATE IN WATER 2 GRAM/50 ML (4 %) IV PGBK
2 g | Freq: Once | INTRAVENOUS | 0 refills | Status: CP
Start: 2023-12-01 — End: ?
  Administered 2023-12-01: 16:00:00 2 g via INTRAVENOUS

## 2023-12-01 MED ORDER — PALONOSETRON 0.25 MG/5 ML IV SOLN
.25 mg | Freq: Once | INTRAVENOUS | 0 refills | Status: CP
Start: 2023-12-01 — End: ?
  Administered 2023-12-01: 16:00:00 0.25 mg via INTRAVENOUS

## 2023-12-01 MED ORDER — SODIUM CHLORIDE 0.9% IV SOLP
1000 mL | Freq: Once | INTRAVENOUS | 0 refills | Status: CP
Start: 2023-12-01 — End: ?
  Administered 2023-12-01: 18:00:00 1000 mL via INTRAVENOUS

## 2023-12-01 MED ORDER — POTASSIUM CHLORIDE IN 0.9%NACL 20 MEQ/L IV SOLP
1000 mL | Freq: Once | INTRAVENOUS | 0 refills | Status: CP
Start: 2023-12-01 — End: ?
  Administered 2023-12-01: 16:00:00 1000 mL via INTRAVENOUS

## 2023-12-01 MED ORDER — APREPITANT 130 MG/18 ML (7.2 MG/ML) IV EMUL
130 mg | Freq: Once | INTRAVENOUS | 0 refills | Status: CP
Start: 2023-12-01 — End: ?
  Administered 2023-12-01: 16:00:00 130 mg via INTRAVENOUS

## 2023-12-01 MED ORDER — CISPLATIN IVPB
40 mg/m2 | Freq: Once | INTRAVENOUS | 0 refills | Status: CP
Start: 2023-12-01 — End: ?
  Administered 2023-12-01 (×2): 93.2 mg via INTRAVENOUS

## 2023-12-01 MED ORDER — DEXAMETHASONE 6 MG PO TAB
12 mg | Freq: Once | ORAL | 0 refills | Status: CP
Start: 2023-12-01 — End: ?
  Administered 2023-12-01: 16:00:00 12 mg via ORAL

## 2023-12-01 NOTE — Progress Notes
 Cancer Therapeutics Note  Verified consent signed and in chart.    Blood return positive via: Port (Single and Accessed)    BSA and dose double checked (agree with orders as written) with: yes See MAR for dual RN sign-off    Labs/applicable tests checked: CBC and Comprehensive Metabolic Panel (CMP)    Treatment regimen: Drug/cycle/day: Week 5 Cisplatin    Rate verified and armband double check with second RN: yes    Patient education offered and stated understanding. Denies questions at this time.    Patient here for Cisplatin. Patient reports he is doing well and denies any new issues or concerns other than mouth sores, using magic mouth wash and constipation, educated on use of stool softeners/hydration/increased fiber. PAC accessed in treatment; flushed well with positive blood return noted. Labs reviewed and within normal limits for treatment other than PLT count decreased-ok to treat received from Dr Neita Carp. Premeds and pre-hydration given per MAR. Patient tolerated Cisplatin and post-hydration without issue. PAC de-accessed per protocol. Patient left ambulatory with walker by self in good condition all questions and concerns addressed.

## 2023-12-01 NOTE — Progress Notes
 6.20 12/01/2023 08:42 AM    RBC 3.56 (L) 12/01/2023 08:42 AM    HGB 11.9 (L) 12/01/2023 08:42 AM    HCT 33.6 (L) 12/01/2023 08:42 AM    MCV 94.4 12/01/2023 08:42 AM    MCH 33.4 12/01/2023 08:42 AM    MCHC 35.4 12/01/2023 08:42 AM    RDW 15.3 (H) 12/01/2023 08:42 AM    PLTCT 96 (L) 12/01/2023 08:42 AM    MPV 8.6 12/01/2023 08:42 AM    Lab Results   Component Value Date/Time    NEUT 69.7 12/01/2023 08:42 AM    ANC 4.30 12/01/2023 08:42 AM    LYMA 4.3 (L) 12/01/2023 08:42 AM    ALC 0.30 (L) 12/01/2023 08:42 AM    MONA 25.6 (H) 12/01/2023 08:42 AM    AMC 1.60 (H) 12/01/2023 08:42 AM    EOSA 0.1 12/01/2023 08:42 AM    AEC 0.00 12/01/2023 08:42 AM    BASA 0.3 12/01/2023 08:42 AM    ABC 0.00 12/01/2023 08:42 AM        Comprehensive Metabolic Profile    Lab Results   Component Value Date/Time    NA 128 (L) 12/01/2023 08:42 AM    K 4.2 12/01/2023 08:42 AM    CL 94 (L) 12/01/2023 08:42 AM    GLU 269 (H) 12/01/2023 08:42 AM    BUN 14 12/01/2023 08:42 AM    CR 0.76 12/01/2023 08:42 AM    CA 8.4 (L) 12/01/2023 08:42 AM    TOTPROT 6.2 12/01/2023 08:42 AM    Lab Results   Component Value Date/Time    TOTBILI 1.3 12/01/2023 08:42 AM    ALBUMIN 3.6 12/01/2023 08:42 AM    ALKPHOS 57 12/01/2023 08:42 AM    AST 11 12/01/2023 08:42 AM    CO2 27 12/01/2023 08:42 AM    ALT 13 12/01/2023 08:42 AM    GAP 7 12/01/2023 08:42 AM    EGFR1 >60 02/11/2023 11:43 AM         Some bone in left gum region             Assessment: Tolerated treatment well.     Plan:   -Continue XRT  --Pain meds refilled  __see Dr Georgianne Fick for gum         ATTESTATION    I personally performed the E/M including history, physical exam, and MDM.    Staff name:  Monico Blitz, MD Date: 12/02/2023

## 2023-12-02 ENCOUNTER — Encounter: Admit: 2023-12-02 | Discharge: 2023-12-02 | Payer: MEDICARE

## 2023-12-02 ENCOUNTER — Ambulatory Visit: Admit: 2023-12-02 | Discharge: 2023-12-02 | Payer: MEDICARE

## 2023-12-02 DIAGNOSIS — C109 Malignant neoplasm of oropharynx, unspecified: Secondary | ICD-10-CM

## 2023-12-02 MED FILL — LIDOCAINE HCL 2 % MM SOLN: 2 % | ORAL | 8 days supply | Qty: 300 | Fill #2 | Status: CP

## 2023-12-02 NOTE — Progress Notes
 Clinical Nutrition Assessment Summary    Daryl Baker is a 68 y.o. 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    RD visited with pt in clinic today.  Pt reports is having pain with eating and having a difficult time with more solid textured foods.  Pt reports will drink some protein shake.  RD provided handout and discussed soft high protein foods list.  Pt reports is not doing salt/baking soda rinse, RD encouraged pt to begin doing this throughout the day.  He reports that wife will be helping prepare meals starting this or next week.  RD will remain available.     Pertinent Labs: BG: 211  Pertinent Meds/Supplements: Metformin, Zofran  Pertinent Food Allergies/Intolerance: NKFA        Nutrition Monitoring and Evaluation:  Goal:< 10% wt loss starting weight (245 lbs)  Time Frame:Throughout Treatment and Recovery  -7lbs ( 3%)     The patient was allowed to ask questions and actively participated in creating plan of care. I provided the patient with my contact information and instructed pt on how to get in touch with me if questions or concerns arise. Thank you for allowing nutrition services to participate in this patients care.     Follow up Date: 1 weeks    Hart Robinsons, MS, RD, LD  Phone:[phone]

## 2023-12-03 ENCOUNTER — Encounter: Admit: 2023-12-03 | Discharge: 2023-12-03 | Payer: MEDICARE

## 2023-12-03 ENCOUNTER — Ambulatory Visit: Admit: 2023-12-03 | Discharge: 2023-12-03 | Payer: MEDICARE

## 2023-12-03 DIAGNOSIS — C109 Malignant neoplasm of oropharynx, unspecified: Secondary | ICD-10-CM

## 2023-12-04 ENCOUNTER — Encounter: Admit: 2023-12-04 | Discharge: 2023-12-04 | Payer: MEDICARE

## 2023-12-04 ENCOUNTER — Ambulatory Visit: Admit: 2023-12-04 | Discharge: 2023-12-04 | Payer: MEDICARE

## 2023-12-05 ENCOUNTER — Encounter: Admit: 2023-12-05 | Discharge: 2023-12-05 | Payer: MEDICARE

## 2023-12-05 ENCOUNTER — Ambulatory Visit: Admit: 2023-12-05 | Discharge: 2023-12-05 | Payer: MEDICARE

## 2023-12-05 DIAGNOSIS — R5382 Chronic fatigue, unspecified: Secondary | ICD-10-CM

## 2023-12-05 DIAGNOSIS — C109 Malignant neoplasm of oropharynx, unspecified: Secondary | ICD-10-CM

## 2023-12-05 DIAGNOSIS — E46 Unspecified protein-calorie malnutrition: Secondary | ICD-10-CM

## 2023-12-05 DIAGNOSIS — E1142 Type 2 diabetes mellitus with diabetic polyneuropathy: Secondary | ICD-10-CM

## 2023-12-05 LAB — CBC AND DIFF
~~LOC~~ BKR ABSOLUTE BASO COUNT: 0.1 10*3/uL (ref 0.00–0.20)
~~LOC~~ BKR ABSOLUTE EOS COUNT: 0 10*3/uL (ref 0.00–0.45)
~~LOC~~ BKR ABSOLUTE LYMPH COUNT: 0.3 10*3/uL — ABNORMAL LOW (ref 1.00–4.80)
~~LOC~~ BKR ABSOLUTE MONO COUNT: 1.9 10*3/uL — ABNORMAL HIGH (ref 0.00–0.80)
~~LOC~~ BKR ABSOLUTE NEUTROPHIL: 6.3 10*3/uL (ref 1.80–7.00)
~~LOC~~ BKR BASOPHILS %: 1 % (ref 0.0–2.0)
~~LOC~~ BKR EOSINOPHILS %: 0.1 % (ref 0.0–5.0)
~~LOC~~ BKR HEMATOCRIT: 32 % — ABNORMAL LOW (ref 40.0–50.0)
~~LOC~~ BKR HEMOGLOBIN: 11 g/dL — ABNORMAL LOW (ref 13.5–16.5)
~~LOC~~ BKR LYMPHOCYTES %: 3.7 % — ABNORMAL LOW (ref 24.0–44.0)
~~LOC~~ BKR MONOCYTES %: 22 % — ABNORMAL HIGH (ref 4.0–12.0)
~~LOC~~ BKR MPV: 8.1 fL (ref 7.0–11.0)
~~LOC~~ BKR PLATELET COUNT: 104 10*3/uL — ABNORMAL LOW (ref 150–400)
~~LOC~~ BKR RBC COUNT: 3.4 10*6/uL — ABNORMAL LOW (ref 4.40–5.50)
~~LOC~~ BKR WBC COUNT: 8.6 10*3/uL (ref 4.50–11.00)

## 2023-12-05 LAB — COMPREHENSIVE METABOLIC PANEL
~~LOC~~ BKR AST: 17 U/L (ref 7–40)
~~LOC~~ BKR CALCIUM: 8.7 mg/dL (ref 8.5–10.6)
~~LOC~~ BKR CREATININE: 0.9 mg/dL (ref 0.40–1.24)
~~LOC~~ BKR SODIUM, SERUM: 131 mmol/L — ABNORMAL LOW (ref 137–147)
~~LOC~~ BKR TOTAL BILIRUBIN: 1.1 mg/dL — ABNORMAL HIGH (ref 0.2–1.3)
~~LOC~~ BKR TOTAL PROTEIN: 6.4 g/dL (ref 6.0–8.0)

## 2023-12-05 NOTE — Progress Notes
 Interventional Radiology Outpatient Procedure Review Assessment        Reason for consult: Evaluate for gastrostomy tube in setting of oropharyngeal malignancy.      History of present illness:  Daryl Baker is a 68 y.o. male patient with a history significant for oropharyngeal malignancy with need for feeding access.      Assessment:   - Review of imaging: PET 10/03/23.   - Review of recent labs and allergies: No Known Allergies  - Review of anticoagulation medications: None  - Previous ASA score if available: 3 (If ASA IV, anesthesia will run sedation for procedure if sedation indicated).  - Previous IR Anesthetic/Sedation History (if applicable): None  - Case discussed with referring Physician (Y/N): no  - Contraindications to procedure: none      Plan:  - This case has been reviewed and approved for Gastrostomy tube placement  - Patient position and modality: supine, fluoroscopy  - Intra-procedural Sedation/Medication Plan: Moderate sedation  - Procedure time frame: <10 days, 10-14 days, 14-30 days  - Anticoagulation: Per IR peri procedural anticoagulation management protocol  - Labs: Per IR pre procedural lab protocol  - Additional requests after review: none    Lab/Radiology/Other Diagnostic Tests:  Labs:  Hematology:  Lab Results   Component Value Date    HGB 11.9 (L) 12/01/2023    HCT 33.6 (L) 12/01/2023    PLTCT 96 (L) 12/01/2023    WBC 6.20 12/01/2023    NEUT 69.7 12/01/2023    ANC 4.30 12/01/2023    ALC 0.30 (L) 12/01/2023    AMC 1.60 (H) 12/01/2023    ABC 0.00 12/01/2023    AEC 0.00 12/01/2023    RBC 3.56 (L) 12/01/2023    MCV 94.4 12/01/2023    MCH 33.4 12/01/2023    MCHC 35.4 12/01/2023    MPV 8.6 12/01/2023    RDW 15.3 (H) 12/01/2023   , Coagulation:No results found for: PT, PTT, INR             Cecille Rubin, MD

## 2023-12-05 NOTE — Progress Notes
 Daryl Baker continues to have significant pain with swallowing, constipation and is experiencing miner visual disturbances. Reviewed interventions for constipation, fleet enema and suppository recommended as he cannot tolerate PO option.    Discussed with Dr. Mariane Masters, PEG recommended. Daryl Baker is agreeable with this plan.

## 2023-12-05 NOTE — Progress Notes
 Name: Daryl Baker          MRN: [id number]      DOB: 10/16/1955      AGE: 68 y.o.   DATE OF SERVICE: 12/05/2023      Reason for Visit: Establish Medical Oncology Care with Dr Neita Carp    History of Present Illness    PCP: Geraldo Pitter  Presenting Physician: Dr. Tama Headings   Medical Oncologist:Dr. Neita Carp  Surgeon: Alpha Gula, MD  Radiation Oncologist: Diamantina Providence, MD     Discussion:  Prospective discussion                 Reason for presentation: clarify pathology, discuss tx options                 Clinical History/Significant PMH:  68 y.o male being referred for oropharyngeal mass with lymphadenopathy. Patient was referred to ENT Dr. Jeanice Lim for mass in the back of his throat that has been present for at least 3 months. He reports a left neck mass as well. Per outside report on exam a large ulcerative mass of the left BOT and vallecular with extension down the left aryepiglottic fold and pyriform sinus noted. Patient reports some difficulty swallowing, but denies trouble breathing. He is a former smoker who quit 40 years ago.        Interval History:    Weight loss 50 lbs over the last 6 mos     Odynophagia    (L) tinnitus     Some left-sided lymph nodes that are minimally bothering    Hb A1c 6.7     He is a truck driver.  He smoked almost 40 years ago.  Occasional binging with EtOH.  Accompanied with spouse today.    He is undergoing chemo IMRT, due for C1W6 for cisplatin at 40 mg/m? with decent labs, normal renal function, no transaminitis, slight hyponatremia, hyperglycemia, CBC reveals grade 1 anemia, grade 2 thrombocytopenia and ALC of 300 and ANC of 1600 as of 12/01/2023.    Diabetic Neuropathy stable.         Resulted Micro Last 72 Hrs    No results found            Resulted Micro Last 72 Hrs    No results found           Cancer Staging   Primary cancer of oropharynx (HCC)  Staging form: Pharynx - HPV-Mediated Oropharynx, AJCC 8th Edition  - Clinical stage from 10/14/2023: Stage II (cT3, cN2, cM0, p16+) - Signed by Monico Blitz, MD on 10/14/2023         There is no STAGING for Gliomas, staging is not the same as GRADING.         Review of Systems              amoxicillin-potassium clavulanate (AUGMENTIN) 875/125 mg tablet Take one tablet by mouth twice daily with meals.    dexAMETHasone (DECADRON) 4 mg tablet Beginning 24 hours after each Cisplatin infusion, take 2 tablets by mouth daily for 3 days.    FREESTYLE LIBRE 2 READER reader Use one each as directed every 6 hours.    lidocaine hcl viscous (LIDOCAINE VISCOUS) 2 % solution Swish and Spit 5-10 mL by mouth as directed four times daily as needed.    lidocaine hcl viscous (LIDOCAINE VISCOUS) 2 % solution Rinse or apply with Q tip to sore areas of the oral cavity    lidocaine hcl viscous (LIDOCAINE VISCOUS) 2 %  solution Swish and Spit 5 mL by mouth as directed four times daily as needed.    lidocaine hcl viscous (LIDOCAINE VISCOUS) 2 % solution Swish and Spit 5 mL by mouth as directed four times daily as needed.    losartan (COZAAR) 100 mg tablet Take one tablet by mouth daily.    metFORMIN-XR (GLUCOPHAGE XR) 750 mg extended release tablet Take one tablet by mouth daily with dinner.    metoprolol succinate XL (TOPROL XL) 100 mg extended release tablet Take one tablet by mouth daily.    morphine SR (MS CONTIN) 30 mg ER tablet Take one tablet by mouth every 12 hours for 30 days. Indications: severe chronic pain requiring long-term opioid treatment  Dose adjustment requiring early fill    OLANZapine (ZYPREXA) 5 mg tablet Take 1 tablet by mouth daily at bedtime for 4 days starting on Cisplatin infusion day.  Indications: prevent nausea and vomiting from cancer chemotherapy    ondansetron HCL (ZOFRAN) 8 mg tablet Take one tablet by mouth every 8 hours as needed (nausea and vomiting). Indications: prevent nausea and vomiting from cancer chemotherapy    oxyCODONE (ROXICODONE) 5 mg tablet Take one tablet to two tablets by mouth every 4-6 hours as needed for Pain. Indications: pain    triamcinolone acetonide (TRIDERM) 0.1 % topical cream Apply one g topically to affected area twice daily.    triamcinolone acetonide (TRIDERM) 0.1 % topical cream Apply one g topically to affected area twice daily.    triamcinolone acetonide (TRIDERM) 0.1 % topical cream Apply one g topically to affected area twice daily.    TRULICITY 0.75 mg/0.5 mL injection pen Inject 0.5 mL under the skin every 7 days.     Vitals:    12/05/23 1337   BP: 131/68   BP Source: Arm, Left Upper   Pulse: 97   Temp: 36.2 ?C (97.2 ?F)   Resp: 16   TempSrc: Temporal   PainSc: Seven   Weight: 104.8 kg (231 lb)   Height: 180.3 cm (5' 10.98)       Body mass index is 32.23 kg/m?Marland Kitchen        Physical Exam      Domains    Gait 1 - Abnormal but walks without assistance    Strength 0- Normal   Ataxia (upper extremities) 0- Normal   Sensation 0- Normal   Visual fields 0- Normal   Facial strength 0- Normal   Language 0- Normal   Level of consciousness 0- Normal   Behavior 0- Normal     TOTAL NANO SCORE: 1    This is an age-appropriate obese 68 year old Caucasian male with a KPS of % 90.  VSS are stable and as above. AAO x 3.HEENT: Cranial nerves are intact. PERRLA. EOMI. TML. Transoral exam within normal limits. Neck: Supple, with normal ROM.  BOT tumor not visible large left level 2 l lymphadenopathy with ill-defined borders noted lymphadenopathy.  THORAX: Lungs clear to auscultation bilaterally. PSYCH: Pleasant, cooperative.         CBC w diff    Lab Results   Component Value Date/Time    WBC 8.60 12/05/2023 01:25 PM    RBC 3.44 (L) 12/05/2023 01:25 PM    HGB 11.6 (L) 12/05/2023 01:25 PM    HCT 32.3 (L) 12/05/2023 01:25 PM    MCV 94.0 12/05/2023 01:25 PM    MCH 33.7 12/05/2023 01:25 PM    MCHC 35.8 12/05/2023 01:25 PM    RDW 15.8 (H) 12/05/2023 01:25  PM    PLTCT 104 (L) 12/05/2023 01:25 PM    MPV 8.1 12/05/2023 01:25 PM    Lab Results   Component Value Date/Time    NEUT 73.1 12/05/2023 01:25 PM    ANC 6.30 12/05/2023 01:25 PM    LYMA 3.7 (L) 12/05/2023 01:25 PM    ALC 0.30 (L) 12/05/2023 01:25 PM    MONA 22.1 (H) 12/05/2023 01:25 PM    AMC 1.90 (H) 12/05/2023 01:25 PM    EOSA 0.1 12/05/2023 01:25 PM    AEC 0.00 12/05/2023 01:25 PM    BASA 1.0 12/05/2023 01:25 PM    ABC 0.10 12/05/2023 01:25 PM        Comprehensive Metabolic Profile    Lab Results   Component Value Date/Time    NA 128 (L) 12/01/2023 08:42 AM    K 4.2 12/01/2023 08:42 AM    CL 94 (L) 12/01/2023 08:42 AM    CO2 27 12/01/2023 08:42 AM    GAP 7 12/01/2023 08:42 AM    BUN 14 12/01/2023 08:42 AM    CR 0.76 12/01/2023 08:42 AM    GLU 269 (H) 12/01/2023 08:42 AM    Lab Results   Component Value Date/Time    CA 8.4 (L) 12/01/2023 08:42 AM    ALBUMIN 3.6 12/01/2023 08:42 AM    TOTPROT 6.2 12/01/2023 08:42 AM    ALKPHOS 57 12/01/2023 08:42 AM    AST 11 12/01/2023 08:42 AM    ALT 13 12/01/2023 08:42 AM    TOTBILI 1.3 12/01/2023 08:42 AM    GFR >60 12/01/2023 08:42 AM         TSH   Lab Results   Component Value Date/Time    TSH 1.17 10/03/2023 10:57 AM         Pathological Data ( including NGS, if applicable)    No results found for requested labs within last 365 days.         Radiological Data:     January 2025                                                        January 2025         IMPRESSION       Large ulcerative left oropharyngeal mass with involvement of base of   tongue musculature.     Enlarged hypermetabolic right level 2 and left level 2 and 3 cervical   nodal metastases.     Non-FDG avid subpleural right lower lobe nodule, likely from benign   radiology.         By my electronic signature, I attest that I have personally reviewed the   images for this examination and formulated the interpretations and   opinions expressed in this report        Finalized by Darrick Meigs, M.D. on 10/03/2023 1:17 PM. Dictated by Darden Dates, MD on 10/03/2023 12:55 PM.              Pain Addressed:  Patient declines intervention    Patient Evaluated for a Clinical Trial: Patient currently in screening for a treatment clinical trial.     Eastern Cooperative Oncology Group performance status is 1, Restricted in physically strenuous activity but ambulatory and able to carry out work of a light or sedentary nature,  e.g., light house work, office work.         Impression/Recommendation:      This a very nice 68 year old gentleman who appears to have a large left BOT tumor but multilevel most prominent level 2 bilateral nodal involvement left greater than right.  The tumor does not appear to be crossing the midline.  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    He will be getting interval PET scan next week and see Dr. Mariane Masters next Friday.    We have briefly talked about de-intensification trials for p16+ oropharyngeal cancers,utilizing less radiation of 60 Gy in 6 weeks versus 60 Gy in 5 weeks with or without chemotherapy.  However, all of these de-intensification trials are currently under investigation, therefore not recommendable as standard treatment at this time. Typical standard XRT approach is 70 Gy of IMRT in 35 fractions with 56 Gy to the at risk lymph nodes which is SOC at this time.  Therefore, concurrent with IMRT, we are planning to proceed with standard treatment 70 Gy in 35 fractions along withweekly cisplatinum at 40 mg/m?    Depending on the level of response, he would be eligible for EVOLVE trial.        Toxicity profile of cis-platinum along with AE's have been discussed in detail with the patient.      -Dental Evaluation have been completed  -PET scan as above, reveals large ulcerative left oropharyngeal mass with a large hypermetabolic right level 2 and left level 2 and 3 cervical node metastasis.  Questionable non-FDG avid subpleural right lower lobe nodule.      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 Mariane Masters, status post 5 cycles of weekly cisplatin, due for C6 on 12/08/2023  -Continue to monitor for toxicity, continue to monitor labs, no evidence of cytopenias or organ dysfunction  -RTC in 4 weeks with labs for postradiation toxicity check        The case has been extensively discussed with the patient and the family in the room who verbalized understanding, questions were answered and a total of 30 minutes was spent discussing about the diagnosis,prognosis, treatment options and toxicity profile as well as long-term outcomes.     Gita Kudo, MD   Neuro-Oncology/Medical Oncology

## 2023-12-08 ENCOUNTER — Encounter: Admit: 2023-12-08 | Discharge: 2023-12-08 | Payer: MEDICARE

## 2023-12-08 ENCOUNTER — Ambulatory Visit: Admit: 2023-12-08 | Discharge: 2023-12-08 | Payer: MEDICARE

## 2023-12-08 ENCOUNTER — Emergency Department
Admit: 2023-12-08 | Discharge: 2023-12-08 | Payer: MEDICARE | Attending: Student in an Organized Health Care Education/Training Program

## 2023-12-08 DIAGNOSIS — I10 Essential (primary) hypertension: Secondary | ICD-10-CM

## 2023-12-08 DIAGNOSIS — J392 Other diseases of pharynx: Secondary | ICD-10-CM

## 2023-12-08 DIAGNOSIS — E1165 Type 2 diabetes mellitus with hyperglycemia: Secondary | ICD-10-CM

## 2023-12-08 DIAGNOSIS — K1379 Other lesions of oral mucosa: Secondary | ICD-10-CM

## 2023-12-08 DIAGNOSIS — E785 Hyperlipidemia, unspecified: Secondary | ICD-10-CM

## 2023-12-08 DIAGNOSIS — C109 Malignant neoplasm of oropharynx, unspecified: Secondary | ICD-10-CM

## 2023-12-08 LAB — KC ED MAIN ECG TRIAGE ONLY
P AXIS: 61 degrees
P-R INTERVAL: 184 ms
Q-T INTERVAL: 352 ms
QRS DURATION: 136 ms
QTC CALCULATION (BAZETT): 465 ms
R AXIS: 28 degrees
T AXIS: 13 degrees
VENTRICULAR RATE: 105 {beats}/min

## 2023-12-08 LAB — CBC
~~LOC~~ BKR HEMATOCRIT: 25 % — ABNORMAL LOW (ref 40.0–50.0)
~~LOC~~ BKR HEMOGLOBIN: 9.2 g/dL — ABNORMAL LOW (ref 13.5–16.5)
~~LOC~~ BKR MCH: 33 pg (ref 26.0–34.0)
~~LOC~~ BKR MCV: 93 fL — ABNORMAL LOW (ref 80.0–100.0)
~~LOC~~ BKR MPV: 7.8 fL (ref 7.0–11.0)
~~LOC~~ BKR PLATELET COUNT: 95 10*3/uL — ABNORMAL LOW (ref 150–400)
~~LOC~~ BKR RBC COUNT: 2.7 10*6/uL — ABNORMAL LOW (ref 4.40–5.50)
~~LOC~~ BKR WBC COUNT: 2.3 10*3/uL — ABNORMAL LOW (ref 4.50–11.00)

## 2023-12-08 LAB — COMPREHENSIVE METABOLIC PANEL
~~LOC~~ BKR CALCIUM: 8.1 mg/dL — ABNORMAL LOW (ref 8.5–10.6)
~~LOC~~ BKR TOTAL BILIRUBIN: 1.2 mg/dL (ref 0.2–1.3)
~~LOC~~ BKR TOTAL PROTEIN: 5.6 g/dL — ABNORMAL LOW (ref 6.0–8.0)

## 2023-12-08 LAB — POC CREATININE: ~~LOC~~ BKR POC CREATININE: 0.7 mg/dL (ref 0.4–1.24)

## 2023-12-08 LAB — TSH WITH FREE T4 REFLEX: ~~LOC~~ BKR TSH: 0.3 [IU]/mL — ABNORMAL HIGH (ref 0.35–5.00)

## 2023-12-08 LAB — MAGNESIUM: ~~LOC~~ BKR MAGNESIUM: 1.7 mg/dL — ABNORMAL LOW (ref 1.6–2.6)

## 2023-12-08 MED ORDER — IOHEXOL 350 MG IODINE/ML IV SOLN
80 mL | Freq: Once | INTRAVENOUS | 0 refills | Status: CP
Start: 2023-12-08 — End: ?
  Administered 2023-12-08: 16:00:00 80 mL via INTRAVENOUS

## 2023-12-08 MED ORDER — ONDANSETRON HCL (PF) 4 MG/2 ML IJ SOLN
4 mg | INTRAVENOUS | 0 refills | Status: DC | PRN
Start: 2023-12-08 — End: 2024-01-02
  Administered 2023-12-09 – 2023-12-11 (×3): 4 mg via INTRAVENOUS

## 2023-12-08 MED ORDER — MELATONIN 5 MG PO TAB
5 mg | Freq: Every evening | ORAL | 0 refills | Status: DC | PRN
Start: 2023-12-08 — End: 2023-12-17
  Administered 2023-12-14: 02:00:00 5 mg via ORAL

## 2023-12-08 MED ORDER — LACTATED RINGERS IV SOLP
1000 mL | Freq: Once | INTRAVENOUS | 0 refills | Status: CP
Start: 2023-12-08 — End: ?
  Administered 2023-12-08: 21:00:00 1000 mL via INTRAVENOUS

## 2023-12-08 MED ORDER — SENNOSIDES-DOCUSATE SODIUM 8.6-50 MG PO TAB
1 | Freq: Every day | ORAL | 0 refills | Status: DC | PRN
Start: 2023-12-08 — End: 2023-12-08

## 2023-12-08 MED ORDER — MORPHINE 4 MG/ML IV SYRG
4 mg | Freq: Once | INTRAVENOUS | 0 refills | Status: CP
Start: 2023-12-08 — End: ?
  Administered 2023-12-08: 17:00:00 4 mg via INTRAVENOUS

## 2023-12-08 MED ORDER — MAGNESIUM SULFATE IN D5W 1 GRAM/100 ML IV PGBK
1 g | INTRAVENOUS | 0 refills | Status: CP
Start: 2023-12-08 — End: ?
  Administered 2023-12-09: 01:00:00 1 g via INTRAVENOUS

## 2023-12-08 MED ORDER — ONDANSETRON HCL (PF) 4 MG/2 ML IJ SOLN
4 mg | Freq: Once | INTRAVENOUS | 0 refills | Status: CP
Start: 2023-12-08 — End: ?
  Administered 2023-12-08: 17:00:00 4 mg via INTRAVENOUS

## 2023-12-08 MED ORDER — SODIUM CHLORIDE 0.65 % NA SPRA
2 | NASAL | 0 refills | Status: DC | PRN
Start: 2023-12-08 — End: 2024-01-01
  Administered 2023-12-09: 03:00:00 2 via NASAL

## 2023-12-08 MED ORDER — ONDANSETRON 4 MG PO TBDI
4 mg | ORAL | 0 refills | Status: DC | PRN
Start: 2023-12-08 — End: 2024-01-02

## 2023-12-08 MED ORDER — POLYETHYLENE GLYCOL 3350 17 GRAM PO PWPK
1 | Freq: Every day | ORAL | 0 refills | Status: DC | PRN
Start: 2023-12-08 — End: 2023-12-08

## 2023-12-08 MED ORDER — METOPROLOL SUCCINATE 50 MG PO TB24
100 mg | Freq: Every day | ORAL | 0 refills | Status: DC
Start: 2023-12-08 — End: 2023-12-18

## 2023-12-08 MED ORDER — DEXTROSE 50 % IN WATER (D50W) IV SYRG
12.5-25 g | INTRAVENOUS | 0 refills | Status: DC | PRN
Start: 2023-12-08 — End: 2024-01-02
  Administered 2023-12-24: 11:00:00 25 mL via INTRAVENOUS

## 2023-12-08 MED ORDER — LIDOCAINE HCL 2 % MM SOLN
5 mL | Freq: Four times a day (QID) | ORAL | 0 refills | Status: DC | PRN
Start: 2023-12-08 — End: 2023-12-09

## 2023-12-08 MED ORDER — MORPHINE 15 MG PO TBER
30 mg | Freq: Two times a day (BID) | ORAL | 0 refills | Status: DC
Start: 2023-12-08 — End: 2023-12-09
  Administered 2023-12-09: 01:00:00 30 mg via ORAL

## 2023-12-08 MED ORDER — INSULIN ASPART 100 UNIT/ML SC FLEXPEN
0-6 [IU] | Freq: Before meals | SUBCUTANEOUS | 0 refills | Status: DC
Start: 2023-12-08 — End: 2023-12-15

## 2023-12-08 MED ORDER — ENOXAPARIN 40 MG/0.4 ML SC SYRG
40 mg | Freq: Every day | SUBCUTANEOUS | 0 refills | Status: DC
Start: 2023-12-08 — End: 2023-12-22
  Administered 2023-12-09 – 2023-12-22 (×13): 40 mg via SUBCUTANEOUS

## 2023-12-08 MED ORDER — SENNOSIDES-DOCUSATE SODIUM 8.6-50 MG PO TAB
1 | Freq: Two times a day (BID) | ORAL | 0 refills | Status: DC
Start: 2023-12-08 — End: 2023-12-09

## 2023-12-08 MED ORDER — FENTANYL CITRATE (PF) 50 MCG/ML IJ SOLN
50 ug | Freq: Once | INTRAVENOUS | 0 refills | Status: CP
Start: 2023-12-08 — End: ?
  Administered 2023-12-08: 15:00:00 50 ug via INTRAVENOUS

## 2023-12-08 MED ORDER — LIDOCAINE HCL 2 % MM SOLN
Freq: Once | ORAL | 0 refills | Status: AC
Start: 2023-12-08 — End: ?

## 2023-12-08 MED ORDER — OXYCODONE 5 MG PO TAB
5-10 mg | ORAL | 0 refills | Status: DC | PRN
Start: 2023-12-08 — End: 2023-12-09

## 2023-12-08 MED ORDER — LACTATED RINGERS IV SOLP
1000 mL | Freq: Once | INTRAVENOUS | 0 refills | Status: CP
Start: 2023-12-08 — End: ?
  Administered 2023-12-08: 19:00:00 1000 mL via INTRAVENOUS

## 2023-12-08 MED ORDER — SODIUM CHLORIDE 0.9 % IJ SOLN
50 mL | Freq: Once | INTRAVENOUS | 0 refills | Status: CP
Start: 2023-12-08 — End: ?
  Administered 2023-12-08: 16:00:00 50 mL via INTRAVENOUS

## 2023-12-08 MED ORDER — LOSARTAN 50 MG PO TAB
100 mg | Freq: Every day | ORAL | 0 refills | Status: DC
Start: 2023-12-08 — End: 2023-12-11

## 2023-12-08 MED ORDER — POLYETHYLENE GLYCOL 3350 17 GRAM PO PWPK
1 | Freq: Two times a day (BID) | ORAL | 0 refills | Status: DC
Start: 2023-12-08 — End: 2023-12-09

## 2023-12-08 MED ORDER — MAGNESIUM SULFATE IN D5W 1 GRAM/100 ML IV PGBK
1 g | INTRAVENOUS | 0 refills | Status: CP
Start: 2023-12-08 — End: ?
  Administered 2023-12-08: 21:00:00 1 g via INTRAVENOUS

## 2023-12-08 NOTE — Progress Notes
 Daryl Baker presents for nurse visit after treatment.   He reports severe pain that has left him unable to eat, and minimally drink fluids, the past 3-4 days.   Vital signs obtained, with tachycardia and low blood pressure. He will see Medical Oncology at 8:30 am today, encouraged discussion of symptoms and IV hydration.   G-tube placement orders - will f/u on scheduling early this week.   X1 BM over the weekend, hard. Reiterated enema/suppository options.   Dr. Dian Situ team notified.    Wt Readings from Last 3 Encounters:   12/08/23 102 kg (224 lb 13.9 oz)   12/05/23 104.8 kg (231 lb)   12/05/23 105.7 kg (233 lb)      Vitals:    12/08/23 0745   BP: 112/53   BP Source: Arm, Left Lower   Pulse: (!) 125   Temp: 36.7 ?C (98.1 ?F)   Resp: 20   SpO2: 98%   TempSrc: Temporal   PainSc: Ten   Weight: 102 kg (224 lb 13.9 oz)

## 2023-12-08 NOTE — ED Notes
 Patient presents to the ED with throat/mouth pain, he has an open wound on the left side of his tongue. Pt was transported from radiation treatment due to his pain and low BP. When he arrived his blood pressure is WDL, it is unknown what his BP was at radiation. He has a history of throat cancer and has completed 26 radiation treatments. Pt rates his pain an 8/10. He has a power port in place. He denies fever and chills. Pt is maintaining his own airway, breathing is WDL.

## 2023-12-08 NOTE — Progress Notes
 RT Adult Assessment Note    NAME:Daryl Baker             MRN: [id number]             DOB:12-15-1955          AGE: 68 y.o.  ADMISSION DATE: 12/08/2023             DAYS ADMITTED: LOS: 0 days    Additional Comments:  Impressions of the patient: Patient resting in bed, in no distress. Denies any pulmonary history and also denies any OSA history.   Intervention(s)/outcome(s): See RT Orders  Patient education that was completed: None at this time  Recommendations to the care team: At this time the RT Protocol will be discontinued due to only qualifying for IS with nursing. Continue to monitor patients respiratory status    Vital Signs:  Pulse:    RR:    SpO2:    O2 Device: None (Room air)  Liter Flow:    O2%:      Breath Sounds:   Breath Sounds WDL: Within Defined Limits  Respiratory Effort:   Respiratory WDL: Within Defined Limits  Respiratory Effort/Pattern: Unlabored  Comments:

## 2023-12-08 NOTE — H&P (View-Only)
 ADMISSION HISTORY AND PHYSICAL      Name:  Daryl Baker                                             MRN:  [id number]   Admission Date:  12/08/2023    ASSESSMENT & PLAN     68 y.o.   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 intake admitted for further management.    Dysphagia   Oropharyngeal pain  -Due to left oropharyngeal cancer  -Patient endorses inability to take significant p.o. intake  -Feels weak and fatigued  Plan  -keep patient NPO AM  -Consult IR   -Will order viscous lidocaine.  Resume PTA morphine and Oxycodone     Left HPV-Mediated Oropharynx cancer  -Currently on chemo and radiation  -Follow in Oncology clinic with dr Neita Carp and radiation Oncology Dr Mariane Masters  -CT Neck revealed  Marked decrease size of the prior large left oropharyngeal mass. There is a residual 1.7 x 1.5 cm lesion involving the left tonsil with central fluid attenuation.  -Follow up with oncology and radiation oncology as outpatient     Pancytopenia   -likely due to Chemo  -follow with daily CBC    Hypomagnesemia  -will replete with 2gram magnesium sulfate  -Recheck mag in am    Constipation  -Scheduled MiraLAX and Senokot    Hypertension  -resume PTA Metoprolol and Losartan     DM type II  -most recent hgba1c from 10/03/23 was 6.8  -currently on Metformin and Trulicity   -hold above medications  -start low dose correction factor        FEN: no IVF, electrolytes reviewed, No diet orders on file diet  Prophylaxis:    Disposition: Admit to medicine.    Code status: Full code (Discussed on 12/08/2023)      ** I have discussed the management and  plan of care  with patient/family as listed above.     After 8 am today,  please page rounding physician or appropriate  team pager in regard of this patient.      Total Time Today was 75 minutes in the following activities: Preparing to see the patient, Obtaining and/or reviewing separately obtained history, Performing a medically appropriate examination and/or evaluation, Counseling and educating the patient/family/caregiver, Ordering medications, tests, or procedures, Referring and communication with other health care professionals (when not separately reported), Documenting clinical information in the electronic or other health record, and Independently interpreting results (not separately reported) and communicating results to the patient/family/caregiver     Norm Parcel, MD       12/08/2023     Internal Medicine, Nocturnist    8 PM - 8 AM  Pager:  [phone]             _______________________________________________________________________________________________________________________________________________    Chief Complaint:  Unable to take po intake    History of Present Illness     Daryl Baker is a 68 y.o.  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 intake admitted for further  management.  Patient endorses decreased p.o. intake secondary to left oropharyngeal mass.  He has had fatigue and associated weakness.  Patient is in the process of getting scheduled for possible G-tube placement.  After completion of radiation patient was transferred to the emergency department due to worsening oral pain and also possible G-tube placement.    Review of Systems   Review of Systems   Constitutional:  Positive for malaise/fatigue and weight loss.   HENT:          Difficulty swelling   Oropharyngeal pain   All other systems reviewed and are negative.    Past medical history    has a past medical history of DM (diabetes mellitus) (CMS-HCC), Hyperlipidemia, Hypertension, Neurogenic amyotrophy, and Neuropathy. Past surgical history    has a past surgical history that includes Abdominal hernia repair and knee arthroscopy (Left).       Family history     Family History   Problem Relation Name Age of Onset   ? Cancer Mother     ? Diabetes Mother     ? Hypertension Mother     ? Cancer-Lung Mother     ? Stroke Father     ? Huntington's Chorea Maternal Grandfather           Social history     Social History     Socioeconomic History   ? Marital status: Married   Tobacco Use   ? Smoking status: Former     Current packs/day: 1.00     Types: Cigarettes     Passive exposure: Past   ? Smokeless tobacco: Never   Vaping Use   ? Vaping status: Never Used   Substance and Sexual Activity   ? Alcohol use: Yes     Alcohol/week: 1.0 standard drink of alcohol     Types: 1 Shots of liquor per week   ? Drug use: Never   ? Sexual activity: Not Currently     Partners: Female            Allergies   Patient has no known allergies.    Medications     Current Facility-Administered Medications   Medication   ? lidocaine hcl viscous 2 % solution     Current Outpatient Medications   Medication Sig   ? amoxicillin-potassium clavulanate (AUGMENTIN) 875/125 mg tablet Take one tablet by mouth twice daily with meals.   ? dexAMETHasone (DECADRON) 4 mg tablet Beginning 24 hours after each Cisplatin infusion, take 2 tablets by mouth daily for 3 days.   ? FREESTYLE LIBRE 2 READER reader Use one each as directed every 6 hours.   ? lidocaine hcl viscous (LIDOCAINE VISCOUS) 2 % solution Swish and Spit 5-10 mL by mouth as directed four times daily as needed.   ? lidocaine hcl viscous (LIDOCAINE VISCOUS) 2 % solution Rinse or apply with Q tip to sore areas of the oral cavity   ? lidocaine hcl viscous (LIDOCAINE VISCOUS) 2 % solution Swish and Spit 5 mL by mouth as directed four times daily as needed.   ? lidocaine hcl viscous (LIDOCAINE VISCOUS) 2 % solution Swish and Spit 5 mL by mouth as directed four times daily as needed.   ? losartan (COZAAR) 100 mg tablet Take one tablet by mouth daily.   ? metFORMIN-XR (GLUCOPHAGE XR) 750 mg extended release tablet Take one tablet by mouth daily with dinner.   ? metoprolol succinate XL (TOPROL XL) 100 mg extended release tablet Take one tablet  by mouth daily.   ? morphine SR (MS CONTIN) 30 mg ER tablet Take one tablet by mouth every 12 hours for 30 days. Indications: severe chronic pain requiring long-term opioid treatment  Dose adjustment requiring early fill   ? OLANZapine (ZYPREXA) 5 mg tablet Take 1 tablet by mouth daily at bedtime for 4 days starting on Cisplatin infusion day.  Indications: prevent nausea and vomiting from cancer chemotherapy   ? ondansetron HCL (ZOFRAN) 8 mg tablet Take one tablet by mouth every 8 hours as needed (nausea and vomiting). Indications: prevent nausea and vomiting from cancer chemotherapy   ? oxyCODONE (ROXICODONE) 5 mg tablet Take one tablet to two tablets by mouth every 4-6 hours as needed for Pain. Indications: pain   ? triamcinolone acetonide (TRIDERM) 0.1 % topical cream Apply one g topically to affected area twice daily.   ? triamcinolone acetonide (TRIDERM) 0.1 % topical cream Apply one g topically to affected area twice daily.   ? triamcinolone acetonide (TRIDERM) 0.1 % topical cream Apply one g topically to affected area twice daily.   ? TRULICITY 0.75 mg/0.5 mL injection pen Inject 0.5 mL under the skin every 7 days.          Physical exam     BP: 126/71 (03/10 1300)  Temp: 36.8 ?C (98.3 ?F) (03/10 1478)  Pulse: 94 (03/10 1300)  Respirations: 15 PER MINUTE (03/10 1300)  SpO2: 94 % (03/10 1300)  O2 Device: None (Room air) (03/10 0907) BP: (112-127)/(53-83)   Temp:  [36.7 ?C (98.1 ?F)-36.8 ?C (98.3 ?F)]   Pulse:  [94-125]   Respirations:  [14 PER MINUTE-16 PER MINUTE]   SpO2:  [94 %-100 %]   O2 Device: None (Room air)  Vitals:    12/08/23 0907   Weight: 102 kg (224 lb 13.9 oz)          General: NAD, AAOx3,   Eyes: anicteric sclerae, EOMI. PERRL, pink conjunctiva  Ears, nose, mouth, and throat : MMM.   Left tongue oral lesions noted   Neck:  Symmetric appearance without crepitus, no obvious mass or noticeable swelling.   Lungs:  No use of accessory muscles.  Clear to auscultation bilaterally without wheezes, rales, or rhonchi.   Cardiovascular: RRR, S1 &S2 normal, no murmurs, clicks, rubs, or gallops appreciated.  No edema in BLE.   Abdomen:  BS+, soft, no guarding or rigidity.  Nontender to palpation without palpable masses.  No rebound tenderness.   GU: No suprapubic or CVA tenderness.  No foley  Skin: Skin color normal, no obvious evidence of rashes.   Musculoskeletal: moves all ext.    Neurologic:  Alert and oriented . CN II-XII grossly intact. No focal weakness  Psych:  Calm, normal affect    Labs        Recent Labs     12/05/23  1325 12/08/23  0959 12/08/23  1006   NA 131* 134*  --    K 4.2 4.0  --    CL 98 97*  --    CO2 27 23  --    BUN 26* 11  --    CR 0.90 0.64 0.7   GAP 6 14*  --    MG  --  1.7  --    GFR >60 >60  --    ALBUMIN 3.8 3.4*  --    TOTBILI 1.1 1.2  --    AST 17 11  --    ALT 19 13  --  Recent Labs     12/05/23  1325 12/08/23  1146   WBC 8.60 2.30*   HGB 11.6* 9.2*   HCT 32.3* 25.9*   PLTCT 104* 95*   MCV 94.0 93.1   INR 1.0  --           Radiology and Other Diagnostic Procedures Review     CT NECK W/CONTRAST  Result Date: 12/08/2023  1.  Marked decrease size of the prior large left oropharyngeal mass. There is a residual 1.7 x 1.5 cm lesion involving the left tonsil with central fluid attenuation. Treated necrotic tumor is the leading consideration. A post therapeutic retention cyst could appear similar. An abscess is thought less likely though direct visualization and clinical correlation is recommended regarding symptoms of infection. 2.  Improvement of bilateral cervical lymphadenopathy. By my electronic signature, I attest that I have personally reviewed the images for this examination and formulated the interpretations and opinions expressed in this report  Finalized by Marily Memos, M.D. on 12/08/2023 12:00 PM. Dictated by Jerene Pitch, MD on 12/08/2023 11:07 AM.    CT HEAD WO CONTRAST  Result Date: 12/08/2023  1.  No acute intracranial hemorrhage or mass effect. 2.  Mild patchy cerebral white matter hypodensities, likely sequelae of chronic microvascular ischemia. 3.  The CT neck is dictated separately. Please see that report. By my electronic signature, I attest that I have personally reviewed the images for this examination and formulated the interpretations and opinions expressed in this report  Finalized by Marily Memos, M.D. on 12/08/2023 12:00 PM. Dictated by Jerene Pitch, MD on 12/08/2023 10:51 AM.

## 2023-12-08 NOTE — Progress Notes
 Dental Note    Patient was scheduled in the dental clinic this afternoon for follow-up appointment for bone spur.  He was assessed in the ED instead.     Patient states his area of pain is associated with the left retromolar pad / oropharynx. On exam there is erythematous tissue present likely contiguous with his tumor. He also reports some tongue soreness and soreness of tooth #20. Dry mouth and mucositis present.     Applied topical benzocaine to lingual #19 area and smoothed a small area of bone to patient's satisfaction. He reports significant improvement.     Dental will follow up with patient in the clinic for next visit.   I recommend Magic Mouthwash or Viscous lidocaine rinse for symptomatic relief    Avant Printy, DDS  [phone]

## 2023-12-09 ENCOUNTER — Encounter: Admit: 2023-12-09 | Discharge: 2023-12-09 | Payer: MEDICARE

## 2023-12-09 ENCOUNTER — Ambulatory Visit: Admit: 2023-12-09 | Discharge: 2023-12-09 | Payer: MEDICARE

## 2023-12-09 LAB — CBC AND DIFF
~~LOC~~ BKR ABSOLUTE BASO COUNT: 0 10*3/uL — ABNORMAL HIGH (ref 0.00–0.20)
~~LOC~~ BKR ABSOLUTE EOS COUNT: 0 10*3/uL — ABNORMAL LOW (ref 0.00–0.45)
~~LOC~~ BKR ABSOLUTE LYMPH COUNT: 0.2 10*3/uL — ABNORMAL LOW (ref 1.00–4.80)
~~LOC~~ BKR ABSOLUTE MONO COUNT: 0.4 10*3/uL — ABNORMAL HIGH (ref 0.00–0.80)
~~LOC~~ BKR ABSOLUTE NEUTROPHIL: 1.5 10*3/uL — ABNORMAL LOW (ref 1.80–7.00)
~~LOC~~ BKR BASOPHILS %: 0.2 % — ABNORMAL HIGH (ref 0.0–2.0)
~~LOC~~ BKR EOSINOPHILS %: 0.1 % (ref 0.0–5.0)
~~LOC~~ BKR HEMATOCRIT: 26 % — ABNORMAL LOW (ref 40.0–50.0)
~~LOC~~ BKR MCHC: 34 g/dL — ABNORMAL HIGH (ref 32.0–36.0)
~~LOC~~ BKR MDW (MONOCYTE DISTRIBUTION WIDTH): 20 % — ABNORMAL LOW (ref 36.0–<=20.6)
~~LOC~~ BKR MONOCYTES %: 19 % — ABNORMAL HIGH (ref 4.0–12.0)
~~LOC~~ BKR MPV: 7.7 fL — ABNORMAL HIGH (ref 7.0–11.0)
~~LOC~~ BKR PLATELET COUNT: 101 10*3/uL — ABNORMAL LOW (ref 150–400)
~~LOC~~ BKR RBC COUNT: 2.7 10*6/uL — ABNORMAL LOW (ref 4.40–5.50)
~~LOC~~ BKR RDW: 15 % — ABNORMAL HIGH (ref 11.0–15.0)
~~LOC~~ BKR WBC COUNT: 2.1 10*3/uL — ABNORMAL LOW (ref 4.50–11.00)

## 2023-12-09 LAB — COMPREHENSIVE METABOLIC PANEL
~~LOC~~ BKR ALBUMIN: 3.2 g/dL — ABNORMAL LOW (ref 3.5–5.0)
~~LOC~~ BKR ALK PHOSPHATASE: 52 U/L — ABNORMAL LOW (ref 25–110)
~~LOC~~ BKR BLD UREA NITROGEN: 7 mg/dL — ABNORMAL LOW (ref 7–25)
~~LOC~~ BKR POTASSIUM: 3.8 mmol/L — ABNORMAL LOW (ref 3.5–5.1)

## 2023-12-09 LAB — POC GLUCOSE
~~LOC~~ BKR POC GLUCOSE: 115 mg/dL — ABNORMAL HIGH (ref 70–100)
~~LOC~~ BKR POC GLUCOSE: 115 mg/dL — ABNORMAL HIGH (ref 70–100)
~~LOC~~ BKR POC GLUCOSE: 106 mg/dL — ABNORMAL HIGH (ref 70–100)
~~LOC~~ BKR POC GLUCOSE: 114 mg/dL — ABNORMAL HIGH (ref 70–100)
~~LOC~~ BKR POC GLUCOSE: 85 mg/dL (ref 70–100)

## 2023-12-09 LAB — URINALYSIS DIPSTICK REFLEX TO CULTURE
~~LOC~~ BKR LEUKOCYTES: NEGATIVE
~~LOC~~ BKR NITRITE: NEGATIVE
~~LOC~~ BKR URINE BILE: NEGATIVE
~~LOC~~ BKR URINE BLOOD: NEGATIVE
~~LOC~~ BKR URINE PH: 6 /HPF (ref 5.0–8.0)

## 2023-12-09 LAB — PHOSPHORUS: ~~LOC~~ BKR PHOSPHORUS: 2.9 mg/dL (ref 2.0–4.5)

## 2023-12-09 LAB — MAGNESIUM: ~~LOC~~ BKR MAGNESIUM: 1.8 mg/dL — ABNORMAL LOW (ref 1.6–2.6)

## 2023-12-09 MED ORDER — LACTATED RINGERS IV SOLP
1500 mL | Freq: Once | INTRAVENOUS | 0 refills | Status: CP
Start: 2023-12-09 — End: ?
  Administered 2023-12-09: 20:00:00 500 mL via INTRAVENOUS

## 2023-12-09 MED ORDER — PATCH DOCUMENTATION - FENTANYL 25 MCG/HR
Freq: Two times a day (BID) | TRANSDERMAL | 0 refills | Status: CN
Start: 2023-12-09 — End: ?

## 2023-12-09 MED ORDER — PATCH DOCUMENTATION - FENTANYL 25 MCG/HR
Freq: Two times a day (BID) | TRANSDERMAL | 0 refills | Status: DC
Start: 2023-12-09 — End: 2023-12-16

## 2023-12-09 MED ORDER — SENNOSIDES-DOCUSATE SODIUM 8.6-50 MG PO TAB
1 | Freq: Every day | ORAL | 0 refills | Status: DC | PRN
Start: 2023-12-09 — End: 2023-12-13

## 2023-12-09 MED ORDER — LABETALOL 5 MG/ML IV SOLN
10 mg | INTRAVENOUS | 0 refills | Status: DC | PRN
Start: 2023-12-09 — End: 2023-12-18

## 2023-12-09 MED ORDER — FENTANYL 25 MCG/HR TD PT72
1 | TRANSDERMAL | 0 refills | Status: CN
Start: 2023-12-09 — End: ?

## 2023-12-09 MED ORDER — POLYETHYLENE GLYCOL 3350 17 GRAM PO PWPK
1 | Freq: Every day | ORAL | 0 refills | Status: DC | PRN
Start: 2023-12-09 — End: 2023-12-13

## 2023-12-09 MED ORDER — POTASSIUM CHLORIDE IN WATER 10 MEQ/50 ML IV PGBK
10 meq | INTRAVENOUS | 0 refills | Status: CP
Start: 2023-12-09 — End: ?
  Administered 2023-12-09: 14:00:00 10 meq via INTRAVENOUS

## 2023-12-09 MED ORDER — HYDROMORPHONE (PF) 2 MG/ML IJ SYRG
1 mg | INTRAVENOUS | 0 refills | Status: DC | PRN
Start: 2023-12-09 — End: 2023-12-11
  Administered 2023-12-09 – 2023-12-11 (×8): 1 mg via INTRAVENOUS

## 2023-12-09 MED ORDER — DIPHENHYDRAMINE/LIDOCAINE/MAG,AL-SIMETH(#) 8.3-66.6-267-26.7 MG/10ML PO SUSP
10 mL | Freq: Three times a day (TID) | ORAL | 0 refills | Status: DC | PRN
Start: 2023-12-09 — End: 2024-01-02
  Administered 2023-12-12: 18:00:00 10 mL via ORAL

## 2023-12-09 MED ORDER — POTASSIUM CHLORIDE IN WATER 10 MEQ/50 ML IV PGBK
10 meq | INTRAVENOUS | 0 refills | Status: CP
Start: 2023-12-09 — End: ?
  Administered 2023-12-09: 15:00:00 10 meq via INTRAVENOUS

## 2023-12-09 MED ORDER — FENTANYL 25 MCG/HR TD PT72
1 | TRANSDERMAL | 0 refills | Status: DC
Start: 2023-12-09 — End: 2023-12-16
  Administered 2023-12-09 – 2023-12-15 (×4): 1 via TRANSDERMAL

## 2023-12-09 MED ADMIN — MORPHINE 4 MG/ML IV SYRG [320654]: 4 mg | INTRAVENOUS | @ 19:00:00 | Stop: 2023-12-09 | NDC 00409189103

## 2023-12-09 NOTE — ED Notes
 Spoke with Daryl Baker about patients concern regarding his chemo and radiation schedule, she stated they were holding off on both for his procedure tomorrow. Patient updated at this time. No further needs at this time, patient not wanting to eat anything due to pain, offered him broth, he declined.

## 2023-12-09 NOTE — Progress Notes
 General Progress Note    Name:  Daryl Baker     Today's Date:  12/09/2023  Admission Date: 12/08/2023  LOS: 1 day                     Assessment/Plan:    Principal Problem:    Oropharyngeal mass      68 y.o. 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 He has had increasing pain over the last few days prior to admission near his oropharyngeal mass on the left along with the left aspect of his tongue preventing any PO intake and has a scheduled G-tube placement.    Interval Events:   >Per IR, G-tube placement tomorrow; NPO at midnight  >PO PTA pain regimen converted to fentanyl patch 25 mcg & 1mg  dilaudid IV q4h  >Was seen by Dental-noted mucositis, Applied topical benzocaine to lingual #19 area and smoothed a small area of bone      Dysphagia   Oropharyngeal pain  -Due to left oropharyngeal cancer  -Patient endorses inability to take anything PO including oral meds  -Feels weak and fatigued  Plan  >IR consulted: G-tube placement planned for 3/12  >D/c Viscous lidocaine as patient reports it makes his nauseated  >Magic mouthwash TID PRN for oropharyngeal pain  >fentanyl patch + IV dilaudid 1mg  q4h PRN  >1.5 L IV Lactated Ringers for fluid supplementation  >IV labetalol PRN for SBP >180 as he cannot tolerate PO PTA antihypertensives       Left HPV-Mediated Oropharynx cancer  -Currently on chemo and radiation  -Follow in Oncology clinic with dr Neita Carp and radiation Oncology Dr Mariane Masters  -CT Neck revealed:  1.  Marked decrease size of the prior large left oropharyngeal mass. There   is a residual 1.7 x 1.5 cm lesion involving the left tonsil with central   fluid attenuation. Treated necrotic tumor is the leading consideration. A   post therapeutic retention cyst could appear similar. An abscess is   thought less likely though direct visualization and clinical correlation   is recommended regarding symptoms of infection.   2.  Improvement of bilateral cervical lymphadenopathy.   > Radiation Oncology with continue to perform radiation while inpatient.      Pancytopenia   -likely due to Chemo  -follow with daily CBC     Hypomagnesemia  >CTM     Constipation  -PRN MiraLAX and Senokot     Hypertension  -resume PTA Metoprolol and Losartan      DM type II  -most recent hgba1c from 10/03/23 was 6.8  -PTA on Metformin and Trulicity   -currently no PO intake d/t pain  >hold above medications  >Accuchecks + low dose correction factor    FEN:  IVF 1.5 L LR  DIET NPO AT MIDNIGHT Sips With Medications w/ Body mass index is 31.38 kg/m?Marland Kitchen   PPX:  40mg  qday enoxaparin, SCDs bilat  Full Code   DISP:  Day 1 of hospital stay.   Barriers to discharge: poor PO intake  Follow up appt w/ PCP: Felecia Jan [phone] and subspecialists.    ________________________________________________________________________    Subjective  Daryl Baker is a 68 y.o. male.  Patient reports continued, progressive, significant oral pain when eating preventing any oral intake. He also reports that he cannot tolerate the viscous lidocaine as he finds it nauseating. He has  not had any oral intake in a few days.        Medications  Scheduled Meds:enoxaparin (LOVENOX) syringe 40 mg, 40 mg, Subcutaneous, QDAY(21)  insulin aspart (U-100) (NOVOLOG FLEXPEN U-100 INSULIN) injection PEN 0-6 Units, 0-6 Units, Subcutaneous, ACHS (22)  lidocaine hcl viscous 2 % solution, , Swish & Spit, ONCE  losartan (COZAAR) tablet 100 mg, 100 mg, Oral, QDAY  metoprolol succinate XL (TOPROL XL) tablet 100 mg, 100 mg, Oral, QDAY  morphine SR (MS CONTIN) tablet 30 mg, 30 mg, Oral, Q12H*  polyethylene glycol 3350 (MIRALAX) packet 17 g, 1 packet, Oral, BID  sennosides-docusate sodium (SENOKOT-S) tablet 1 tablet, 1 tablet, Oral, BID    Continuous Infusions:  PRN and Respiratory Meds:dextrose 50% (D50) IV PRN, lidocaine hcl viscous QID PRN, melatonin QHS PRN, ondansetron Q6H PRN **OR** ondansetron (ZOFRAN) IV Q6H PRN, oxyCODONE Q4H PRN, sodium chloride PRN        Objective:                          Vital Signs: Last Filed                 Vital Signs: 24 Hour Range   BP: 153/80 (03/11 0600)  Temp: 36.8 ?C (98.3 ?F) (03/10 1610)  Pulse: 91 (03/11 0600)  Respirations: 8 PER MINUTE (03/11 0600)  SpO2: 96 % (03/11 0600)  O2 Device: Nasal cannula (03/11 0353)  O2 Liter Flow: 2 Lpm (03/11 0353) BP: (112-155)/(53-93)   Temp:  [36.7 ?C (98.1 ?F)-36.8 ?C (98.3 ?F)]   Pulse:  [87-125]   Respirations:  [8 PER MINUTE-23 PER MINUTE]   SpO2:  [81 %-100 %]   O2 Device: Nasal cannula  O2 Liter Flow: 2 Lpm   Intensity Pain Scale (Self Report): 8 (12/08/23 0905) Vitals:    12/08/23 0907   Weight: 102 kg (224 lb 13.9 oz)       Intake/Output Summary:  (Last 24 hours)    Intake/Output Summary (Last 24 hours) at 12/09/2023 0647  Last data filed at 12/09/2023 9604  Gross per 24 hour   Intake 1200 ml   Output --   Net 1200 ml           Physical Exam   General: well developed in No distress w/ Body mass index is 31.38 kg/m?Marland Kitchen   Skin: no rash or wounds, normal capillary refill  HEENT: erythematous lesion on left side of tongue consistent with mucositis  Neck: supple  Heart: S1 S2, no murmur  Lungs: clear bilat  Abd: soft, NT, ND, +bs, no hepatosplenomegaly  Musculoskeletal: full ROM  Vascular: radial and pedal pulses palpable, no edema  Neuro: alert and oriented, MAE, speech intact       Point of Care Testing  (Last 24 hours)  Glucose: 91 (12/09/23 0349)  POC Glucose (Download): (!) 115 (12/08/23 2125)    Radiology and other Diagnostics Review:    CT NECK W/CONTRAST  Result Date: 12/08/2023  1.  Marked decrease size of the prior large left oropharyngeal mass. There is a residual 1.7 x 1.5 cm lesion involving the left tonsil with central fluid attenuation. Treated necrotic tumor is the leading consideration. A post therapeutic retention cyst could appear similar. An abscess is thought less likely though direct visualization and clinical correlation is recommended regarding symptoms of infection. 2.  Improvement of bilateral cervical lymphadenopathy. By my electronic signature, I attest that I have personally reviewed the images for this examination and  formulated the interpretations and opinions expressed in this report  Finalized by Marily Memos, M.D. on 12/08/2023 12:00 PM. Dictated by Jerene Pitch, MD on 12/08/2023 11:07 AM.    CT HEAD WO CONTRAST  Result Date: 12/08/2023  1.  No acute intracranial hemorrhage or mass effect. 2.  Mild patchy cerebral white matter hypodensities, likely sequelae of chronic microvascular ischemia. 3.  The CT neck is dictated separately. Please see that report. By my electronic signature, I attest that I have personally reviewed the images for this examination and formulated the interpretations and opinions expressed in this report  Finalized by Marily Memos, M.D. on 12/08/2023 12:00 PM. Dictated by Jerene Pitch, MD on 12/08/2023 10:51 AM.       Justine Null, MD   Internal Medicine-Psychiatry Resident, PGY1  Available on Voalte     Seen & discussed with Dr. Jeannette Corpus

## 2023-12-09 NOTE — Care Coordination-Inpatient
 Med Private AOD [phone] Peyton Bottoms (available on Voalte) will take calls on this patient until 7:30 am on 12/09/2023. Afterwards, please contact Med-Oncology team for any questions or concerns. Voalte is the preferred way of communication.     Tery Sanfilippo, M.D.  Night AOD  On Voalte ---- 8 pm to 8 am    .

## 2023-12-09 NOTE — Progress Notes
 Patient/family declined:  Bed/chair alarm and W/in arms reach during toileting/showering.    Patient/family educated on importance of intervention to their safety/quality of care. Patient/family continues to decline care.    Reason Why Patient/Family declined:  Wants to be able to stand up to urinate.    Individualized safety and/or care plan implemented. If additional safety measures implemented, please list them.     Escalated to:  Unit coordinator/charge nurse.

## 2023-12-09 NOTE — ED Notes
 Pt reports being nauseous and is requesting to wait until sensation subsides; Pt also requesting IV pain control at this moment. Provider made aware or request.

## 2023-12-10 ENCOUNTER — Encounter: Admit: 2023-12-10 | Discharge: 2023-12-10 | Payer: MEDICARE

## 2023-12-10 ENCOUNTER — Inpatient Hospital Stay: Admit: 2023-12-10 | Discharge: 2023-12-10 | Payer: MEDICARE

## 2023-12-10 ENCOUNTER — Ambulatory Visit: Admit: 2023-12-10 | Discharge: 2023-12-10 | Payer: MEDICARE

## 2023-12-10 LAB — BASIC METABOLIC PANEL
~~LOC~~ BKR ANION GAP: 15 — ABNORMAL HIGH (ref 3–12)
~~LOC~~ BKR BLD UREA NITROGEN: 9 mg/dL (ref 7–25)
~~LOC~~ BKR CALCIUM: 7.9 mg/dL — ABNORMAL LOW (ref 8.5–10.6)
~~LOC~~ BKR CHLORIDE: 100 mmol/L (ref 98–110)
~~LOC~~ BKR CO2: 20 mmol/L — ABNORMAL LOW (ref 21–30)
~~LOC~~ BKR CREATININE: 0.5 mg/dL (ref 0.40–1.24)
~~LOC~~ BKR GLOMERULAR FILTRATION RATE (GFR): >60 mL/min (ref >60)
~~LOC~~ BKR GLUCOSE, RANDOM: 98 mg/dL (ref 70–100)
~~LOC~~ BKR POTASSIUM: 4.2 mmol/L (ref 3.5–5.1)
~~LOC~~ BKR SODIUM, SERUM: 135 mmol/L — ABNORMAL LOW (ref 137–147)

## 2023-12-10 LAB — COMPREHENSIVE METABOLIC PANEL
~~LOC~~ BKR ALBUMIN: 2.6 g/dL — ABNORMAL LOW (ref 3.5–5.0)
~~LOC~~ BKR ALK PHOSPHATASE: 40 U/L — ABNORMAL HIGH (ref 25–110)
~~LOC~~ BKR ALT: 8 U/L — ABNORMAL LOW (ref 7–56)
~~LOC~~ BKR ANION GAP: 10 10*3/uL — ABNORMAL LOW (ref 3–12)
~~LOC~~ BKR AST: 10 U/L — ABNORMAL HIGH (ref 7–40)
~~LOC~~ BKR BLD UREA NITROGEN: 7 mg/dL — ABNORMAL HIGH (ref 7–25)
~~LOC~~ BKR CHLORIDE: 107 mmol/L — ABNORMAL HIGH (ref 98–110)
~~LOC~~ BKR CO2: 19 mmol/L — ABNORMAL LOW (ref 21–30)
~~LOC~~ BKR GLOMERULAR FILTRATION RATE (GFR): >60 mL/min — ABNORMAL HIGH (ref >60–0.45)
~~LOC~~ BKR POTASSIUM: 3.3 mmol/L — ABNORMAL LOW (ref 3.5–5.1)
~~LOC~~ BKR TOTAL BILIRUBIN: 0.7 mg/dL — ABNORMAL LOW (ref 0.2–1.3)
~~LOC~~ BKR TOTAL PROTEIN: 4.5 g/dL — ABNORMAL LOW (ref 6.0–8.0)

## 2023-12-10 LAB — CBC AND DIFF
~~LOC~~ BKR ABSOLUTE BASO COUNT: 0 10*3/uL — ABNORMAL LOW (ref 0.00–0.20)
~~LOC~~ BKR HEMATOCRIT: 21 % — ABNORMAL LOW (ref 40.0–50.0)
~~LOC~~ BKR HEMOGLOBIN: 7.7 g/dL — ABNORMAL LOW (ref 13.5–16.5)
~~LOC~~ BKR MPV: 7.6 fL — ABNORMAL LOW (ref 7.0–11.0)
~~LOC~~ BKR PLATELET COUNT: 98 10*3/uL — ABNORMAL LOW (ref 150–400)
~~LOC~~ BKR RBC COUNT: 2.2 10*6/uL — ABNORMAL LOW (ref 4.40–5.50)
~~LOC~~ BKR WBC COUNT: 1.7 10*3/uL — ABNORMAL LOW (ref 4.50–11.00)

## 2023-12-10 LAB — POC GLUCOSE
~~LOC~~ BKR POC GLUCOSE: 83 mg/dL (ref 70–100)
~~LOC~~ BKR POC GLUCOSE: 102 mg/dL — ABNORMAL HIGH (ref 70–100)
~~LOC~~ BKR POC GLUCOSE: 108 mg/dL — ABNORMAL HIGH (ref 70–100)
~~LOC~~ BKR POC GLUCOSE: 115 mg/dL — ABNORMAL HIGH (ref 70–100)

## 2023-12-10 LAB — MAGNESIUM: ~~LOC~~ BKR MAGNESIUM: 1.4 mg/dL — ABNORMAL LOW (ref 1.6–2.6)

## 2023-12-10 MED ORDER — IOHEXOL 300 MG IODINE/ML IV SOLN
20 mL | Freq: Once | 0 refills | Status: CP
Start: 2023-12-10 — End: ?
  Administered 2023-12-10: 14:00:00 20 mL

## 2023-12-10 MED ORDER — POTASSIUM CHLORIDE IN WATER 10 MEQ/50 ML IV PGBK
10 meq | INTRAVENOUS | 0 refills | Status: CP
Start: 2023-12-10 — End: ?
  Administered 2023-12-10: 20:00:00 10 meq via INTRAVENOUS

## 2023-12-10 MED ORDER — HYDROMORPHONE (PF) 2 MG/ML IJ SYRG
.5 mg | Freq: Once | INTRAVENOUS | 0 refills | Status: CP
Start: 2023-12-10 — End: ?
  Administered 2023-12-11: 0.5 mg via INTRAVENOUS

## 2023-12-10 MED ORDER — POTASSIUM CHLORIDE IN WATER 10 MEQ/50 ML IV PGBK
10 meq | INTRAVENOUS | 0 refills | Status: DC
Start: 2023-12-10 — End: 2023-12-10

## 2023-12-10 MED ORDER — PANCRELIPASE-SODIUM BICARBONATE 20,880 K UNIT-650 MG
GASTROSTOMY | 0 refills | Status: DC | PRN
Start: 2023-12-10 — End: 2024-01-02

## 2023-12-10 MED ORDER — TUBE FEED (ENAR)
0 refills | Status: DC
Start: 2023-12-10 — End: 2023-12-13

## 2023-12-10 MED ORDER — POTASSIUM CHLORIDE IN WATER 10 MEQ/50 ML IV PGBK
10 meq | INTRAVENOUS | 0 refills | Status: CP
Start: 2023-12-10 — End: ?
  Administered 2023-12-10: 17:00:00 10 meq via INTRAVENOUS

## 2023-12-10 MED ORDER — MIDAZOLAM 1 MG/ML IJ SOLN
0 refills | Status: CP
Start: 2023-12-10 — End: ?

## 2023-12-10 MED ORDER — MIDAZOLAM 1 MG/ML IJ SOLN
1 mg | Freq: Once | INTRAVENOUS | 0 refills | Status: CP
Start: 2023-12-10 — End: ?
  Administered 2023-12-10: 13:00:00 1 mg via INTRAVENOUS

## 2023-12-10 MED ORDER — THIAMINE HCL (VITAMIN B1) 100 MG/ML IJ SOLN
100 mg | Freq: Every day | INTRAVENOUS | 0 refills | Status: CP
Start: 2023-12-10 — End: ?
  Administered 2023-12-11 – 2023-12-14 (×5): 100 mg via INTRAVENOUS

## 2023-12-10 MED ORDER — FENTANYL CITRATE (PF) 50 MCG/ML IJ SOLN
0 refills | Status: CP
Start: 2023-12-10 — End: ?

## 2023-12-10 MED ORDER — MAGNESIUM SULFATE IN D5W 1 GRAM/100 ML IV PGBK
1 g | INTRAVENOUS | 0 refills | Status: CP
Start: 2023-12-10 — End: ?
  Administered 2023-12-10: 17:00:00 1 g via INTRAVENOUS

## 2023-12-10 MED ORDER — GLUCAGON HCL 1 MG/ML IJ SOLR
0 refills | Status: CP
Start: 2023-12-10 — End: ?

## 2023-12-10 MED ORDER — MAGNESIUM SULFATE IN D5W 1 GRAM/100 ML IV PGBK
1 g | INTRAVENOUS | 0 refills | Status: CP
Start: 2023-12-10 — End: ?
  Administered 2023-12-10: 21:00:00 1 g via INTRAVENOUS

## 2023-12-10 MED ORDER — POTASSIUM CHLORIDE IN WATER 10 MEQ/50 ML IV PGBK
10 meq | INTRAVENOUS | 0 refills | Status: CP
Start: 2023-12-10 — End: ?
  Administered 2023-12-10: 16:00:00 10 meq via INTRAVENOUS

## 2023-12-10 MED ORDER — HYDROMORPHONE (PF) 2 MG/ML IJ SYRG
.5 mg | Freq: Once | INTRAVENOUS | 0 refills | Status: CP
Start: 2023-12-10 — End: ?
  Administered 2023-12-10: 19:00:00 0.5 mg via INTRAVENOUS

## 2023-12-10 NOTE — Unmapped
 Immediate Post Procedure Note    Date:  12/10/2023                                       Performing Provider:  Bing Matter, M.D.    Procedure(s):  g tube  Pre/Post Procedure Diagnosis:  dysphagia       Anesthesia: sedation    Findings:  none  Specimen(s) Removed:  None    Estimated Blood Loss:  None/Negligible  Complications: None  Time out performed: Consent obtained, correct patient verified, correct procedure verified, correct site verified, patient marked as necessary.      Bing Matter, M.D.  Neurointerventional Radiology

## 2023-12-10 NOTE — Progress Notes
 VSS. Pain level improved. No c/o nausea, chest pain, SOA. Dressing CDI. Pt denies any questions or concerns at this time. Pt transported back to inpt room, via bed.

## 2023-12-10 NOTE — Progress Notes
 Sedation physician present in room. Recent vitals and patient condition reviewed between sedating physician and nurse. Reassessment completed. Determination made to proceed with planned sedation.

## 2023-12-10 NOTE — Progress Notes
 Daryl Baker.received radiation therapy treatment today.  26 of 33 treatments.

## 2023-12-10 NOTE — Progress Notes
 General Progress Note    Name:  Daryl Baker     Today's Date:  12/10/2023  Admission Date: 12/08/2023  LOS: 2 days                     Assessment/Plan:    Principal Problem:    Oropharyngeal mass      68 y.o. 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 He has had increasing pain over the last few days prior to admission near his oropharyngeal mass on the left along with the left aspect of his tongue preventing any PO intake and has a scheduled G-tube placement.    Interval Events:   -G-tube placed this AM.  -Radiation treatment yesterday & today.  -PO PTA pain regimen converted to fentanyl patch 25 mcg & 1mg  dilaudid IV q4h      Dysphagia   Oropharyngeal pain  -Due to left oropharyngeal cancer  -Patient endorses inability to take anything PO including oral meds  -Feels weak and fatigued  -G-tube placed 3/12 AM  Plan  >Magic mouthwash TID PRN for oropharyngeal pain  >fentanyl patch + IV dilaudid 1mg  q4h PRN  >1.5 L IV Lactated Ringers for fluid supplementation  >IV labetalol PRN for SBP >180 as he cannot tolerate PO PTA antihypertensives  > Start tube feeds at 0906 12/11/23   > Consulted Dietician  >continuous standard infusion of TwoCal HN with goal rate 55 mL/hr x 24 hrs.   >Start at 10 mL/hr, advancing rate as tolerated by 10 mL/hr Q8hrs to goal rate  >100mg  thiamine daily x 5-7 days  >Mg, Phos, K daily       Left HPV-Mediated Oropharyngeal SCC  -Currently on chemo and radiation  -Follow in Oncology clinic with dr Neita Carp and radiation Oncology Dr Mariane Masters  -CT Neck revealed:  1.  Marked decrease size of the prior large left oropharyngeal mass. There   is a residual 1.7 x 1.5 cm lesion involving the left tonsil with central   fluid attenuation. Treated necrotic tumor is the leading consideration. A   post therapeutic retention cyst could appear similar. An abscess is   thought less likely though direct visualization and clinical correlation   is recommended regarding symptoms of infection.   2.  Improvement of bilateral cervical lymphadenopathy.   > Radiation Oncology with continue to perform radiation while inpatient.      Pancytopenia   -likely due to Chemo  -follow with daily CBC     Hypomagnesemia  >CTM     Constipation  -PRN MiraLAX and Senokot     Hypertension  -resume PTA Metoprolol and Losartan      DM type II  -most recent hgba1c from 10/03/23 was 6.8  -PTA on Metformin and Trulicity   -currently no PO intake d/t pain  >hold above medications  >Accuchecks + low dose correction factor    FEN:  IVF 1.5 L LR  DIET NPO AT MIDNIGHT Sips With Medications w/ Body mass index is 31.66 kg/m?Marland Kitchen   PPX:  40mg  qday enoxaparin, SCDs bilat  Full Code   DISP:  Day 2 of hospital stay.   Barriers to discharge: poor PO intake  Follow up appt w/ PCP: Felecia Jan [phone] and subspecialists.    ________________________________________________________________________    Subjective  Daryl Baker is a 68 y.o. male.  Patient had G-tube placed today.  He continues to report pain which he says is fairly well controlled by the current dilaudid dosing but seems to wear off before the next dose. He notes that it is very painful to speak. He reports that the G-tube placement went well & is looking forward to starting tube feeds in the AM.        Medications  Scheduled Meds:enoxaparin (LOVENOX) syringe 40 mg, 40 mg, Subcutaneous, QDAY(21)  fentaNYL (DURAGESIC) 25 mcg/hr patch 1 patch, 1 patch, Transdermal, Q72H*   And  Verification of Patch Placement and Integrity - fentaNYL 25 mcg/hr, , Transdermal, BID  insulin aspart (U-100) (NOVOLOG FLEXPEN U-100 INSULIN) injection PEN 0-6 Units, 0-6 Units, Subcutaneous, ACHS (22)  [Held by Provider] losartan (COZAAR) tablet 100 mg, 100 mg, Oral, QDAY  [Held by Provider] metoprolol succinate XL (TOPROL XL) tablet 100 mg, 100 mg, Oral, QDAY    Continuous Infusions:  PRN and Respiratory Meds:dextrose 50% (D50) IV PRN, diphenhydrAMINE/lidocaine/mag,al-simeth(#) TID PRN, HYDROmorphone (DILAUDID) injection Q4H PRN, labetalol (NORMODYNE; TRANDATE) injection Q6H PRN, melatonin QHS PRN, ondansetron Q6H PRN **OR** ondansetron (ZOFRAN) IV Q6H PRN, polyethylene glycol 3350 QDAY PRN, sennosides-docusate sodium QDAY PRN, sodium chloride PRN        Objective:                          Vital Signs: Last Filed                 Vital Signs: 24 Hour Range   BP: 123/71 (03/12 0743)  Temp: 36.4 ?C (97.5 ?F) (03/12 1478)  Pulse: 79 (03/12 0743)  Respirations: 16 PER MINUTE (03/12 0743)  SpO2: 100 % (03/12 0743)  O2 Device: None (Room air) (03/12 0743)  Height: 179.1 cm (5' 10.5) (03/11 1715) BP: (123-168)/(64-88)   Temp:  [36.4 ?C (97.5 ?F)-36.7 ?C (98 ?F)]   Pulse:  [79-97]   Respirations:  [9 PER MINUTE-17 PER MINUTE]   SpO2:  [93 %-100 %]   O2 Device: None (Room air)   Intensity Pain Scale (Self Report): 5 (12/09/23 1127) Vitals:    12/08/23 0907 12/08/23 1445 12/09/23 1715   Weight: 102 kg (224 lb 13.9 oz) 101.5 kg (223 lb 12.8 oz) 101.5 kg (223 lb 12.8 oz)       Intake/Output Summary:  (Last 24 hours)    Intake/Output Summary (Last 24 hours) at 12/10/2023 0746  Last data filed at 12/10/2023 0439  Gross per 24 hour   Intake 0 ml   Output 360 ml   Net -360 ml           Physical Exam   General: well developed in No distress w/ Body mass index is 31.66 kg/m?Marland Kitchen   Skin: no rash or wounds, normal capillary refill  HEENT: erythematous lesion on left side of tongue consistent with mucositis  Neck: supple  Heart: S1 S2, no murmur  Lungs: clear bilat  Abd: soft, NT, ND, +bs, no hepatosplenomegaly  Musculoskeletal: full ROM  Vascular: radial and pedal pulses palpable, no edema  Neuro: alert and oriented, MAE, speech intact       Point of Care Testing  (Last 24 hours)  Glucose: 76 (12/10/23 0424)  POC Glucose (Download): 83 (12/09/23 2117)    Radiology and other Diagnostics Review:    CT NECK W/CONTRAST  Result Date: 12/08/2023  1.  Marked decrease size of the prior large left oropharyngeal mass. There is a residual 1.7 x 1.5 cm lesion involving the left tonsil with  central fluid attenuation. Treated necrotic tumor is the leading consideration. A post therapeutic retention cyst could appear similar. An abscess is thought less likely though direct visualization and clinical correlation is recommended regarding symptoms of infection. 2.  Improvement of bilateral cervical lymphadenopathy. By my electronic signature, I attest that I have personally reviewed the images for this examination and formulated the interpretations and opinions expressed in this report  Finalized by Marily Memos, M.D. on 12/08/2023 12:00 PM. Dictated by Jerene Pitch, MD on 12/08/2023 11:07 AM.    CT HEAD WO CONTRAST  Result Date: 12/08/2023  1.  No acute intracranial hemorrhage or mass effect. 2.  Mild patchy cerebral white matter hypodensities, likely sequelae of chronic microvascular ischemia. 3.  The CT neck is dictated separately. Please see that report. By my electronic signature, I attest that I have personally reviewed the images for this examination and formulated the interpretations and opinions expressed in this report  Finalized by Marily Memos, M.D. on 12/08/2023 12:00 PM. Dictated by Jerene Pitch, MD on 12/08/2023 10:51 AM.       Justine Null, MD   Internal Medicine-Psychiatry Resident, PGY1  Available on Voalte     Seen & discussed with Dr. Jeannette Corpus

## 2023-12-10 NOTE — Progress Notes
 Patient returned to IR for recovery post g-tube placement. Report given to bedside nurse. Hemostasis achieved at 0906. Bedrest complete at 1106. Strict NPO for 12 hrs, tube will be connected to a drainage bag. Drainage bag can be taken off and g-tube can be flushed with 50 cc of water at 2106. Clear liquid diet can begin as tolerated after strict 12 hr NPO per MD orders. G-tube can be used for feedings on 0906 12/11/23. Please call pre/post IR for any questions.

## 2023-12-10 NOTE — Consults
 CLINICAL NUTRITION                                                        Clinical Nutrition Initial Assessment    Name: Daryl Baker   MRN: [id number]     DOB: 1956-02-10      Age: 68 y.o.  Admission Date: 12/08/2023     LOS: 2 days     Date of Service: 12/10/2023        Recommendation:  Least restrictive PO diet per SLP/primary team.   When able to initiate EN, recommend continuous standard infusion of TwoCal HN with goal rate 55 mL/hr x 24 hrs.   Start at 10 mL/hr, advancing rate as tolerated by 10 mL/hr Q8hrs to goal rate. (If unable to tolerate 10 mL/hr advancement Q8hrs, adjust advancement to Q12 hrs instead.)   Goal rate provides 2640 kcal/day, 110 g protein/day, and 924 mL free water/day.   Provide at least 30 mL water flush Q4hrs for tube patency. Additional fluids per primary team.   Pt is at risk for refeeding syndrome. Start 100mg  thiamine daily x 5-7 days. Monitor Mg, Phos, K+ closely and replace PRN with goal to keep K+ >4, Phos >3 and Mag >2.       When labs stabilized and able to consistently tolerate continuous EN at goal rate 55 mL/hr, may transition to bolus feeds if deemed appropriate by primary team.   When/if appropriate to transition to bolus feeds, recommend goal of 5.5 cartons of TwoCal HN.   Rec TwoCal HN goal of 1 carton (237 mL) x 4 feeds per day + 1.5 cartons (~356 mL) once daily for a total of 5.5 cartons per day.Provide 1/2 carton (~118 mL) at first two feedings. If pt is able to tolerate first 2 feedings, provide 1 carton (237 mL) at third feeding. Can provide up to 1.5 cartons (~356 mL) per feeding depending on pt's tolerance.   Goal provides 2613 kcal/day, 109 g protein/day, and 913 mL free water/day.    Provide at least 30 ml free water before and after each bolus feed. Additional fluids per primary team.  Consider 50 mL water flush before and after each bolus feed. Consider providing additional 245 mL water bolus QID for hydration needs.   Monitor wt, intake, intake tolerance, GI function, S/S of refeeding syndrome, and lab data.  Recommend ongoing follow-up with outpatient dietitian for tube feeding management.    Comments:  68 y.o. 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 intake admitted for further management. Patient endorses decreased p.o. intake secondary to left oropharyngeal mass. Labs and meds reviewed. Noted K low at 3.3, Corrected Ca 7.6, and Mg 1.4. Now s/p G-tube placement 12/10/23.     Clinical nutrition consulted for tube feeding recommendations. Pt off unit, so RD called pt's wife and obtained subjective Hx from wife. She reports this is day 6 of the pt not really eating anything. Reports intake/appetite greatly decreased overall in the past month. Reports poor appetite, taste changes, sore mouth, and sore throat which has resulted in poor PO intakes. 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 Reports pt has lost over 50 lb in the past several months. Reports UBW of 282 lb prior to the past several months. If this is accurate, pt has had 20.6% loss of BW in less than a year (significant for time frame). Wife denies the pt having any GI issues other than constipation. Denies pt having any food allergies/intolerances.     RD provided symptom management educational materials (e.g. taste changes, sore mouth/throat, poor appetite), tips on increasing kcal and protein, and ONS coupons on bedside table. Pt's wife aware. Unable to perform nutrition-focused physical exam (NFPE) since pt off unit, but pt meets criteria for malnutrition even without NFPE. EN recs provided. Will continue to monitor.    Nutrition Assessment of Patient:  Admit Weight: 102 kg;  DBW: 79.9 kg  BMI (Calculated): 31.66;    Pertinent Allergies/Intolerances: None noted     Oral Diet Order: NPO; Current Energy Intake: NPO           Weight Used for Calculation: 79.9 kg  Estimated Calorie Needs: 2395-2795 kcal (30-35 kcal/kg DBW)  Estimated Protein Needs: 104-120 g protein (1.3-1.5 g protein/kg DBW)    Malnutrition Assessment:   Malnutrition present on admission  ICD-10 code E43: Chronic illness/Severe malnutrition        Energy intake: 75% or less of estimated energy requirement for 1 month or more, Weight loss: Greater than 20% x 1 year        Malnutrition Interventions: EN recs provided    Nutrition Focused Physical Exam:  pending   Physical Assessment:        Pressure Injury: None noted     Comment: LBM PTA    Nutrition Diagnosis:  Inadequate oral intake  Etiology: mouth pain, PO intake intolerance, poor appetite  Signs & Symptoms: pt report per chart review, wife report, need for G-tube placement and EN recs                      Intervention / Plan:  EN recs  Will continue to monitor wt, intake, intake tolerance, and lab data           Reginal Lutes, MS, RDN, LD  Clinical Dietitian - Department of Clinical Nutrition  Available on Voalte

## 2023-12-10 NOTE — Case Management (ED)
 Case Management Admission Assessment    NAME:Daryl Baker                          MRN: [id number]             DOB:06/04/1956          AGE: 68 y.o.  ADMISSION DATE: 12/08/2023             DAYS ADMITTED: LOS: 2 days      Today?s Date: 12/10/2023    Source of Information: Patients wife via phone       69 y.o.   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 intake admitted for further management.     Plan  Plan: Case Management Assessment, Discharge Planning for Home with Post-Acute Care Needs  Pt will require EN on DC with bedside teach.  Bonita Quin has no preference on company and has requested this NCM choose a company based on pts insurance plan.    Pt lives in a multilevel home w/his wife Bonita Quin.  She reports the stairs in the home are awful, however does not quantify.  Pt is able to enter the home thru the back and be on the level his apartment is on.  It accomodates a full bath with walk in shower that has grab bars and a shower chair.    Pt is mostly independent, but Bonita Quin is available to assist if needed.  Per Bonita Quin, pt is weak from chemo and radiation and also has peripheral neuropathy r/t diabetes and this makes it harder for him to mobilize.  Pt also owns a walker which he uses around the home and when he is out.  He has a SPC and a WC as well.   Pt [place] current with his new PCP, Dr. Alphonzo Lemmings and receives cancer care from Dr. Neita Carp @ [facility].   Patient and his wife reside at [facility] when he is receiving radiation.  Their permanent home is in [facility]  Bonita Quin will transport pt home on DC.    Patient Address/Phone  [address]  [place]  [place] [address]  [phone] (home)     Emergency Contact  Extended Emergency Contact Information  Primary Emergency Contact: Mcdade,linda  Home Phone: [phone]  Mobile Phone: [phone]  Relation: Spouse    Healthcare Directive  Healthcare Directive: Yes, patient has a healthcare directive  Type of Healthcare Directive: Durable power of attorney for healthcare  Location of Healthcare Directive: Patient does not have it with him/her  Would patient like to fill out a (a new) Healthcare Directive?: N/A  Psych Advance Directive (Psych unit only): No, patient does not have a Social research officer, government  Does the Patient Need Case Management to Arrange Discharge Transport? (ex: facility, ambulance, wheelchair/stretcher, Medicaid, cab, other): No  Transportation Name, Phone and Availability #[email or web address], justiniano (Spouse)  [phone] (Mobile)    Expected Discharge Date  12/13/2023     Living Situation Prior to Admission  Living Arrangements  Type of Residence: Home, independent  Living Arrangements: Spouse/significant other  Financial risk analyst / Tub: Tub/Shower Unit  Can patient live on one level if needed?: Yes  Does residence have entry and/or inside stairs?: No  Assistance needed prior to admit or anticipated on  discharge: No  Who provides assistance or could if needed?: Wife  Are they in good health?: Unknown  Can support system provide 24/7 care if needed?: Yes  Level of Function   Prior level of function: Independent  Cognitive Abilities   Cognitive Abilities: Alert and Oriented    Financial Resources  Coverage  Primary Insurance: Medicare Replacement  Secondary Insurance: No insurance  Additional Coverage: None  Medication Coverage    Medication Coverage: Medicare Part D  Have you experienced a noticeable increase in your copay costs recently?: No  Are current medications affordable?: Yes  Do You Use a Co-Pay Card or a Medication Assistance Program to Help Manage Medication Costs?: No  Do You Manage Your Own Medications?: No  Who is Responsible for Ordering and Setting up Medications?: Wife  Source of Income   Source Of Income: Other (comment) (self employed)  Financial Assistance Needed?  Wife denies    Psychosocial Needs  Mental Health  Mental Health History: No  Substance Use History  Substance Use History Screen: No  Other  Wife denies    Current/Previous Services  PCP  Felecia Jan, [phone], [phone]  Pharmacy    [facility] [place], [place] - [address] [place] [address]  [address] [place] [address]  [place] [address]  Phone: [phone] Fax: [phone]    Durable Medical Equipment   Durable Medical Equipment at home: Grab bars, Single [address], Wheelchair (manual), Environmental consultant, [employer] Chair  Home Health  Receiving home health: No  Hemodialysis or Peritoneal Dialysis  Undergoing hemodialysis or peritoneal dialysis: No  Tube/Enteral Feeds  Receive tube/enteral feeds: No  Infusion  Receive infusions: No  Private Duty  Private duty help used: No  Home and Firefighter and community based services: No  Ryan White  Ryan White: N/A  Hospice  Hospice: No  Outpatient Therapy  PT: No  OT: No  SLP: No  Skilled Nursing Facility/Nursing Home  SNF: No  NH: No  Inpatient Rehab  IPR: No  Long-Term Acute Care Hospital  LTACH: No  Acute Hospital Stay  Acute Hospital Stay: No      Humphrey Rolls, RN BSN  Integrated Nurse Case Manager  Available on [place]  [phone]

## 2023-12-10 NOTE — Care Plan
 Problem: Discharge Planning  Goal: Participation in plan of care  Outcome: Goal Ongoing  Goal: Knowledge regarding plan of care  Outcome: Goal Ongoing  Goal: Prepared for discharge  Outcome: Goal Ongoing     Problem: Moderate Fall Risk  Goal: Moderate Fall Risk  Outcome: Goal Ongoing

## 2023-12-10 NOTE — Consults
 IR Pre-Procedure History and Physical/Sedation Plan    Procedure Date: 12/10/2023     Planned Procedure(s):  Gastrostomy tube placement     Procedural code status: Full Code    Indication:  Oral pain, decreased nutritional intake   __________________________________________________________________    Chief Complaint:  As above     History of Present Illness: Daryl Baker is a 68 y.o. male with a history as listed below who presents today for procedure.    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, associated with type 2 diabetes mellitus (HCC) 02/11/2023     Past Medical History:    DM (diabetes mellitus) (CMS-HCC)    Hyperlipidemia    Hypertension    Neurogenic amyotrophy    Neuropathy      Surgical History:   Procedure Laterality Date    ABDOMINAL HERNIA REPAIR      KNEE ARTHROSCOPY Left       Social History     Tobacco Use    Smoking status: Former     Current packs/day: 1.00     Types: Cigarettes     Passive exposure: Past    Smokeless tobacco: Never   Substance Use Topics    Alcohol use: Yes     Alcohol/week: 1.0 standard drink of alcohol     Types: 1 Shots of liquor per week      Family History   Problem Relation Name Age of Onset    Cancer Mother      Diabetes Mother      Hypertension Mother      Cancer-Lung Mother      Stroke Father      Hunting[place]'s [place] Maternal Grandfather        Medications Prior to Admission   Medication Sig Dispense Refill Last Dose/Taking    dexAMETHasone (DECADRON) 4 mg tablet Beginning 24 hours after each Cisplatin infusion, take 2 tablets by mouth daily for 3 days. 36 tablet 0 12/01/2023    FREESTYLE LIBRE 2 READER reader Use one each as directed every 6 hours.   Taking    lidocaine hcl viscous (LIDOCAINE VISCOUS) 2 % solution Swish and Spit 5 mL by mouth as directed four times daily as needed. 100 mL 3 Past Week    losartan (COZAAR) 50 mg tablet Take two tablets by mouth daily.   12/08/2023 Morning    metFORMIN-XR (GLUCOPHAGE XR) 750 mg extended release tablet Take one tablet by mouth daily with dinner.   12/07/2023 Bedtime    metoprolol succinate XL (TOPROL XL) 100 mg extended release tablet Take one tablet by mouth daily.   12/08/2023 Morning    morphine SR (MS CONTIN) 15 mg tablet Take one tablet by mouth every 12 hours.   Taking    morphine SR (MS CONTIN) 30 mg ER tablet Take one tablet by mouth every 12 hours for 30 days. Indications: severe chronic pain requiring long-term opioid treatment  Dose adjustment requiring early fill 60 tablet 0 12/08/2023 Morning    OLANZapine (ZYPREXA) 5 mg tablet Take 1 tablet by mouth daily at bedtime for 4 days starting on Cisplatin infusion day.  Indications: prevent nausea and vomiting from cancer chemotherapy 24 tablet 0 Taking    ondansetron HCL (ZOFRAN) 8 mg tablet Take one tablet by mouth every 8 hours  as needed (nausea and vomiting). Indications: prevent nausea and vomiting from cancer chemotherapy 90 tablet 0 Taking As Needed    oxyCODONE (ROXICODONE) 5 mg tablet Take one tablet to two tablets by mouth every 4-6 hours as needed for Pain. Indications: pain 120 tablet 0 12/08/2023 Morning    triamcinolone acetonide (TRIDERM) 0.1 % topical cream Apply one g topically to affected area twice daily. 80 g 3 Past Week    TRULICITY 3 mg/0.5 mL injection pen Inject 0.5 mL under the skin every 7 days. Sunday   12/07/2023 Morning     No Known Allergies    Review of Systems  Constitutional: negative  Ears, nose, mouth, throat, and face: + oral pain   Respiratory: negative  Cardiovascular: negative  Gastrointestinal: negative  Musculoskeletal:negative  Neurological: negative  Behavioral/Psych: negative      Previous Personal Anesthetic/Sedation History:  Denies adverse events related to sedation/anesthesia.     Previous Family Anesthetic/Sedation History: Denies adverse events related to sedation/anesthesia.    Physical Exam:  Vital Signs: Last Filed In 24 Hours Vital Signs: 24 Hour Range   BP: 143/80 (03/12 0802)  Temp: 36.7 ?C (98.1 ?F) (03/12 0802)  Pulse: 86 (03/12 0802)  Respirations: 9 PER MINUTE (03/12 0802)  SpO2: 97 % (03/12 0802)  O2 Device: None (Room air) (03/12 0802)  Height: 179.1 cm (5' 10.5) (03/11 1715) BP: (123-168)/(64-86)   Temp:  [36.4 ?C (97.5 ?F)-36.7 ?C (98.1 ?F)]   Pulse:  [79-94]   Respirations:  [9 PER MINUTE-16 PER MINUTE]   SpO2:  [93 %-100 %]   O2 Device: None (Room air)   Intensity Pain Scale (Self Report): 7 (12/10/23 0802)      General appearance: Alert and no distress noted.  Neurologic: Grossly normal.  Lungs: Non labored at rest.  Heart: Regular rate and rhythm  Abdomen: Non-distended    Airway: airway assessment performed  Mallampati II (soft palate, uvula, fauces visible)  Head and Neck: no abnormalities noted  Mouth: no abnormalities noted  NPO status: Acceptable  Pregnancy Status: N/A  Anesthesia Classification:  ASA III (A patient with a severe systemic disease that limits activity, but is not incapacitating)  Pre-operative anxiolysis Plan: Midazolam  Sedation/Medication Plan: Fentanyl, Lidocaine, and Midazolam  Discussion/Reviews:  Physician has discussed risks and alternatives of this type of sedation and above planned procedures with patient    Lab/Radiology/Other Diagnostic Tests:  Labs:  Pertinent labs reviewed           Kathleen Lime, APRN-NP  Pager [phone]

## 2023-12-11 ENCOUNTER — Encounter: Admit: 2023-12-11 | Discharge: 2023-12-11 | Payer: MEDICARE

## 2023-12-11 ENCOUNTER — Ambulatory Visit: Admit: 2023-12-11 | Discharge: 2023-12-11 | Payer: MEDICARE

## 2023-12-11 LAB — POC GLUCOSE
~~LOC~~ BKR POC GLUCOSE: 86 mg/dL (ref 70–100)
~~LOC~~ BKR POC GLUCOSE: 123 mg/dL — ABNORMAL HIGH (ref 70–100)
~~LOC~~ BKR POC GLUCOSE: 130 mg/dL — ABNORMAL HIGH (ref 70–100)
~~LOC~~ BKR POC GLUCOSE: 132 mg/dL — ABNORMAL HIGH (ref 70–100)
~~LOC~~ BKR POC GLUCOSE: 96 mg/dL (ref 70–100)

## 2023-12-11 LAB — CBC AND DIFF
~~LOC~~ BKR ABSOLUTE BASO COUNT: 0 10*3/uL — ABNORMAL LOW (ref 0.00–0.20)
~~LOC~~ BKR ABSOLUTE EOS COUNT: 0 10*3/uL — ABNORMAL LOW (ref 0.00–0.45)
~~LOC~~ BKR ABSOLUTE LYMPH COUNT: 0.1 10*3/uL — ABNORMAL LOW (ref 1.00–4.80)
~~LOC~~ BKR ABSOLUTE MONO COUNT: 0.4 10*3/uL — ABNORMAL LOW (ref 0.00–0.80)
~~LOC~~ BKR ABSOLUTE NEUTROPHIL: 1.3 10*3/uL — ABNORMAL LOW (ref 1.80–7.00)
~~LOC~~ BKR BASOPHILS %: 0.2 % — ABNORMAL LOW (ref 0.0–2.0)
~~LOC~~ BKR EOSINOPHILS %: 0.1 % — ABNORMAL LOW (ref 0.0–5.0)
~~LOC~~ BKR HEMATOCRIT: 23 % — ABNORMAL LOW (ref 40.0–50.0)
~~LOC~~ BKR HEMOGLOBIN: 8.5 g/dL — ABNORMAL LOW (ref 13.5–16.5)
~~LOC~~ BKR LYMPHOCYTES %: 4.5 % — ABNORMAL LOW (ref 24.0–44.0)
~~LOC~~ BKR MCH: 34 pg — ABNORMAL HIGH (ref 26.0–34.0)
~~LOC~~ BKR MCV: 94 fL — ABNORMAL LOW (ref 80.0–100.0)
~~LOC~~ BKR MONOCYTES %: 22 % — ABNORMAL HIGH (ref 4.0–12.0)
~~LOC~~ BKR MPV: 7.5 fL — ABNORMAL HIGH (ref 7.0–11.0)
~~LOC~~ BKR NEUTROPHILS %: 72 % — ABNORMAL HIGH (ref 41.0–77.0)
~~LOC~~ BKR RBC COUNT: 2.4 10*6/uL — ABNORMAL LOW (ref 4.40–5.50)
~~LOC~~ BKR WBC COUNT: 1.8 10*3/uL — ABNORMAL LOW (ref 4.50–11.00)

## 2023-12-11 LAB — MAGNESIUM: ~~LOC~~ BKR MAGNESIUM: 1.8 mg/dL (ref 1.6–2.6)

## 2023-12-11 LAB — PHOSPHORUS: ~~LOC~~ BKR PHOSPHORUS: 2.8 mg/dL (ref 2.0–4.5)

## 2023-12-11 LAB — BASIC METABOLIC PANEL: ~~LOC~~ BKR SODIUM, SERUM: 136 mmol/L — ABNORMAL LOW (ref 137–147)

## 2023-12-11 LAB — COMPREHENSIVE METABOLIC PANEL
~~LOC~~ BKR ALBUMIN: 2.8 g/dL — ABNORMAL LOW (ref 3.5–5.0)
~~LOC~~ BKR ALK PHOSPHATASE: 43 U/L — ABNORMAL LOW (ref 25–110)
~~LOC~~ BKR POTASSIUM: 3.8 mmol/L — ABNORMAL LOW (ref 3.5–5.1)
~~LOC~~ BKR SODIUM, SERUM: 136 mmol/L — ABNORMAL LOW (ref 137–147)
~~LOC~~ BKR TOTAL BILIRUBIN: 0.7 mg/dL — ABNORMAL HIGH (ref 0.2–1.3)

## 2023-12-11 MED ORDER — MAGNESIUM SULFATE IN D5W 1 GRAM/100 ML IV PGBK
1 g | INTRAVENOUS | 0 refills | Status: CP
Start: 2023-12-11 — End: ?
  Administered 2023-12-12: 01:00:00 1 g via INTRAVENOUS

## 2023-12-11 MED ORDER — POTASSIUM CHLORIDE IN WATER 10 MEQ/50 ML IV PGBK
10 meq | Freq: Once | INTRAVENOUS | 0 refills | Status: CP
Start: 2023-12-11 — End: ?
  Administered 2023-12-11: 23:00:00 10 meq via INTRAVENOUS

## 2023-12-11 MED ORDER — TRIAMCINOLONE ACETONIDE 0.1 % TP CREA
1 g | Freq: Two times a day (BID) | TOPICAL | 0 refills | Status: DC
Start: 2023-12-11 — End: 2023-12-28
  Administered 2023-12-11 – 2023-12-21 (×2): 1 g via TOPICAL

## 2023-12-11 MED ORDER — HYDROMORPHONE (PF) 2 MG/ML IJ SYRG
.5-1 mg | INTRAVENOUS | 0 refills | Status: DC | PRN
Start: 2023-12-11 — End: 2023-12-14
  Administered 2023-12-11 – 2023-12-14 (×11): 1 mg via INTRAVENOUS

## 2023-12-11 MED ORDER — OXYCODONE 5 MG/5 ML PO SOLN
5-10 mg | NASOGASTRIC | 0 refills | Status: DC | PRN
Start: 2023-12-11 — End: 2023-12-12
  Administered 2023-12-12 (×3): 10 mg via NASOGASTRIC

## 2023-12-11 MED ORDER — LOSARTAN 50 MG PO TAB
100 mg | Freq: Every day | GASTROSTOMY | 0 refills | Status: DC
Start: 2023-12-11 — End: 2023-12-18

## 2023-12-11 MED ORDER — HYDROMORPHONE (PF) 2 MG/ML IJ SYRG
.5-1 mg | INTRAVENOUS | 0 refills | Status: DC | PRN
Start: 2023-12-11 — End: 2023-12-11
  Administered 2023-12-11 (×2): 1 mg via INTRAVENOUS

## 2023-12-11 MED ORDER — POTASSIUM CHLORIDE IN WATER 10 MEQ/50 ML IV PGBK
10 meq | INTRAVENOUS | 0 refills | Status: CP
Start: 2023-12-11 — End: ?
  Administered 2023-12-11: 16:00:00 10 meq via INTRAVENOUS

## 2023-12-11 MED ORDER — MAGNESIUM SULFATE IN D5W 1 GRAM/100 ML IV PGBK
1 g | INTRAVENOUS | 0 refills | Status: CP
Start: 2023-12-11 — End: ?
  Administered 2023-12-11: 14:00:00 1 g via INTRAVENOUS

## 2023-12-11 MED ORDER — POTASSIUM CHLORIDE IN WATER 10 MEQ/50 ML IV PGBK
10 meq | INTRAVENOUS | 0 refills | Status: CP
Start: 2023-12-11 — End: ?
  Administered 2023-12-11: 14:00:00 10 meq via INTRAVENOUS

## 2023-12-11 MED ORDER — OXYCODONE 5 MG/5 ML PO SOLN
5-10 mg | ORAL | 0 refills | Status: DC | PRN
Start: 2023-12-11 — End: 2023-12-11
  Administered 2023-12-11 (×2): 10 mg via ORAL

## 2023-12-11 NOTE — Progress Notes
 Interventional Radiology Post-G-tube Follow Up Note      Tube/site assessment: Dressing with small amount of bloody drainage. TF Infusing without difficulty. Patient aox4, nad without complaint. Abdomen soft, non-distended, non-tender. Hgb stable.     Plan:    - G-tube is ok for use as directed at this time.   - No follow up is needed with IR.    General care recs below:    1. Please keep a very strict NPO for the first 12 hours after placement with the tube connected to dependent drainage. After 12 hours the drainage bag can be removed and clear liquid challenges (20-21ml) can begin by mouth and/or through the tube. If tolerating, a clear liquid diet can be ordered and oral medications administered. At 24 hours after placement the tube is OK for normal use.     2. Flush g-tube with 50ml of water after each feeding.    3. Initial dressing can be removed in 48 hours at which time showering may be resumed.    4. T-fasteners (the three buttons below the disc) should fall off on their own in about 10 days. If still present after 10 days, please page the IR resident on call at [phone] to discuss.    5. If NPO and using the tube for draining purposes only, or not using the tube for stretches of time, flush tube twice daily to maintain patency.     6. We recommend cleaning the skin with soap and water only, allowing it to dry completely, and ensuring that the balloon and disc are both taut to the skin so that the tube cannot freely move in and out of the tract. If breakdown is noted, barrier creams should only be applied after skin has been completely cleaned and completely dried. We do not recommend dressings under the disc as this only serves to force the disc further from the abdominal wall, creating a tract for easier leakage to occur.     IR will sign off at this time. Re-consult as needed.    Daryl Reily M Jamilynn Whitacre, APRN-NP  [facility]  Pgr [phone]    Thank you for allowing [place] to participate in the care of this patient. Please call with questions or concerns. If calling after hours or over the weekend please page the IR resident on call @ [phone].

## 2023-12-11 NOTE — Unmapped
 Patient/family declined:  Bed/chair alarm and W/in arms reach during toileting/showering.    Patient/family educated on importance of intervention to their safety/quality of care. Patient/family continues to decline care.    Reason Why Patient/Family declined:  pt report being able to ambulate and will call for help if needed    Individualized safety and/or care plan implemented. If additional safety measures implemented, please list them.     Escalated to:  Unit coordinator/charge nurse and Provider: on call DR Ahmed nawwar  .

## 2023-12-11 NOTE — Progress Notes
 FALL REFUSAL EDUCATION     This RN educated patient on the importance of Fall bundle compliance in the hospital setting. Reviewed potential risks/outcomes with patient if a fall were to happen.     This RN gave education to patient as to why they are considered a fall risk. Patient continuing to refuse fall bundle elements despite multiple attempts of re-education.     Fall Bundle refusal escalated to appropriate team members (NM, UE, MD). Will continue to educate patient during hospital stay on fall bundle compliance.

## 2023-12-11 NOTE — Progress Notes
 Daryl Baker.received radiation therapy treatment today.  29 of 33 treatments.

## 2023-12-11 NOTE — Care Plan
 Problem: Discharge Planning  Goal: Participation in plan of care  Outcome: Goal Ongoing  Goal: Knowledge regarding plan of care  Outcome: Goal Ongoing  Goal: Prepared for discharge  Outcome: Goal Ongoing     Problem: Moderate Fall Risk  Goal: Moderate Fall Risk  Outcome: Goal Ongoing     Problem: Nutrition Deficit  Goal: Adequate nutritional intake  Outcome: Goal Ongoing  Goal: Body weight within specified parameters  Outcome: Goal Ongoing

## 2023-12-11 NOTE — Progress Notes
 General Progress Note    Name:  Daryl Baker     Today's Date:  12/11/2023  Admission Date: 12/08/2023  LOS: 3 days                     Assessment/Plan:    Principal Problem:    Oropharyngeal mass  Active Problems:    Severe malnutrition      68 y.o. 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 He has had increasing pain over the last few days prior to admission near his oropharyngeal mass on the left along with the left aspect of his tongue preventing any PO intake and has a scheduled G-tube placement.    Interval Events:   -G-tube placed 3/12 at 9:06. Tube feeds started at 3/13 at 9:15.   -continuous standard infusion of TwoCal HN; goal rate 55 mL/hr x 24 hrs.   -100mg  thiamine daily x 5-7 days  -On a clear liquid diet   -Radiation treatment 29/33 today  -PO PTA pain regimen converted to fentanyl patch 25 mcg, dilaudid 0.5-1mg  q4h, oxycodone solution 5-10 mg    Dysphagia   Oropharyngeal pain  -Due to left oropharyngeal cancer  -Patient endorses inability to take anything PO including oral meds  -Feels weak and fatigued  -G-tube placed 3/12 AM  Plan  >Magic mouthwash TID PRN for oropharyngeal pain  >PO PTA pain regimen converted to fentanyl patch 25 mcg, dilaudid 0.5-1mg  q4h, oxycodone solution 5-10 mg  >1.5 L IV Lactated Ringers for fluid supplementation  >IV labetalol PRN for SBP >180 as he cannot tolerate PO PTA antihypertensives  > Start tube feeds at 0906 12/11/23   > Consulted Dietician  >continuous standard infusion of TwoCal HN with goal rate 55 mL/hr x 24 hrs.   >Start at 10 mL/hr, advancing rate as tolerated by 10 mL/hr Q8hrs to goal rate  >100mg  thiamine daily x 5-7 days  >Monitor for refeeding syndrome: daily Mg, Phos, K daily     Left HPV-Mediated Oropharyngeal SCC  -Currently on chemo and radiation  -Follow in Oncology clinic with dr Neita Carp and radiation Oncology Dr Mariane Masters  -CT Neck revealed:  1.  Marked decrease size of the prior large left oropharyngeal mass. There   is a residual 1.7 x 1.5 cm lesion involving the left tonsil with central   fluid attenuation. Treated necrotic tumor is the leading consideration. A   post therapeutic retention cyst could appear similar. An abscess is   thought less likely though direct visualization and clinical correlation   is recommended regarding symptoms of infection.   2.  Improvement of bilateral cervical lymphadenopathy.   > Radiation Oncology with continue to perform radiation while inpatient.  >Triamcinolone cream for radiation induced dermatitis    Pancytopenia   Mild Neutropenia  -ANC <1.5  -likely due to Chemo & radiation  -follow with daily CBC     Constipation  -PRN MiraLAX and Senokot     Hypertension  >resume PTA  Losartan via G-tube  >Continue to hold metoprolol      DM type II  -most recent hgba1c from 10/03/23 was 6.8  -PTA on Metformin and Trulicity   >hold above medications  >Accuchecks + low dose correction factor    FEN:  IVF 1.5 L LR  DIET CLEAR LIQUID w/ Body mass index is 31.66 kg/m?Marland Kitchen   PPX:  40mg  qday enoxaparin,  SCDs bilat  Full Code   DISP:  Day 3 of hospital stay.   Barriers to discharge: confirm tolerance with G-tube feeds.  Follow up appt w/ PCP: Felecia Jan [phone] and subspecialists.    ________________________________________________________________________    Subjective  Daryl Baker is a 68 y.o. male.  Patient had G-tube placed yesterday & is starting tube feeds today. He continues to report oral pain when he speaks, which seems to wear off before the next dose. He also reports dermatitis of his left cheek for which he normally uses a salve.        Medications  Scheduled Meds:enoxaparin (LOVENOX) syringe 40 mg, 40 mg, Subcutaneous, QDAY(21)  fentaNYL (DURAGESIC) 25 mcg/hr patch 1 patch, 1 patch, Transdermal, Q72H*   And  Verification of Patch Placement and Integrity - fentaNYL 25 mcg/hr, , Transdermal, BID  insulin aspart (U-100) (NOVOLOG FLEXPEN U-100 INSULIN) injection PEN 0-6 Units, 0-6 Units, Subcutaneous, ACHS (22)  [Held by Provider] losartan (COZAAR) tablet 100 mg, 100 mg, Oral, QDAY  [Held by Provider] metoprolol succinate XL (TOPROL XL) tablet 100 mg, 100 mg, Oral, QDAY  thiamine hcl (vitamin B1) injection 100 mg, 100 mg, Intravenous, QDAY    Continuous Infusions:   Diet Enteral Feeding Standard Infusion       PRN and Respiratory Meds:dextrose 50% (D50) IV PRN, diphenhydrAMINE/lidocaine/mag,al-simeth(#) TID PRN, HYDROmorphone (DILAUDID) injection Q4H PRN, labetalol (NORMODYNE; TRANDATE) injection Q6H PRN, melatonin QHS PRN, ondansetron Q6H PRN **OR** ondansetron (ZOFRAN) IV Q6H PRN, oxyCODONE Q4H PRN, pancrelipase 20,880 Units/sodium bicarbonate 650 mg (Cornell CLOG DESTROYER) PRN (On Call from Rx), polyethylene glycol 3350 QDAY PRN, sennosides-docusate sodium QDAY PRN, sodium chloride PRN        Objective:                          Vital Signs: Last Filed                 Vital Signs: 24 Hour Range   BP: 157/75 (03/13 0517)  Temp: 36.7 ?C (98.1 ?F) (03/13 0450)  Pulse: 88 (03/13 0450)  Respirations: 16 PER MINUTE (03/13 0450)  SpO2: 98 % (03/13 0450)  O2 Device: (P) None (Room air) (03/13 0450)  O2 Liter Flow: 4 Lpm (03/12 0900) BP: (123-161)/(65-94)   Temp:  [36.4 ?C (97.5 ?F)-36.7 ?C (98.1 ?F)]   Pulse:  [79-90]   Respirations:  [9 PER MINUTE-17 PER MINUTE]   SpO2:  [92 %-100 %]   O2 Device: (P) None (Room air)  O2 Liter Flow: 4 Lpm   Intensity Pain Scale (Self Report): 9 (12/11/23 0447) Vitals:    12/08/23 0907 12/08/23 1445 12/09/23 1715   Weight: 102 kg (224 lb 13.9 oz) 101.5 kg (223 lb 12.8 oz) 101.5 kg (223 lb 12.8 oz)       Intake/Output Summary:  (Last 24 hours)    Intake/Output Summary (Last 24 hours) at 12/11/2023 0729  Last data filed at 12/11/2023 0500  Gross per 24 hour   Intake 50 ml   Output 1180 ml   Net -1130 ml           Physical Exam General: well developed in No distress w/ Body mass index is 31.66 kg/m?Marland Kitchen   Skin: no rash or wounds, normal capillary refill  HEENT: erythematous lesion on left side of tongue consistent with mucositis  Neck: supple  Heart: S1 S2, no murmur  Lungs: clear bilat  Abd: soft, NT, ND, +bs, no hepatosplenomegaly  Musculoskeletal: full ROM  Vascular: radial and pedal pulses palpable, no edema  Neuro: alert and oriented, MAE, speech intact       Point of Care Testing  (Last 24 hours)  Glucose: 92 (12/11/23 0443)  POC Glucose (Download): 96 (12/11/23 0033)    Radiology and other Diagnostics Review:    CT NECK W/CONTRAST  Result Date: 12/08/2023  1.  Marked decrease size of the prior large left oropharyngeal mass. There is a residual 1.7 x 1.5 cm lesion involving the left tonsil with central fluid attenuation. Treated necrotic tumor is the leading consideration. A post therapeutic retention cyst could appear similar. An abscess is thought less likely though direct visualization and clinical correlation is recommended regarding symptoms of infection. 2.  Improvement of bilateral cervical lymphadenopathy. By my electronic signature, I attest that I have personally reviewed the images for this examination and formulated the interpretations and opinions expressed in this report  Finalized by Marily Memos, M.D. on 12/08/2023 12:00 PM. Dictated by Jerene Pitch, MD on 12/08/2023 11:07 AM.    CT HEAD WO CONTRAST  Result Date: 12/08/2023  1.  No acute intracranial hemorrhage or mass effect. 2.  Mild patchy cerebral white matter hypodensities, likely sequelae of chronic microvascular ischemia. 3.  The CT neck is dictated separately. Please see that report. By my electronic signature, I attest that I have personally reviewed the images for this examination and formulated the interpretations and opinions expressed in this report  Finalized by Marily Memos, M.D. on 12/08/2023 12:00 PM. Dictated by Jerene Pitch, MD on 12/08/2023 10:51 AM.       Justine Null, MD   Internal Medicine-Psychiatry Resident, PGY1  Available on [employer]     Seen & discussed with Dr. Jeannette Corpus

## 2023-12-12 ENCOUNTER — Encounter: Admit: 2023-12-12 | Discharge: 2023-12-12 | Payer: MEDICARE

## 2023-12-12 ENCOUNTER — Ambulatory Visit: Admit: 2023-12-12 | Discharge: 2023-12-12 | Payer: MEDICARE

## 2023-12-12 LAB — CBC AND DIFF
~~LOC~~ BKR ABSOLUTE BASO COUNT: 0 10*3/uL — ABNORMAL LOW (ref 0.00–0.20)
~~LOC~~ BKR ABSOLUTE EOS COUNT: 0 10*3/uL — ABNORMAL HIGH (ref 0.00–0.45)
~~LOC~~ BKR ABSOLUTE LYMPH COUNT: 0.1 10*3/uL — ABNORMAL LOW (ref 1.00–4.80)
~~LOC~~ BKR ABSOLUTE MONO COUNT: 0.5 10*3/uL — ABNORMAL HIGH (ref 0.00–0.80)
~~LOC~~ BKR ABSOLUTE NEUTROPHIL: 1.2 10*3/uL — ABNORMAL LOW (ref 1.80–7.00)
~~LOC~~ BKR BASOPHILS %: 0.1 % — ABNORMAL HIGH (ref 0.0–2.0)
~~LOC~~ BKR EOSINOPHILS %: 0.1 % — ABNORMAL LOW (ref 0.0–5.0)
~~LOC~~ BKR MCH: 33 pg — ABNORMAL LOW (ref 26.0–34.0)
~~LOC~~ BKR MCHC: 35 g/dL — ABNORMAL LOW (ref 32.0–36.0)
~~LOC~~ BKR RDW: 16 % — ABNORMAL HIGH (ref 11.0–15.0)
~~LOC~~ BKR WBC COUNT: 1.8 10*3/uL — ABNORMAL LOW (ref 4.50–11.00)

## 2023-12-12 LAB — POC GLUCOSE
~~LOC~~ BKR POC GLUCOSE: 137 mg/dL — ABNORMAL HIGH (ref 70–100)
~~LOC~~ BKR POC GLUCOSE: 188 mg/dL — ABNORMAL HIGH (ref 70–100)
~~LOC~~ BKR POC GLUCOSE: 191 mg/dL — ABNORMAL HIGH (ref 70–100)
~~LOC~~ BKR POC GLUCOSE: 210 mg/dL — ABNORMAL HIGH (ref 70–100)

## 2023-12-12 LAB — COMPREHENSIVE METABOLIC PANEL
~~LOC~~ BKR ALT: 9 U/L — ABNORMAL HIGH (ref 7–56)
~~LOC~~ BKR ANION GAP: 10 % — ABNORMAL LOW (ref 3–12)
~~LOC~~ BKR AST: 10 U/L — ABNORMAL HIGH (ref 7–40)
~~LOC~~ BKR BLD UREA NITROGEN: 9 mg/dL — ABNORMAL LOW (ref 7–25)
~~LOC~~ BKR CALCIUM: 7.9 mg/dL — ABNORMAL LOW (ref 8.5–10.6)
~~LOC~~ BKR CHLORIDE: 102 mmol/L — ABNORMAL HIGH (ref 98–110)
~~LOC~~ BKR CO2: 22 mmol/L — ABNORMAL HIGH (ref 21–30)
~~LOC~~ BKR GLOMERULAR FILTRATION RATE (GFR): >60 mL/min — ABNORMAL HIGH (ref >60–12.0)
~~LOC~~ BKR POTASSIUM: 4 mmol/L — ABNORMAL HIGH (ref 3.5–5.1)
~~LOC~~ BKR SODIUM, SERUM: 134 mmol/L — ABNORMAL LOW (ref 137–147)

## 2023-12-12 LAB — MAGNESIUM: ~~LOC~~ BKR MAGNESIUM: 2 mg/dL — ABNORMAL LOW (ref 1.6–2.6)

## 2023-12-12 LAB — PHOSPHORUS: ~~LOC~~ BKR PHOSPHORUS: 3.5 mg/dL — ABNORMAL LOW (ref 2.0–4.5)

## 2023-12-12 MED ORDER — PATCH DOCUMENTATION - FENTANYL 12 MCG/HR
Freq: Two times a day (BID) | TRANSDERMAL | 0 refills | Status: DC
Start: 2023-12-12 — End: 2023-12-16

## 2023-12-12 MED ORDER — FENTANYL 25 MCG/HR TD PT72
1 | MEDICATED_PATCH | TRANSDERMAL | 0 refills | Status: DC
Start: 2023-12-12 — End: 2024-01-01
  Filled 2023-12-15: qty 10, 30d supply, fill #1

## 2023-12-12 MED ORDER — OXYCODONE 5 MG/5 ML PO SOLN
5-10 mg | NASOGASTRIC | 0 refills | Status: DC | PRN
Start: 2023-12-12 — End: 2023-12-14
  Administered 2023-12-12 – 2023-12-14 (×11): 10 mg via NASOGASTRIC

## 2023-12-12 MED ORDER — POLYETHYLENE GLYCOL 3350 17 GRAM PO PWPK
1 | Freq: Every day | GASTROSTOMY | 0 refills | Status: DC
Start: 2023-12-12 — End: 2023-12-13

## 2023-12-12 MED ORDER — POLYETHYLENE GLYCOL 3350 17 GRAM PO PWPK
1 | Freq: Two times a day (BID) | GASTROSTOMY | 0 refills | Status: DC
Start: 2023-12-12 — End: 2023-12-19
  Administered 2023-12-13 – 2023-12-18 (×10): 17 g via GASTROSTOMY

## 2023-12-12 MED ORDER — SENNOSIDES-DOCUSATE SODIUM 8.6-50 MG PO TAB
1 | Freq: Every day | GASTROSTOMY | 0 refills | Status: DC
Start: 2023-12-12 — End: 2023-12-13

## 2023-12-12 MED ORDER — BISACODYL 10 MG RE SUPP
10 mg | Freq: Every day | RECTAL | 0 refills | Status: DC | PRN
Start: 2023-12-12 — End: 2024-01-02

## 2023-12-12 MED ORDER — FENTANYL 12 MCG/HR TD PT72
1 | MEDICATED_PATCH | TRANSDERMAL | 0 refills | Status: DC
Start: 2023-12-12 — End: 2024-01-01

## 2023-12-12 MED ORDER — FENTANYL 12 MCG/HR TD PT72
1 | TRANSDERMAL | 0 refills | Status: DC
Start: 2023-12-12 — End: 2023-12-16
  Administered 2023-12-13 – 2023-12-15 (×2): 1 via TRANSDERMAL

## 2023-12-12 MED ORDER — SENNOSIDES-DOCUSATE SODIUM 8.6-50 MG PO TAB
1 | Freq: Two times a day (BID) | GASTROSTOMY | 0 refills | Status: DC
Start: 2023-12-12 — End: 2023-12-16
  Administered 2023-12-13 – 2023-12-16 (×7): 1 via GASTROSTOMY

## 2023-12-12 MED ORDER — GABAPENTIN 300 MG PO CAP
300 mg | Freq: Three times a day (TID) | GASTROSTOMY | 0 refills | Status: DC
Start: 2023-12-12 — End: 2024-01-02
  Administered 2023-12-12 – 2024-01-02 (×61): 300 mg via GASTROSTOMY

## 2023-12-12 NOTE — Progress Notes
 CLINICAL NUTRITION                                                        Clinical Nutrition Follow-Up Assessment     Name: Daryl Baker   MRN: [id number]     DOB: [birth year]      Age: 68 y.o.  Admission Date: 12/08/2023     LOS: 4 days     Date of Service: 12/12/2023        Recommendation:  Least restrictive PO diet per SLP/primary team.   Continue to titrate continuous standard infusion of TwoCal HN up to goal rate 55 mL/hr x 24 hrs.   Continue to advance rate as tolerated by 10 mL/hr Q8hrs to goal rate.  Goal rate provides 2640 kcal/day, 110 g protein/day, and 924 mL free water/day.   Provide at least 30 mL water flush Q4hrs for tube patency. Additional fluids per primary team.   Pt is at risk for refeeding syndrome. Start 100mg  thiamine daily x 5-7 days. Monitor Mg, Phos, K+ closely and replace PRN with goal to keep K+ >4, Phos >3 and Mag >2.         When labs stabilized and able to consistently tolerate continuous EN at goal rate 55 mL/hr, may transition to bolus feeds if deemed appropriate by primary team.   When/if appropriate to transition to bolus feeds, recommend goal of 5.5 cartons of TwoCal HN.   Rec TwoCal HN goal of 1 carton (237 mL) x 4 feeds per day + 1.5 cartons (~356 mL) once daily for a total of 5.5 cartons per day.Provide 1/2 carton (~118 mL) at first two feedings. If pt is able to tolerate first 2 feedings, provide 1 carton (237 mL) at third feeding. Can provide up to 1.5 cartons (~356 mL) per feeding depending on pt's tolerance.   Goal provides 2613 kcal/day, 109 g protein/day, and 913 mL free water/day.    Provide at least 30 ml free water before and after each bolus feed. Additional fluids per primary team.  Consider 50 mL water flush before and after each bolus feed. Consider providing additional 245 mL water bolus QID for hydration needs.   Monitor wt, intake, intake tolerance, GI function, S/S of refeeding syndrome, and lab data.  Recommend ongoing follow-up with outpatient dietitian for tube feeding management.    Comments:  68 y.o. 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 intake admitted for further management. Patient endorses decreased p.o. intake secondary to left oropharyngeal mass. Labs and meds reviewed. Now s/p PEG tube placement 12/10/23. EN with TwoCal HN started yesterday. Running at 30 mL/hr at time of follow-up today, working on titrating up to goal rate. Tolerating EN thus far per RN. Current interventions appropriate. Will continue to monitor.                                Reginal Lutes, MS, RDN, LD  Clinical Dietitian - Department of Clinical Nutrition  Available on Voalte

## 2023-12-12 NOTE — Unmapped
 Patient/family declined:  Bed/chair alarm and W/in arms reach during toileting/showering.    Patient/family educated on importance of intervention to their safety/quality of care. Patient/family continues to decline care.    Reason Why Patient/Family declined:  Feels comfortable ambulating on his own.    Individualized safety and/or care plan implemented. If additional safety measures implemented, please list them.     Escalated to:  Unit coordinator/charge nurse.

## 2023-12-12 NOTE — Progress Notes
 General Progress Note    Name:  Daryl Baker     Today's Date:  12/12/2023  Admission Date: 12/08/2023  LOS: 4 days                     Assessment/Plan:    Principal Problem:    Oropharyngeal mass  Active Problems:    Severe malnutrition      68 y.o. 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 He has had increasing pain over the last few days prior to admission near his oropharyngeal mass on the left along with the left aspect of his tongue preventing any PO intake and has a scheduled G-tube placement.    Interval Events:   -Palliative Care consult:   >Add additional 12 mcg fentanyl patch today for total 37.5 mcg fentanyl patch  >Increase oxycodone frequency; oxycodone 5 to 10 mg every 3 hours as needed  >Add gabapentin 300 mg 3 times daily  >Recommend aggressive bowel regimen given no bowel movement in the past 4 days  -He will need the G-tube EN for >90 days.    Dysphagia   Oropharyngeal pain  -Due to left oropharyngeal cancer  -Patient endorses inability to take anything PO including oral meds  -Feels weak and fatigued  -G-tube placed 3/12 AM  -He will need the G-tube feeds for >90 days.  Plan  >Magic mouthwash TID PRN for oropharyngeal pain  >PO PTA pain regimen converted to fentanyl patch 25 mcg, dilaudid 0.5-1mg  q4h, oxycodone solution 5-10 mg  >1.5 L IV Lactated Ringers for fluid supplementation  >IV labetalol PRN for SBP >180 as he cannot tolerate PO PTA antihypertensives  > Start tube feeds at 0906 12/11/23   > Consulted Dietician  >continuous standard infusion of TwoCal HN with goal rate 55 mL/hr x 24 hrs.   >Start at 10 mL/hr, advancing rate as tolerated by 10 mL/hr Q8hrs to goal rate  >100mg  thiamine daily x 5-7 days  >Monitor for refeeding syndrome: daily Mg, Phos, K daily  > Palliative Care consult:   >Add additional 12 mcg fentanyl patch today for total 37.5 mcg fentanyl patch  >Increase oxycodone frequency; oxycodone 5 to 10 mg every 3 hours as needed  >Add gabapentin 300 mg 3 times daily  >Increased senna & PEG to qday scheduled via Gtube     Left HPV-Mediated Oropharyngeal SCC  -Currently on chemo and radiation  -Follow in Oncology clinic with dr Neita Carp and radiation Oncology Dr Mariane Masters  -CT Neck revealed:  1.  Marked decrease size of the prior large left oropharyngeal mass. There   is a residual 1.7 x 1.5 cm lesion involving the left tonsil with central   fluid attenuation. Treated necrotic tumor is the leading consideration. A   post therapeutic retention cyst could appear similar. An abscess is   thought less likely though direct visualization and clinical correlation   is recommended regarding symptoms of infection.   2.  Improvement of bilateral cervical lymphadenopathy.   > Radiation Oncology with continue to perform radiation while inpatient.  >Triamcinolone cream for radiation induced dermatitis    Pancytopenia   Mild Neutropenia  -ANC <1.5  -likely due to Chemo & radiation  -follow with daily CBC     Constipation  -PRN MiraLAX and Senokot     Hypertension  >resume PTA  Losartan via G-tube  >Continue to hold metoprolol  DM type II  -most recent hgba1c from 10/03/23 was 6.8  -PTA on Metformin and Trulicity   >hold above medications  >Accuchecks + low dose correction factor    FEN:  IVF 1.5 L LR  DIET CLEAR LIQUID w/ Body mass index is 31.66 kg/m?Marland Kitchen   PPX:  40mg  qday enoxaparin, SCDs bilat  Full Code   DISP:  Day 4 of hospital stay.   Barriers to discharge: confirm tolerance with G-tube feeds.  Follow up appt w/ PCP: Felecia Jan [phone] and subspecialists.    ________________________________________________________________________    Subjective  Daryl Baker is a 68 y.o. male.  Patient reports no discomfort associated with the Gtube. Denies N/V. Reports constipation. He continues to have significant oral pain with speaking.       Medications  Scheduled Meds:enoxaparin (LOVENOX) syringe 40 mg, 40 mg, Subcutaneous, QDAY(21)  fentaNYL (DURAGESIC) 25 mcg/hr patch 1 patch, 1 patch, Transdermal, Q72H*   And  Verification of Patch Placement and Integrity - fentaNYL 25 mcg/hr, , Transdermal, BID  insulin aspart (U-100) (NOVOLOG FLEXPEN U-100 INSULIN) injection PEN 0-6 Units, 0-6 Units, Subcutaneous, ACHS (22)  [Held by Provider] losartan (COZAAR) tablet 100 mg, 100 mg, Per G Tube, QDAY  [Held by Provider] metoprolol succinate XL (TOPROL XL) tablet 100 mg, 100 mg, Oral, QDAY  thiamine hcl (vitamin B1) injection 100 mg, 100 mg, Intravenous, QDAY  triamcinolone acetonide (KENALOG) 0.1 % topical cream 1 g, 1 g, Topical, BID    Continuous Infusions:   Diet Enteral Feeding Standard Infusion 30 mL/hr at 12/12/23 0432     PRN and Respiratory Meds:dextrose 50% (D50) IV PRN, diphenhydrAMINE/lidocaine/mag,al-simeth(#) TID PRN, HYDROmorphone (DILAUDID) injection Q4H PRN, labetalol (NORMODYNE; TRANDATE) injection Q6H PRN, melatonin QHS PRN, ondansetron Q6H PRN **OR** ondansetron (ZOFRAN) IV Q6H PRN, oxyCODONE Q4H PRN, pancrelipase 20,880 Units/sodium bicarbonate 650 mg ([place] CLOG DESTROYER) PRN (On Call from Rx), polyethylene glycol 3350 QDAY PRN, sennosides-docusate sodium QDAY PRN, sodium chloride PRN        Objective:                          Vital Signs: Last Filed                 Vital Signs: 24 Hour Range   BP: 146/64 (03/14 0331)  Temp: 36.6 ?C (97.9 ?F) (03/14 4782)  Pulse: 102 (03/14 0331)  Respirations: 16 PER MINUTE (03/14 0331)  SpO2: 98 % (03/14 0331)  O2 Device: None (Room air) (03/14 0331) BP: (132-146)/(64-78)   Temp:  [36.6 ?C (97.9 ?F)-37.4 ?C (99.4 ?F)]   Pulse:  [87-102]   Respirations:  [16 PER MINUTE]   SpO2:  [98 %-99 %]   O2 Device: None (Room air)   Intensity Pain Scale (Self Report): 10 (12/12/23 0429) Vitals:    12/08/23 0907 12/08/23 1445 12/09/23 1715   Weight: 102 kg (224 lb 13.9 oz) 101.5 kg (223 lb 12.8 oz) 101.5 kg (223 lb 12.8 oz)       Intake/Output Summary:  (Last 24 hours)    Intake/Output Summary (Last 24 hours) at 12/12/2023 0724  Last data filed at 12/12/2023 0434  Gross per 24 hour   Intake 993 ml   Output 1640 ml   Net -647 ml           Physical Exam   General: well developed in No distress w/ Body mass index is 31.66 kg/m?Marland Kitchen   Skin: no rash or wounds, normal capillary  refill  HEENT: erythematous lesion on left side of tongue consistent with mucositis  Neck: supple  Heart: S1 S2, no murmur  Lungs: clear bilat  Abd: soft, NT, ND, +bs, no hepatosplenomegaly  Musculoskeletal: full ROM  Vascular: radial and pedal pulses palpable, no edema  Neuro: alert and oriented, MAE, speech intact       Point of Care Testing  (Last 24 hours)  Glucose: (!) 163 (12/12/23 0142)  POC Glucose (Download): (!) 137 (12/11/23 2116)    Radiology and other Diagnostics Review:    CT NECK W/CONTRAST  Result Date: 12/08/2023  1.  Marked decrease size of the prior large left oropharyngeal mass. There is a residual 1.7 x 1.5 cm lesion involving the left tonsil with central fluid attenuation. Treated necrotic tumor is the leading consideration. A post therapeutic retention cyst could appear similar. An abscess is thought less likely though direct visualization and clinical correlation is recommended regarding symptoms of infection. 2.  Improvement of bilateral cervical lymphadenopathy. By my electronic signature, I attest that I have personally reviewed the images for this examination and formulated the interpretations and opinions expressed in this report  Finalized by Marily Memos, M.D. on 12/08/2023 12:00 PM. Dictated by Jerene Pitch, MD on 12/08/2023 11:07 AM.    CT HEAD WO CONTRAST  Result Date: 12/08/2023  1.  No acute intracranial hemorrhage or mass effect. 2.  Mild patchy cerebral white matter hypodensities, likely sequelae of chronic microvascular ischemia. 3.  The CT neck is dictated separately. Please see that report. By my electronic signature, I attest that I have personally reviewed the images for this examination and formulated the interpretations and opinions expressed in this report  Finalized by Marily Memos, M.D. on 12/08/2023 12:00 PM. Dictated by Jerene Pitch, MD on 12/08/2023 10:51 AM.       Justine Null, MD   Internal Medicine-Psychiatry Resident, PGY1  Available on Voalte     Seen & discussed with Dr. Jeannette Corpus

## 2023-12-12 NOTE — Progress Notes
 Patient/family declined:  Bed/chair alarm.    Patient/family educated on importance of intervention to their safety/quality of care. Patient/family continues to decline care.    Reason Why Patient/Family declined:  Stated he does everything for himself.    Individualized safety and/or care plan implemented. If additional safety measures implemented, please list them.     Escalated to:  Unit coordinator/charge nurse.

## 2023-12-12 NOTE — Progress Notes
 END OF SHIFT/PLAN OF CARE NURSING NOTE    Admission Date: 12/08/2023  Length of Stay: LOS: 4 days    Acute events, pain management, and nursing interventions: YES      Communication with providers: YES     Intake and Output:        Intake/Output Summary (Last 24 hours) at 12/12/2023 1416  Last data filed at 12/12/2023 1100  Gross per 24 hour   Intake 993 ml   Output 1440 ml   Net -447 ml        Last Bowel Movement Date:  (PTA)    Fall Risk/JHFRAT Interventions and Education:   Elimination: YES  Medications: YES  Bed/chair alarm: YES  Patient Care Equipment: YES  Mobility: YES  Cognition: YES    Patient Education  Patient-specific education provided related to quality/safety:  Fall risk, Pain scale  Patient-specific education provided (other): yes  Learners: Patient, family  Method/materials used: Therapist, art  Response to learning: TEFL teacher Understanding  Needs reinforcement on: no    Patient Goal(s):  Patient will Verbalize pain has improved by the end of shift         Patient will  Verbalize readiness for discharge by discharge date   Other:    Restraints:  No  Restraint Goal: Patient will be free from injury while physically restrained.  See Docflowsheet for restraint documentation, interventions, education, etc.

## 2023-12-12 NOTE — Progress Notes
 Nani Ravens.received radiation therapy treatment today.  30 of 33 treatments.

## 2023-12-12 NOTE — Case Management (ED)
 Case Management Progress Note    NAME:Daryl Baker                          MRN: [id number]              DOB:02-10-1956          AGE: 68 y.o.  ADMISSION DATE: 12/08/2023             DAYS ADMITTED: LOS: 4 days      Today's Date: 12/12/2023    PLAN: Anticipate DC home with EN thru [facility] pending insur auth.     Expected Discharge Date: 12/14/2023   Is Patient Medically Stable: No, Please explain: titrating TF  Are there Barriers to Discharge? no    INTERVENTION/DISPOSITION:  Discharge Planning              Discharge Planning: Home Infusion-Enteral-TPN  NCM reviewed EMR and participated in med onc huddle  NCM confirmed with Janeane @ [employer] that EN ref has been received and is currently being reviewed.    Insurance has not made a determination.  Shayne Alken will call this NCM  w/ an update once available.   NCM will cont to folllow  Transportation              Does the Patient Need Case Management to Arrange Discharge Transport? (ex: facility, ambulance, wheelchair/stretcher, Medicaid, cab, other): No  Transportation Name, Phone and Availability #1: buckley, bradly (Spouse)  [phone] (Mobile)  Support              Support: Huddle/team update  Info or Referral                 Positive SDOH Domains and Potential Barriers                   Medication Needs                                                                                                                                                         Financial                 Legal                 Other                 Discharge Disposition  Selected Continued Care - Admitted Since 12/08/2023    No services have been selected for the patient.           Humphrey Rolls, RN    Humphrey Rolls, RN BSN  Integrated Nurse Case Production designer, theatre/television/film  Available on [place]  [phone]

## 2023-12-13 LAB — CBC AND DIFF
~~LOC~~ BKR ABSOLUTE BASO COUNT: 0 10*3/uL — ABNORMAL LOW (ref 0.00–0.20)
~~LOC~~ BKR ABSOLUTE EOS COUNT: 0 10*3/uL — ABNORMAL HIGH (ref 0.00–0.45)
~~LOC~~ BKR ABSOLUTE MONO COUNT: 0.4 10*3/uL — ABNORMAL LOW (ref 0.00–0.80)
~~LOC~~ BKR ABSOLUTE NEUTROPHIL: 1.1 10*3/uL — ABNORMAL LOW (ref 1.80–7.00)
~~LOC~~ BKR BASOPHILS %: 0.1 % — ABNORMAL HIGH (ref 0.0–2.0)
~~LOC~~ BKR EOSINOPHILS %: 0.2 % — ABNORMAL LOW (ref 0.0–5.0)
~~LOC~~ BKR LYMPHOCYTES %: 4.2 % — ABNORMAL LOW (ref 24.0–44.0)
~~LOC~~ BKR MCH: 33 pg — ABNORMAL LOW (ref 26.0–34.0)
~~LOC~~ BKR MCHC: 35 g/dL — ABNORMAL LOW (ref 32.0–36.0)
~~LOC~~ BKR MCV: 93 fL — ABNORMAL HIGH (ref 80.0–100.0)
~~LOC~~ BKR MONOCYTES %: 27 % — ABNORMAL HIGH (ref 4.0–12.0)
~~LOC~~ BKR MPV: 7.5 fL (ref 7.0–11.0)
~~LOC~~ BKR PLATELET COUNT: 191 10*3/uL — ABNORMAL LOW (ref 150–400)
~~LOC~~ BKR RBC COUNT: 2.7 10*6/uL — ABNORMAL LOW (ref 4.40–5.50)
~~LOC~~ BKR RDW: 16 % — ABNORMAL HIGH (ref 11.0–15.0)

## 2023-12-13 LAB — POC GLUCOSE
~~LOC~~ BKR POC GLUCOSE: 210 mg/dL — ABNORMAL HIGH (ref 70–100)
~~LOC~~ BKR POC GLUCOSE: 250 mg/dL — ABNORMAL HIGH (ref 70–100)
~~LOC~~ BKR POC GLUCOSE: 262 mg/dL — ABNORMAL HIGH (ref 70–100)
~~LOC~~ BKR POC GLUCOSE: 288 mg/dL — ABNORMAL HIGH (ref 70–100)
~~LOC~~ BKR POC GLUCOSE: 333 mg/dL — ABNORMAL HIGH (ref 70–100)

## 2023-12-13 LAB — COMPREHENSIVE METABOLIC PANEL
~~LOC~~ BKR ALBUMIN: 2.9 g/dL — ABNORMAL LOW (ref 3.5–5.0)
~~LOC~~ BKR CHLORIDE: 101 mmol/L — ABNORMAL LOW (ref 98–110)
~~LOC~~ BKR GLOMERULAR FILTRATION RATE (GFR): >60 mL/min — ABNORMAL LOW (ref >60–4.80)
~~LOC~~ BKR POTASSIUM: 4.1 mmol/L — ABNORMAL LOW (ref 3.5–5.1)

## 2023-12-13 MED ORDER — WATER BOLUS (ENAR)
100 mL | Freq: Every day | GASTROSTOMY | 0 refills | Status: DC
Start: 2023-12-13 — End: 2023-12-25

## 2023-12-13 MED ORDER — TUBE FEED BOLUS (ENAR)
Freq: Every day | 0 refills | Status: DC
Start: 2023-12-13 — End: 2023-12-25

## 2023-12-13 MED ORDER — PANCRELIPASE-SODIUM BICARBONATE 20,880 K UNIT-650 MG
GASTROSTOMY | 0 refills | Status: CN | PRN
Start: 2023-12-13 — End: ?

## 2023-12-13 NOTE — Progress Notes
 PALLIATIVE CARE INPATIENT NOTE     Name: Daryl Baker            MRN: [id number]                DOB: [birth year]          Age: 68 y.o.  Admission Date: 12/08/2023             LOS: 5 days    ASSESSMENT/PLAN   Jameison Haji is a 68 y.o. male with with a past medical history of hypertension, diabetes type 2, left-sided HPV mediated oropharyngeal cancer undergoing radiation therapy who presented to [facility] on 12/08/2023 for dysphagia, oropharyngeal pain and inability to tolerate p.o. intake. The palliative care team was consulted for assistance with symptom management for radiation induced mucositis.    # Pain, secondary to malignancy  # Oropharyngeal carcinoma, left, HPV mediated  # Dysphagia  # Poor p.o. intake    Discussion:  Met with patient in his room today. We discussed the plan made yesterday to increase his fentanyl patch dose to see how that would help manage his pain more effectively.  He reports the increase dose had not been started yet.   Discussed with RN caring for patient today who will ensure that the increased dose is initiated.  Also asked her to apply a new patch when putting on the additional patch as the one currently on was not properly adhered to his skin.  Requested she also apply clear occlusive dressing over the patches and to place them in an area that was not covered with as much hair if possible.          RECOMMENDATIONS:   Change fentanyl patch dose to .   Continue oxycodone 5 to 10 mg every 3 hours as needed - required 5 doses (50mg ) in the past 24 hours.   Continue gabapentin 300 mg 3 times daily  Continue aggressive bowel regimen and patient encouraged to increase fluid intake and ambulate in halls to promote BM as well.      High medical decision making due to the following (2 of 3 elements):  1 or more chronic illness with side effects due to treatment  Drug therapy requiring intensive monitoring for toxicity    Radene Knee APRN, [facility]  Palliative Care CNS  Preferred communication via [place]  Office:  [phone]  Nights/Weekends - Page [phone] for Palliative Care On-Call    Note created with voice dictation software, please excuse grammatical and typographical errors.         PALLIATIVE CARE PLANNING     Advance Care Planning:   Identified Health Care Decision Maker:    DPOA - Would benefit from completion of DPOA  TPOPP - Not discussed    PC Clinic - NA    Medication safety - Advise a Narcan prescription on discharge    Disposition planning: Pending       SUBJECTIVE     CC/Reason for Visit: Symptoms (pain)    Interval HPI: Patient is sitting up on edge of bed, reports he continues to have pain, primarily in his mouth which makes it difficult to talk.  Rates the pain as 9/10 presently.  Says after the IV dilaudid it goes to about a 4/10 but only lasts about 30 minutes.  After the oxycodone it sometimes gets to a 2-3/10 and lasts for multiple hours though it takes a while to start working.      Still has not  had a BM, abdomen is soft and he says he does not feel constipated.      ROS: Review of Systems   Constitutional:  Positive for malaise/fatigue.   HENT:          Mouth and tongue pain   Gastrointestinal:  Positive for constipation.       OBJECTIVE     Blood pressure 96/54, pulse 108, temperature 37.1 ?C (98.7 ?F), height 179.1 cm (5' 10.5), weight 101.5 kg (223 lb 12.8 oz), SpO2 97%.  Physical Exam  Constitutional:       Comments: Chronically ill appearing male   HENT:      Head: Normocephalic.      Mouth/Throat:      Mouth: Mucous membranes are dry.   Eyes:      General: No scleral icterus.     Extraocular Movements: Extraocular movements intact.   Cardiovascular:      Rate and Rhythm: Normal rate and regular rhythm.      Pulses: Normal pulses.   Pulmonary:      Effort: Pulmonary effort is normal. No respiratory distress.   Abdominal:      General: There is distension.      Palpations: Abdomen is soft.      Tenderness: There is no abdominal tenderness. Musculoskeletal:      Right lower leg: No edema.      Left lower leg: No edema.   Skin:     General: Skin is warm and dry.   Neurological:      Mental Status: He is alert and oriented to person, place, and time.   Psychiatric:         Mood and Affect: Mood normal.         Behavior: Behavior normal.         Lab Results:  CBC   Lab Results   Component Value Date/Time    WBC 1.60 (L) 12/13/2023 04:27 AM    HGB 9.2 (L) 12/13/2023 04:27 AM    PLTCT 191 12/13/2023 04:27 AM     Lab Results   Component Value Date/Time    NEUT 68.0 12/13/2023 04:27 AM    ANC 1.10 (L) 12/13/2023 04:27 AM      Chemistries   Lab Results   Component Value Date/Time    NA 136 (L) 12/13/2023 04:27 AM    K 4.1 12/13/2023 04:27 AM    BUN 11 12/13/2023 04:27 AM    CR 0.49 12/13/2023 04:27 AM    GLU 242 (H) 12/13/2023 04:27 AM     Lab Results   Component Value Date/Time    CA 8.1 (L) 12/13/2023 04:27 AM    PO4 3.8 12/13/2023 04:27 AM    ALBUMIN 2.9 (L) 12/13/2023 04:27 AM    TOTPROT 5.5 (L) 12/13/2023 04:27 AM    ALKPHOS 61 12/13/2023 04:27 AM    AST 11 12/13/2023 04:27 AM    ALT 8 12/13/2023 04:27 AM    TOTBILI 0.6 12/13/2023 04:27 AM    GFR >60 12/13/2023 04:27 AM        Other Pertinent Diagnostic Results:   12/13/23 EMR reviewed.

## 2023-12-13 NOTE — Unmapped
 Patient/family declined:  Bed/chair alarm and W/in arms reach during toileting/showering.    Patient/family educated on importance of intervention to their safety/quality of care. Patient/family continues to decline care.    Reason Why Patient/Family declined:  pt wants to be independent and wife is intermittently in the room.    Individualized safety and/or care plan implemented. If additional safety measures implemented, please list them.     Escalated to:  Unit coordinator/charge nurse.

## 2023-12-13 NOTE — Care Plan
 Problem: Discharge Planning  Goal: Participation in plan of care  Outcome: Goal Ongoing  Goal: Knowledge regarding plan of care  Outcome: Goal Ongoing  Goal: Prepared for discharge  Outcome: Goal Ongoing     Problem: Moderate Fall Risk  Goal: Moderate Fall Risk  Outcome: Goal Ongoing     Problem: Nutrition Deficit  Goal: Adequate nutritional intake  Outcome: Goal Ongoing  Goal: Body weight within specified parameters  Outcome: Goal Ongoing

## 2023-12-13 NOTE — Progress Notes
 General Progress Note    Name:  Daryl Baker     Today's Date:  12/13/2023  Admission Date: 12/08/2023  LOS: 5 days                     Assessment/Plan:    Principal Problem:    Oropharyngeal mass  Active Problems:    Severe malnutrition      68 y.o. 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 He has had increasing pain over the last few days prior to admission near his oropharyngeal mass on the left along with the left aspect of his tongue preventing any PO intake and has a scheduled G-tube placement.    Interval Events 3/15:   -Palliative Care consult:   >Add additional 12 mcg fentanyl patch today for total 37.5 mcg fentanyl patch  >Increase oxycodone frequency; oxycodone 5 to 10 mg every 3 hours as needed  >Add gabapentin 300 mg 3 times daily  - Transition to bolus feeds 3/15  - Awaiting PT/OT eval   -He will need the G-tube EN for >90 days.    Dysphagia   Oropharyngeal pain  -Due to left oropharyngeal cancer  -Patient endorses inability to take anything PO including oral meds  -Feels weak and fatigued  -G-tube placed 3/12 AM  -He will need the G-tube feeds for >90 days.  Plan  >Magic mouthwash TID PRN for oropharyngeal pain  >PO PTA pain regimen converted to fentanyl patch 25 mcg, dilaudid 0.5-1mg  q4h, oxycodone solution 5-10 mg  >1.5 L IV Lactated Ringers for fluid supplementation  >IV labetalol PRN for SBP >180 as he cannot tolerate PO PTA antihypertensives  > Start tube feeds at 0906 12/11/23   > Consulted Dietician  >continuous standard infusion of TwoCal HN with goal rate 55 mL/hr x 24 hrs.   >Start at 10 mL/hr, advancing rate as tolerated by 10 mL/hr Q8hrs to goal rate  >100mg  thiamine daily x 5-7 days  >Monitor for refeeding syndrome: daily Mg, Phos, K daily  > Palliative Care consult:   >Add additional 12 mcg fentanyl patch today for total 37.5 mcg fentanyl patch  >Increase oxycodone frequency; oxycodone 5 to 10 mg every 3 hours as needed  >Add gabapentin 300 mg 3 times daily  >Increased senna & PEG to qday scheduled via Gtube     Left HPV-Mediated Oropharyngeal SCC  -Currently on chemo and radiation  -Follow in Oncology clinic with dr Neita Carp and radiation Oncology Dr Mariane Masters  -CT Neck revealed:  1.  Marked decrease size of the prior large left oropharyngeal mass. There   is a residual 1.7 x 1.5 cm lesion involving the left tonsil with central   fluid attenuation. Treated necrotic tumor is the leading consideration. A   post therapeutic retention cyst could appear similar. An abscess is   thought less likely though direct visualization and clinical correlation   is recommended regarding symptoms of infection.   2.  Improvement of bilateral cervical lymphadenopathy.   > Radiation Oncology with continue to perform radiation while inpatient.  >Triamcinolone cream for radiation induced dermatitis    Pancytopenia   Mild Neutropenia  -ANC <1.5  -likely due to Chemo & radiation  -follow with daily CBC     Constipation  -PRN MiraLAX and Senokot     Hypertension  >resume PTA  Losartan via G-tube  >Continue to hold metoprolol  DM type II  -most recent hgba1c from 10/03/23 was 6.8  -PTA on Metformin and Trulicity   >hold above medications  >Accuchecks + low dose correction factor    FEN:  IVF 1.5 L LR  DIET CLEAR LIQUID w/ Body mass index is 31.66 kg/m?Marland Kitchen   PPX:  40mg  qday enoxaparin, SCDs bilat  Full Code     Roseanna Rainbow, MD  ________________________________________________________________________    Subjective  Daryl Baker is a 68 y.o. male.  Tolerating continuous tube feeds and transitioning to bolus feeds today. Pain controlled        Medications  Scheduled Meds:enoxaparin (LOVENOX) syringe 40 mg, 40 mg, Subcutaneous, QDAY(21)  fentaNYL (DURAGESIC) 12 mcg/hr patch 1 patch, 1 patch, Transdermal, Q72H*   And  Verification of Patch Placement and Integrity - fentaNYL 12 mcg/hr, , Transdermal, BID  fentaNYL (DURAGESIC) 25 mcg/hr patch 1 patch, 1 patch, Transdermal, Q72H*   And  Verification of Patch Placement and Integrity - fentaNYL 25 mcg/hr, , Transdermal, BID  gabapentin (NEURONTIN) capsule 300 mg, 300 mg, Per G Tube, TID  insulin aspart (U-100) (NOVOLOG FLEXPEN U-100 INSULIN) injection PEN 0-6 Units, 0-6 Units, Subcutaneous, ACHS (22)  [Held by Provider] losartan (COZAAR) tablet 100 mg, 100 mg, Per G Tube, QDAY  [Held by Provider] metoprolol succinate XL (TOPROL XL) tablet 100 mg, 100 mg, Oral, QDAY  polyethylene glycol 3350 (MIRALAX) packet 17 g, 1 packet, Per G Tube, BID  sennosides-docusate sodium (SENOKOT-S) tablet 1 tablet, 1 tablet, Per G Tube, BID  thiamine hcl (vitamin B1) injection 100 mg, 100 mg, Intravenous, QDAY  triamcinolone acetonide (KENALOG) 0.1 % topical cream 1 g, 1 g, Topical, BID    Continuous Infusions:   Diet Enteral Feeding Standard Infusion 55 mL/hr at 12/13/23 0220     PRN and Respiratory Meds:bisacodyL QDAY PRN, dextrose 50% (D50) IV PRN, diphenhydrAMINE/lidocaine/mag,al-simeth(#) TID PRN, HYDROmorphone (DILAUDID) injection Q4H PRN, labetalol (NORMODYNE; TRANDATE) injection Q6H PRN, melatonin QHS PRN, ondansetron Q6H PRN **OR** ondansetron (ZOFRAN) IV Q6H PRN, oxyCODONE Q3H PRN, pancrelipase 20,880 Units/sodium bicarbonate 650 mg ([place] CLOG DESTROYER) PRN (On Call from Rx), sodium chloride PRN        Objective:                          Vital Signs: Last Filed                 Vital Signs: 24 Hour Range   BP: 151/75 (03/15 0442)  Temp: 37.2 ?C (99 ?F) (03/15 0442)  Pulse: 120 (03/15 0442)  Respirations: 18 PER MINUTE (03/15 0442)  SpO2: 94 % (03/15 0442)  O2 Device: None (Room air) (03/15 0442) BP: (128-151)/(63-81)   Temp:  [36.6 ?C (97.8 ?F)-37.2 ?C (99 ?F)]   Pulse:  [108-120]   Respirations:  [18 PER MINUTE]   SpO2:  [94 %-98 %]   O2 Device: None (Room air)   Intensity Pain Scale (Self Report): 10 (12/12/23 2044) Vitals: 12/08/23 0907 12/08/23 1445 12/09/23 1715   Weight: 102 kg (224 lb 13.9 oz) 101.5 kg (223 lb 12.8 oz) 101.5 kg (223 lb 12.8 oz)       Intake/Output Summary:  (Last 24 hours)    Intake/Output Summary (Last 24 hours) at 12/13/2023 0752  Last data filed at 12/13/2023 0341  Gross per 24 hour   Intake 120 ml   Output 500 ml   Net -380 ml           Physical  Exam   General: well developed in No distress w/ Body mass index is 31.66 kg/m?Marland Kitchen   Skin: no rash or wounds, normal capillary refill  HEENT: erythematous lesion on left side of tongue consistent with mucositis  Neck: supple  Heart: S1 S2, no murmur  Lungs: clear bilat  Abd: soft, NT, ND, +bs, no hepatosplenomegaly  Musculoskeletal: full ROM  Vascular: radial and pedal pulses palpable, no edema  Neuro: alert and oriented, MAE, speech intact       Point of Care Testing  (Last 24 hours)  Glucose: (!) 242 (12/13/23 0427)  POC Glucose (Download): (!) 210 (12/12/23 2201)    Radiology and other Diagnostics Review:    CT NECK W/CONTRAST  Result Date: 12/08/2023  1.  Marked decrease size of the prior large left oropharyngeal mass. There is a residual 1.7 x 1.5 cm lesion involving the left tonsil with central fluid attenuation. Treated necrotic tumor is the leading consideration. A post therapeutic retention cyst could appear similar. An abscess is thought less likely though direct visualization and clinical correlation is recommended regarding symptoms of infection. 2.  Improvement of bilateral cervical lymphadenopathy. By my electronic signature, I attest that I have personally reviewed the images for this examination and formulated the interpretations and opinions expressed in this report  Finalized by Marily Memos, M.D. on 12/08/2023 12:00 PM. Dictated by Jerene Pitch, MD on 12/08/2023 11:07 AM.    CT HEAD WO CONTRAST  Result Date: 12/08/2023  1.  No acute intracranial hemorrhage or mass effect. 2.  Mild patchy cerebral white matter hypodensities, likely sequelae of chronic microvascular ischemia. 3.  The CT neck is dictated separately. Please see that report. By my electronic signature, I attest that I have personally reviewed the images for this examination and formulated the interpretations and opinions expressed in this report  Finalized by Marily Memos, M.D. on 12/08/2023 12:00 PM. Dictated by Jerene Pitch, MD on 12/08/2023 10:51 AM.       Justine Null, MD   Internal Medicine-Psychiatry Resident, PGY1  Available on Voalte     Seen & discussed with Dr. Jeannette Corpus

## 2023-12-13 NOTE — Progress Notes
 PHYSICAL THERAPY  ASSESSMENT/DISCHARGE NOTE      Name: Daryl Baker   MRN: [id number]     DOB: 08-07-1956      Age: 68 y.o.  Admission Date: 12/08/2023     LOS: 5 days     Date of Service: 12/13/2023      Mobility  Mobility Level [facility] Highest Level of Mobility (JH-HLM): Walk 250 feet or more  Distance Walked (feet): 400 ft  Level of Assistance: Stand by assistance  Assistive Device: Walker  Activity Limited By: No limitations    Subjective  Significant Hospital Events: 68 y.o. male with with a past medical history of hypertension, diabetes type 2, left-sided HPV mediated oropharyngeal cancer undergoing radiation therapy who presented to [facility] on 12/08/2023 for dysphagia, oropharyngeal pain and inability to tolerate p.o. intake. The palliative care team was consulted for assistance with symptom management for radiation induced mucositis.  Mental / Cognitive: Alert;Oriented;Cooperative;Follows commands  Pain: Complains of pain  Pain Location: Throat (when speaking)  Pain Interventions: Treatment altered to patient's pain tolerance  Persons Present: Spouse    Home Living Situation  Lives With: Spouse/significant other  Type of Home: House  Entry Stairs: No stairs;Ramp  In-Home Stairs: Able to live on main level  Patient Owned Equipment: Walker: Rollator    Prior Level of Function  Level Of Independence: Independent with ADL and community mobility with device  History of Falls in Past 3 Months: No    Strength  Overall Strength: WFL    Posture/Neurological  LLE Sensation/Proprioception: No Deficits Noted  RLE Sensation/Proprioception: No Deficits Noted    Bed Mobility/Transfer  Comments: Pt in chair at beginning and end of session.  Transfer Type: Sit to/from Stand  Transfer: Assistance Level: Modified Independent;To/From;Bed Side Chair  Transfer: Assistive Device: 4-Wheeled Environmental consultant  Transfers: Type Of Assistance: For Safety Considerations  End Of Activity Status: Up in Chair;Nursing Notified;Instructed Patient to Request Assist with Mobility;Instructed Patient to Use Call Light  Comments: Pt has been up ad lib, or ambulating with family    Gait  Gait Distance: 400 feet  Gait: Assistance Level: Standby Assist  Gait: Assistive Device: 4-Wheeled Walker  Gait: Descriptors: Pace: Normal;Swing-Through Gait;No balance loss;Normal step length  Activity Limited By: Patient Choice      Education  Persons Educated: Patient/Family  Patient Barriers To Learning: Impaired Communication (pain with talking)  Interventions: Family Education;Written Instructions Provided  Patient Response: Verbalized Understanding  Topics: Importance of Increasing Activity;Ambulate With Nursing;Ambulate with Family    Assessment/Progress  Assessment/Progress: Patient current status and level of safety suggests progression of activity can be achieved with nursing and/or family and does not require physical therapist intervention    AM-PAC 6 Clicks Basic Mobility Inpatient  Turning from your back to your side while in a flat bed without using bed rails: None  Moving from lying on your back to sitting on the side of a flat bed without using bedrails : None  Moving to and from a bed to a chair (including a wheelchair): None  Standing up from a chair using your arms (e.g. wheelchair, or bedside chair): None  To walk in hospital room: None  Climbing 3-5 steps with a railing: None  Basic Mobility Inpatient Raw Score: 24  Standardized (T-scale) Score: 57.68  Mobility Goal [facility]: JH-HLM 8 Walk >= 250 ft    Plan  Plan Frequency: No Further Treatment    PT Discharge Recommendations  Recommendation: Home with intermittent supervision/assistance  Recommendation for Therapy  Post Discharge: Home health  Patient Currently Requires Equipment: Owns what is needed    Therapist  Berenice Bouton, PT, DPT  Date  12/13/2023

## 2023-12-14 ENCOUNTER — Inpatient Hospital Stay: Admit: 2023-12-14 | Discharge: 2023-12-14 | Payer: MEDICARE

## 2023-12-14 ENCOUNTER — Encounter: Admit: 2023-12-14 | Discharge: 2023-12-14 | Payer: MEDICARE

## 2023-12-14 LAB — POC GLUCOSE
~~LOC~~ BKR POC GLUCOSE: 224 mg/dL — ABNORMAL HIGH (ref 70–100)
~~LOC~~ BKR POC GLUCOSE: 235 mg/dL — ABNORMAL HIGH (ref 70–100)
~~LOC~~ BKR POC GLUCOSE: 264 mg/dL — ABNORMAL HIGH (ref 70–100)
~~LOC~~ BKR POC GLUCOSE: 286 mg/dL — ABNORMAL HIGH (ref 70–100)
~~LOC~~ BKR POC GLUCOSE: 347 mg/dL — ABNORMAL HIGH (ref 70–100)

## 2023-12-14 LAB — CBC AND DIFF
~~LOC~~ BKR ABSOLUTE BASO COUNT: 0 10*3/uL — ABNORMAL LOW (ref 0.00–0.20)
~~LOC~~ BKR ABSOLUTE EOS COUNT: 0 10*3/uL — ABNORMAL LOW (ref 0.00–0.45)

## 2023-12-14 MED ORDER — PIPERACILLIN/TAZOBACTAM 4.5 G/NS (MB+)(EXTENDED INFUSION)
4.5 g | INTRAVENOUS | 0 refills | Status: CP
Start: 2023-12-14 — End: ?
  Administered 2023-12-15 – 2023-12-22 (×54): 4.5 g via INTRAVENOUS

## 2023-12-14 MED ORDER — HYDROMORPHONE (PF) 2 MG/ML IJ SYRG
.5-1 mg | INTRAVENOUS | 0 refills | Status: DC | PRN
Start: 2023-12-14 — End: 2023-12-24
  Administered 2023-12-14 – 2023-12-23 (×30): 1 mg via INTRAVENOUS
  Administered 2023-12-23: 11:00:00 0.5 mg via INTRAVENOUS

## 2023-12-14 MED ORDER — VANCOMYCIN 2,000 MG IVPB
20 mg/kg | Freq: Once | INTRAVENOUS | 0 refills | Status: CP
Start: 2023-12-14 — End: ?
  Administered 2023-12-15 (×2): 2000 mg via INTRAVENOUS

## 2023-12-14 MED ORDER — PIPERACILLIN/TAZOBACTAM 4.5 G/100ML NS IVPB (MB+)
4.5 g | INTRAVENOUS | 0 refills | Status: CP
Start: 2023-12-14 — End: ?
  Administered 2023-12-15 (×2): 4.5 g via INTRAVENOUS

## 2023-12-14 MED ORDER — OXYCODONE 5 MG/5 ML PO SOLN
15 mg | NASOGASTRIC | 0 refills | Status: DC | PRN
Start: 2023-12-14 — End: 2024-01-02
  Administered 2023-12-15 – 2024-01-02 (×85): 15 mg via NASOGASTRIC

## 2023-12-14 MED ORDER — SODIUM CHLORIDE 0.9% IV SOLP
500 mL | INTRAVENOUS | 0 refills | Status: CP
Start: 2023-12-14 — End: ?
  Administered 2023-12-15: 03:00:00 500 mL via INTRAVENOUS

## 2023-12-14 MED ORDER — CEFTRIAXONE INJ 2GM IVP
2 g | INTRAVENOUS | 0 refills | Status: DC
Start: 2023-12-14 — End: 2023-12-15

## 2023-12-14 MED ORDER — CEFTRIAXONE INJ 1GM IVP
1 g | INTRAVENOUS | 0 refills | Status: DC
Start: 2023-12-14 — End: 2023-12-15

## 2023-12-14 MED ORDER — VANCOMYCIN PHARMACY TO MANAGE
1 | 0 refills | Status: DC
Start: 2023-12-14 — End: 2023-12-15

## 2023-12-14 MED ORDER — ACETAMINOPHEN 160 MG/5 ML PO SOLN
650 mg | ORAL | 0 refills | Status: DC | PRN
Start: 2023-12-14 — End: 2024-01-02
  Administered 2023-12-15 – 2024-01-02 (×30): 650 mg via ORAL

## 2023-12-14 NOTE — Case Management (ED)
 Case Management Progress Note    NAME:Daryl Baker                          MRN: [id number]              DOB:[birth year]          AGE: 68 y.o.  ADMISSION DATE: 12/08/2023             DAYS ADMITTED: LOS: 6 days      Today's Date: 12/14/2023    PLAN: Ongoing    Expected Discharge Date: 12/14/2023   Is Patient Medically Stable: No, Please explain: pain control, bolus TF  Are there Barriers to Discharge? no    INTERVENTION/DISPOSITION:  Discharge Planning              Discharge Planning: Home Infusion-Enter[facility]  Weekend NCM reviewed EMR, patient continues with pain control, transitioning to bolus TF, not ready to discharge  Transportation              Does the Patient Need Case Management to Arrange Discharge Transport? (ex: facility, ambulance, wheelchair/stretcher, Medicaid, cab, other): No  Transportation Name, Phone and Availability #1: [employer] (Spouse)  [phone] (Mobile)  Support              Support: Huddle/team update  Info or Referral                 Positive SDOH Domains and Potential Barriers                   Medication Needs                                                                                                                                                         Financial                 Legal                 Other                 Discharge Disposition  Selected Continued Care - Admitted Since 12/08/2023    No services have been selected for the patient.           Carmelia Bake, RN    Carmelia Bake, NCM Weekend  On Voalte  Phone: [phone]

## 2023-12-14 NOTE — Progress Notes
 General Progress Note    Name:  Daryl Baker     Today's Date:  12/14/2023  Admission Date: 12/08/2023  LOS: 6 days                     Assessment/Plan:    Principal Problem:    Oropharyngeal mass  Active Problems:    Oropharyngeal cancer (CMS-HCC)    Severe malnutrition    Mouth pain    Drug-induced constipation      68 y.o. 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 He has had increasing pain over the last few days prior to admission near his oropharyngeal mass on the left along with the left aspect of his tongue preventing any PO intake and has a scheduled G-tube placement.    Interval Events 3/15:   -Palliative Care consult for pain management.    >Cont 37.38mcg fent patch   >Increase oxycodone to 15mg  q3h prn   > Increase Dilaudid 1mg  IV q2h prn   >Add gabapentin 300 mg 3 times daily  > Unfortunately patient refusing lidocaine swish and spit   - Transitioned to bolus feeds 3/15, tolerating well  - Awaiting PT/OT eval   - He will need the G-tube EN for >90 days.    Dysphagia   Oropharyngeal pain  -Due to left oropharyngeal cancer  -Patient endorses inability to take anything PO including oral meds  -Feels weak and fatigued  -G-tube placed 3/12 AM  -He will need the G-tube feeds for >90 days.  Plan  >Magic mouthwash TID PRN for oropharyngeal pain  >PO PTA pain regimen converted to fentanyl patch 25 mcg, dilaudid 0.5-1mg  q4h, oxycodone solution 5-10 mg  >1.5 L IV Lactated Ringers for fluid supplementation  >IV labetalol PRN for SBP >180 as he cannot tolerate PO PTA antihypertensives  > Start tube feeds at 0906 12/11/23   > Consulted Dietician  >Transitioned to tube feeds 3/15, formal recs in dietician note.   > Palliative Care consult for pain management as above.      Left HPV-Mediated Oropharyngeal SCC  -Currently on chemo and radiation  -Follow in Oncology clinic with dr Neita Carp and radiation Oncology Dr Mariane Masters  -CT Neck revealed:  1.  Marked decrease size of the prior large left oropharyngeal mass. There   is a residual 1.7 x 1.5 cm lesion involving the left tonsil with central   fluid attenuation. Treated necrotic tumor is the leading consideration. A   post therapeutic retention cyst could appear similar. An abscess is   thought less likely though direct visualization and clinical correlation   is recommended regarding symptoms of infection.   2.  Improvement of bilateral cervical lymphadenopathy.   > Radiation Oncology with continue to perform radiation while inpatient.(3/17-3/19)  >Triamcinolone cream for radiation induced dermatitis    Pancytopenia   Mild Neutropenia  -ANC <1.5  -likely due to Chemo & radiation  -follow with daily CBC     Constipation  -PRN MiraLAX and Senokot     Hypertension  >resume PTA  Losartan via G-tube  >Continue to hold metoprolol      DM type II  -most recent hgba1c from 10/03/23 was 6.8  -PTA on Metformin and Trulicity   >hold above medications  >Accuchecks + low dose correction factor    FEN:  Bolus tube feeds with free water flushes   DIET CLEAR  LIQUID w/ Body mass index is 31.66 kg/m?Marland Kitchen   PPX:  40mg  qday enoxaparin, SCDs bilat  Full Code     Roseanna Rainbow, MD  Discussed with Dr. Hyacinth Meeker   ________________________________________________________________________    Subjective  Daryl Baker is a 68 y.o. male.  Seen at bedside today and significantly frustrated with lack of pain control. I spoke on the phone with Okey Regal from palliative care team and we have some changes today, these were explained to Daryl Baker, and his bedside nurse. Goal will be controlling pain without overt sedation.        Medications  Scheduled Meds:Diet Enteral Feeding Bolus, , SEE ADMIN INSTRUCTIONS, 5XDAY (6,10,14,18,22   And  Water Bolus, 100 mL, PEG Tube, 5XDAY (6,10,14,18,22  enoxaparin (LOVENOX) syringe 40 mg, 40 mg, Subcutaneous, QDAY(21)  fentaNYL (DURAGESIC) 12 mcg/hr patch 1 patch, 1 patch, Transdermal, Q72H*   And  Verification of Patch Placement and Integrity - fentaNYL 12 mcg/hr, , Transdermal, BID  fentaNYL (DURAGESIC) 25 mcg/hr patch 1 patch, 1 patch, Transdermal, Q72H*   And  Verification of Patch Placement and Integrity - fentaNYL 25 mcg/hr, , Transdermal, BID  gabapentin (NEURONTIN) capsule 300 mg, 300 mg, Per G Tube, TID  insulin aspart (U-100) (NOVOLOG FLEXPEN U-100 INSULIN) injection PEN 0-6 Units, 0-6 Units, Subcutaneous, ACHS (22)  [Held by Provider] losartan (COZAAR) tablet 100 mg, 100 mg, Per G Tube, QDAY  [Held by Provider] metoprolol succinate XL (TOPROL XL) tablet 100 mg, 100 mg, Oral, QDAY  polyethylene glycol 3350 (MIRALAX) packet 17 g, 1 packet, Per G Tube, BID  sennosides-docusate sodium (SENOKOT-S) tablet 1 tablet, 1 tablet, Per G Tube, BID  thiamine hcl (vitamin B1) injection 100 mg, 100 mg, Intravenous, QDAY  triamcinolone acetonide (KENALOG) 0.1 % topical cream 1 g, 1 g, Topical, BID    Continuous Infusions:      PRN and Respiratory Meds:bisacodyL QDAY PRN, dextrose 50% (D50) IV PRN, diphenhydrAMINE/lidocaine/mag,al-simeth(#) TID PRN, HYDROmorphone (DILAUDID) injection Q4H PRN, labetalol (NORMODYNE; TRANDATE) injection Q6H PRN, melatonin QHS PRN, ondansetron Q6H PRN **OR** ondansetron (ZOFRAN) IV Q6H PRN, oxyCODONE Q3H PRN, pancrelipase 20,880 Units/sodium bicarbonate 650 mg ([place] CLOG DESTROYER) PRN (On Call from Rx), sodium chloride PRN        Objective:                          Vital Signs: Last Filed                 Vital Signs: 24 Hour Range   BP: 134/53 (03/16 0226)  Temp: 36.8 ?C (98.3 ?F) (03/16 1610)  Pulse: 98 (03/16 0226)  Respirations: 18 PER MINUTE (03/16 0226)  SpO2: 95 % (03/16 0226)  O2 Device: None (Room air) (03/16 0226) BP: (96-136)/(53-57)   Temp:  [36.8 ?C (98.3 ?F)-37.5 ?C (99.5 ?F)]   Pulse:  [92-108]   Respirations:  [18 PER MINUTE]   SpO2:  [95 %-100 %]   O2 Device: None (Room air) Intensity Pain Scale (Self Report): 10 (12/14/23 0601) Vitals:    12/08/23 0907 12/08/23 1445 12/09/23 1715   Weight: 102 kg (224 lb 13.9 oz) 101.5 kg (223 lb 12.8 oz) 101.5 kg (223 lb 12.8 oz)       Intake/Output Summary:  (Last 24 hours)    Intake/Output Summary (Last 24 hours) at 12/14/2023 0744  Last data filed at 12/14/2023 0602  Gross per 24 hour   Intake 3651 ml   Output 175 ml   Net  3476 ml           Physical Exam   General: well develop. Visibly upset on exam.    Skin: no rash or wounds, normal capillary refill  HEENT: erythematous lesion on left side of tongue consistent with mucositis  Neck: supple  Heart: S1 S2, no murmur  Resp: No resp distress   Abd: soft, NT, ND, G tube in place, no leakage around insertion site.   Musculoskeletal: full ROM  Neuro: alert and oriented, MAE, speech intact       Point of Care Testing  (Last 24 hours)  Glucose: (!) 239 (12/14/23 0236)  POC Glucose (Download): (!) 235 (12/14/23 1610)    Radiology and other Diagnostics Review:    CT NECK W/CONTRAST  Result Date: 12/08/2023  1.  Marked decrease size of the prior large left oropharyngeal mass. There is a residual 1.7 x 1.5 cm lesion involving the left tonsil with central fluid attenuation. Treated necrotic tumor is the leading consideration. A post therapeutic retention cyst could appear similar. An abscess is thought less likely though direct visualization and clinical correlation is recommended regarding symptoms of infection. 2.  Improvement of bilateral cervical lymphadenopathy. By my electronic signature, I attest that I have personally reviewed the images for this examination and formulated the interpretations and opinions expressed in this report  Finalized by Marily Memos, M.D. on 12/08/2023 12:00 PM. Dictated by Jerene Pitch, MD on 12/08/2023 11:07 AM.    CT HEAD WO CONTRAST  Result Date: 12/08/2023  1.  No acute intracranial hemorrhage or mass effect. 2.  Mild patchy cerebral white matter hypodensities, likely sequelae of chronic microvascular ischemia. 3.  The CT neck is dictated separately. Please see that report. By my electronic signature, I attest that I have personally reviewed the images for this examination and formulated the interpretations and opinions expressed in this report  Finalized by Marily Memos, M.D. on 12/08/2023 12:00 PM. Dictated by Jerene Pitch, MD on 12/08/2023 10:51 AM.

## 2023-12-14 NOTE — Response Teams
 Was notified of patient's new fever, chills, and tachycardia and initiated broad infectious work-up (CXR, urinalysis) and went to see the patient. Rapid response was called as the patient had a new oxygen requirement: O2 saturation decreased to 84% and required 2L and also appeared more confused than his baseline. A lactate, CMP, CBC, and EKG were ordered. Vancomycin & Zosyn were started & a liter of fluid given. Patient continued to deny any urinary, GI, or cardiac symptoms but did report feeling short of breath at this time.

## 2023-12-14 NOTE — Progress Notes
 Float Pool END OF SHIFT/ JHFRAT NOTE    Admission Date: 12/08/2023    Acute events, interventions, provider communication: N/A    Patient Interventions and Education  Fall Risk/JHFRAT Interventions and Education: (Charting when applicable)  Elimination Interventions : Place male/male urinal within reach   Medications : Educate patient on medication side effects  Patient Care Equipment: Does not need assistance with patient care equipment when ambulating, Ensure environment is free of clutter and walkways are clear from tripping hazards, and Assess need for patient equipment and remove if not in use  Mobility: Assist x1, Utilize walker, cane, or additional walking aid for ambulation, Elimination equipment at bedside (urinal or commode) , and Ensure the use of corrective lens and/or hearing aides are in place prior to ambulation  - refusing gait belt/staff assistance/staff within arm's reach  Cognition: Increase frequency of purposeful rounding  Risk for Moderate/Major Injury: Age: >65 yrs, Risk for fracture, and Active Anticoagulation    2. Restraints:  No     Restraint Goal: Patient will be free from injury while physically restrained.  See Docflowsheet for restraint documentation, interventions, education, etc.    Intake and Output:       Intake/Output Summary (Last 24 hours) at 12/14/2023 0741  Last data filed at 12/14/2023 0602  Gross per 24 hour   Intake 3651 ml   Output 175 ml   Net 3476 ml              Last Bowel Movement Date: 12/08/23 (stool softener/laxative given)

## 2023-12-14 NOTE — Progress Notes
 PALLIATIVE CARE INPATIENT NOTE     Name: Daryl Baker            MRN: [id number]                DOB: 1955/12/13          Age: 68 y.o.  Admission Date: 12/08/2023             LOS: 6 days    ASSESSMENT/PLAN   Daryl Baker is a 68 y.o. 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 intake. The palliative care team was consulted for assistance with symptom management for radiation induced mucositis.    # Pain, secondary to malignancy  # Oropharyngeal carcinoma, left, HPV mediated  # Dysphagia  # Poor p.o. intake    Discussion:  Met with patient and his wife who is also present at time of visit today.  Patient tells me his pain is a 27/10 and the pain is so severe that he is not really able to speak - he is communicating by writing this am.  It has been not quite a full 24 hours since patch dose of fentanyl increased yesterday from 25 to .  Will  not make further adjustment on this quite yet but instead will increase frequency of IV dilaudid for this severe mucositis pain.  Reviewed plan with patient and his wife and encouraged them to try the 0.5mg  dose more frequently to see how this worked for him but ultimately since he is the one feeling the pain he can decide which he needs.  Discussed with RN at bedside as well.      RECOMMENDATIONS:   Continue present dose of fentanyl patch ( ) increased from 25 on 3/15  Continue oxycodone 5 to 10 mg every 3 hours as needed - required 6 doses (60mg ) in the past 24 hours.   Increase Dilaudid 0.5 to 1mg  IV Q2 hours PRN (ordered)  Continue gabapentin 300 mg 3 times daily  Continue aggressive bowel regimen and patient encouraged to increase fluid intake and ambulate in halls to promote BM as well.      High medical decision making due to the following (2 of 3 elements):  1 or more chronic illness with side effects due to treatment  Drug therapy requiring intensive monitoring for toxicity  IV opioids    Radene Knee APRN, [facility]  Palliative Care CNS  Preferred communication via [place]  Office:  [phone]  Nights/Weekends - Page [phone] for Palliative Care On-Call    Note created with voice dictation software, please excuse grammatical and typographical errors.         PALLIATIVE CARE PLANNING     Advance Care Planning:   Identified Health Care Decision Maker:    DPOA - Would benefit from completion of DPOA  TPOPP - Not discussed    PC Clinic - NA    Medication safety - Advise a Narcan prescription on discharge    Disposition planning: Pending       SUBJECTIVE     CC/Reason for Visit: Symptoms (pain)    Interval HPI: Rating pain at 27/10 today. Severe pain in his mouth and on the sides of his tongue.  Pain makes it impossible for him to speak, he is communicating through writing in a notebook.      ROS: Review  of Systems   Constitutional:  Positive for malaise/fatigue.   HENT:          Severe mouth pain.     Gastrointestinal:  Positive for constipation.       OBJECTIVE     Blood pressure 137/69, pulse 118, temperature 36.4 ?C (97.5 ?F), height 179.1 cm (5' 10.5), weight 101.5 kg (223 lb 12.8 oz), SpO2 95%.  Physical Exam  Constitutional:       Comments: Uncomfortable appearing male, sitting up in chair at bedside, restless secondary to pain.   HENT:      Head: Normocephalic.      Mouth/Throat:      Comments: Moist and pink mouth but with white areas noted on sides of tongue and mouth.   Eyes:      Extraocular Movements: Extraocular movements intact.   Cardiovascular:      Rate and Rhythm: Normal rate.      Pulses: Normal pulses.   Pulmonary:      Effort: Pulmonary effort is normal.   Abdominal:      General: There is distension.      Palpations: Abdomen is soft.      Tenderness: There is no abdominal tenderness.   Musculoskeletal:         General: Normal range of motion.   Skin:     General: Skin is warm and dry.      Coloration: Skin is pale.   Neurological:      Mental Status: He is alert and oriented to person, place, and time.   Psychiatric:         Mood and Affect: Mood normal.         Behavior: Behavior normal.         Lab Results:  CBC   Lab Results   Component Value Date/Time    WBC 1.70 (L) 12/14/2023 02:36 AM    HGB 9.5 (L) 12/14/2023 02:36 AM    PLTCT 210 12/14/2023 02:36 AM     Lab Results   Component Value Date/Time    NEUT 65.4 12/14/2023 02:36 AM    ANC 1.10 (L) 12/14/2023 02:36 AM      Chemistries   Lab Results   Component Value Date/Time    NA 134 (L) 12/14/2023 02:36 AM    K 4.4 12/14/2023 02:36 AM    BUN 21 12/14/2023 02:36 AM    CR 0.66 12/14/2023 02:36 AM    GLU 239 (H) 12/14/2023 02:36 AM     Lab Results   Component Value Date/Time    CA 8.7 12/14/2023 02:36 AM    PO4 4.0 12/14/2023 02:36 AM    ALBUMIN 3.3 (L) 12/14/2023 02:36 AM    TOTPROT 6.2 12/14/2023 02:36 AM    ALKPHOS 54 12/14/2023 02:36 AM    AST 8 12/14/2023 02:36 AM    ALT 10 12/14/2023 02:36 AM    TOTBILI 0.5 12/14/2023 02:36 AM    GFR >60 12/14/2023 02:36 AM        Other Pertinent Diagnostic Results:   12/14/23 EMR reviewed - no new imaging noted.

## 2023-12-14 NOTE — Progress Notes
 Patient/family declined:  Bed/chair alarm, W/in arms reach during toileting/showering, and Ambulation.    Patient/family educated on importance of intervention to their safety/quality of care. Patient/family continues to decline care.    Reason Why Patient/Family declined:  pt would like to ambulate on his own    Individualized safety and/or care plan implemented. If additional safety measures implemented, please list them.     Escalated to:  Unit coordinator/charge nurse.

## 2023-12-15 ENCOUNTER — Encounter: Admit: 2023-12-15 | Discharge: 2023-12-15 | Payer: MEDICARE

## 2023-12-15 ENCOUNTER — Inpatient Hospital Stay: Admit: 2023-12-15 | Discharge: 2023-12-15 | Payer: MEDICARE

## 2023-12-15 DIAGNOSIS — K1230 Oral mucositis (ulcerative), unspecified: Secondary | ICD-10-CM

## 2023-12-15 LAB — COMPREHENSIVE METABOLIC PANEL
~~LOC~~ BKR ALBUMIN: 3.2 g/dL — ABNORMAL LOW (ref 3.5–5.0)
~~LOC~~ BKR ALK PHOSPHATASE: 53 U/L — ABNORMAL LOW (ref 25–110)
~~LOC~~ BKR ALT: 9 U/L (ref 7–56)
~~LOC~~ BKR ALT: 7 U/L — ABNORMAL LOW (ref 7–56)
~~LOC~~ BKR ANION GAP: 10 (ref 3–12)
~~LOC~~ BKR ANION GAP: 8 10*3/uL — ABNORMAL HIGH (ref 3–12)
~~LOC~~ BKR AST: 10 U/L — ABNORMAL LOW (ref 7–40)
~~LOC~~ BKR AST: 9 U/L — ABNORMAL HIGH (ref 7–40)
~~LOC~~ BKR BLD UREA NITROGEN: 23 mg/dL — ABNORMAL LOW (ref 7–25)
~~LOC~~ BKR BLD UREA NITROGEN: 20 mg/dL — ABNORMAL LOW (ref 7–25)
~~LOC~~ BKR CALCIUM: 7.9 mg/dL — ABNORMAL LOW (ref 8.5–10.6)
~~LOC~~ BKR CHLORIDE: 99 mmol/L — ABNORMAL LOW (ref 98–110)
~~LOC~~ BKR CHLORIDE: 101 mmol/L — ABNORMAL LOW (ref 98–110)
~~LOC~~ BKR CO2: 24 mmol/L (ref 21–30)
~~LOC~~ BKR CO2: 27 mmol/L — ABNORMAL LOW (ref 21–30)
~~LOC~~ BKR GLOMERULAR FILTRATION RATE (GFR): >60 mL/min (ref >60)
~~LOC~~ BKR GLOMERULAR FILTRATION RATE (GFR): >60 mL/min — ABNORMAL LOW (ref >60–4.80)
~~LOC~~ BKR GLUCOSE, RANDOM: 291 mg/dL — ABNORMAL HIGH (ref 70–100)
~~LOC~~ BKR POTASSIUM: 4.5 mmol/L — ABNORMAL LOW (ref 3.5–5.1)
~~LOC~~ BKR POTASSIUM: 4.8 mmol/L — ABNORMAL LOW (ref 3.5–5.1)
~~LOC~~ BKR SODIUM, SERUM: 133 mmol/L — ABNORMAL LOW (ref 137–147)
~~LOC~~ BKR SODIUM, SERUM: 136 mmol/L — ABNORMAL LOW (ref 137–147)
~~LOC~~ BKR TOTAL BILIRUBIN: 0.8 mg/dL — ABNORMAL HIGH (ref 0.2–1.3)
~~LOC~~ BKR TOTAL PROTEIN: 5.6 g/dL — ABNORMAL LOW (ref 6.0–8.0)

## 2023-12-15 LAB — CBC
~~LOC~~ BKR MCH: 34 pg — ABNORMAL LOW (ref 26.0–34.0)
~~LOC~~ BKR MCHC: 35 g/dL — ABNORMAL LOW (ref 32.0–36.0)
~~LOC~~ BKR MCV: 95 fL (ref 80.0–100.0)
~~LOC~~ BKR MPV: 7.4 fL — ABNORMAL LOW (ref 7.0–11.0)
~~LOC~~ BKR PLATELET COUNT: 213 10*3/uL — ABNORMAL LOW (ref 150–400)
~~LOC~~ BKR RDW: 16 % — ABNORMAL HIGH (ref 11.0–15.0)

## 2023-12-15 LAB — ECG 12-LEAD
P AXIS: 55 degrees (ref 6.0–8.0)
P-R INTERVAL: 184 ms — ABNORMAL HIGH (ref 26.0–34.0)
Q-T INTERVAL: 338 ms — ABNORMAL HIGH (ref 0.40–1.24)
QRS DURATION: 124 ms (ref 7–25)
QTC CALCULATION (BAZETT): 471 ms (ref 8.5–10.6)
R AXIS: 40 degrees (ref 0.2–1.3)
T AXIS: 39 degrees (ref 3.5–5.0)

## 2023-12-15 LAB — CBC AND DIFF
~~LOC~~ BKR ABSOLUTE BASO COUNT: 0 10*3/uL — ABNORMAL LOW (ref 0.00–0.20)
~~LOC~~ BKR ABSOLUTE EOS COUNT: 0 10*3/uL — ABNORMAL LOW (ref 0.00–0.45)
~~LOC~~ BKR ABSOLUTE MONO COUNT: 0.6 10*3/uL — ABNORMAL LOW (ref 0.00–0.80)
~~LOC~~ BKR MCHC: 33 g/dL — ABNORMAL LOW (ref 32.0–36.0)
~~LOC~~ BKR WBC COUNT: 2.7 10*3/uL — ABNORMAL LOW (ref 4.50–11.00)

## 2023-12-15 LAB — RVP VIRAL PANEL PCR

## 2023-12-15 LAB — POC GLUCOSE
~~LOC~~ BKR POC GLUCOSE: 229 mg/dL — ABNORMAL HIGH (ref 70–100)
~~LOC~~ BKR POC GLUCOSE: 278 mg/dL — ABNORMAL HIGH (ref 70–100)
~~LOC~~ BKR POC GLUCOSE: 330 mg/dL — ABNORMAL HIGH (ref 70–100)
~~LOC~~ BKR POC GLUCOSE: 387 mg/dL — ABNORMAL HIGH (ref 70–100)
~~LOC~~ BKR POC GLUCOSE: 364 mg/dL — ABNORMAL HIGH (ref 70–100)

## 2023-12-15 LAB — URINALYSIS DIPSTICK REFLEX TO CULTURE
~~LOC~~ BKR LEUKOCYTES: NEGATIVE
~~LOC~~ BKR NITRITE: NEGATIVE
~~LOC~~ BKR URINE BILE: NEGATIVE
~~LOC~~ BKR URINE KETONE: NEGATIVE
~~LOC~~ BKR URINE PH: 5 /HPF (ref 5.0–8.0)
~~LOC~~ BKR URINE SPEC GRAVITY: 1 /HPF (ref 1.005–1.030)

## 2023-12-15 LAB — NT-PRO-BNP: ~~LOC~~ BKR NT-PRO-BNP: 743 pg/mL — ABNORMAL HIGH (ref <125)

## 2023-12-15 LAB — HIGH SENSITIVITY TROPONIN I, RANDOM
~~LOC~~ BKR HIGH SENSITIVITY TROPONIN I: 24 ng/L — ABNORMAL HIGH (ref <20)
~~LOC~~ BKR HIGH SENSITIVITY TROPONIN I: 21 ng/L — ABNORMAL HIGH (ref <20)

## 2023-12-15 LAB — BLOOD GASES, PERIPHERAL VENOUS
~~LOC~~ BKR BASE EXCESS-VENOUS: 3.5 mmol/L
~~LOC~~ BKR BICARB, VENOUS(CAL): 27 mmol/L
~~LOC~~ BKR O2 SAT, VENOUS: 69 % (ref 55.0–71.0)
~~LOC~~ BKR PCO2-VENOUS: 43 mmHg (ref 36–50)
~~LOC~~ BKR PH-VENOUS: 7.4 — ABNORMAL HIGH (ref 7.30–7.40)
~~LOC~~ BKR PO2-VENOUS: 34 mmHg (ref 33–48)

## 2023-12-15 LAB — MRSA BY PCR (NASAL)

## 2023-12-15 LAB — PHOSPHORUS
~~LOC~~ BKR PHOSPHORUS: 3.3 mg/dL — ABNORMAL HIGH (ref 2.0–4.5)
~~LOC~~ BKR PHOSPHORUS: 3.9 mg/dL — ABNORMAL LOW (ref 2.0–4.5)

## 2023-12-15 MED ORDER — VANCOMYCIN 1,750 MG IVPB
1750 mg | Freq: Two times a day (BID) | INTRAVENOUS | 0 refills | Status: DC
Start: 2023-12-15 — End: 2023-12-15

## 2023-12-15 MED ORDER — SODIUM CHLORIDE 0.9 % IJ SOLN
50 mL | Freq: Once | INTRAVENOUS | 0 refills | Status: CP
Start: 2023-12-15 — End: ?
  Administered 2023-12-15: 14:00:00 50 mL via INTRAVENOUS

## 2023-12-15 MED ORDER — INSULIN ASPART 100 UNIT/ML SC FLEXPEN
0-12 [IU] | Freq: Before meals | SUBCUTANEOUS | 0 refills | Status: DC
Start: 2023-12-15 — End: 2023-12-19

## 2023-12-15 MED ORDER — VANCOMYCIN 1,500 MG IVPB (VIAL/BAG ADAPTER)
1500 mg | Freq: Two times a day (BID) | INTRAVENOUS | 0 refills | Status: DC
Start: 2023-12-15 — End: 2023-12-15

## 2023-12-15 MED ORDER — IOHEXOL 350 MG IODINE/ML IV SOLN
60 mL | Freq: Once | INTRAVENOUS | 0 refills | Status: CP
Start: 2023-12-15 — End: ?
  Administered 2023-12-15: 14:00:00 60 mL via INTRAVENOUS

## 2023-12-15 MED ORDER — GLYBURIDE 1.25 MG PO TAB
2.5 mg | Freq: Once | ORAL | 0 refills | Status: CP
Start: 2023-12-15 — End: ?
  Administered 2023-12-15: 21:00:00 2.5 mg via ORAL

## 2023-12-15 MED FILL — FENTANYL 25 MCG/HR TD PT72: 25 mcg/hr | TRANSDERMAL | 30 days supply | Qty: 10 | Fill #1 | Status: AC

## 2023-12-15 NOTE — Progress Notes
 General Progress Note    Name:  Daryl Baker     Today's Date:  12/15/2023  Admission Date: 12/08/2023  LOS: 7 days                     Assessment/Plan:    Principal Problem:    Oropharyngeal mass  Active Problems:    Oropharyngeal cancer (CMS-HCC)    Severe malnutrition    Mouth pain    Drug-induced constipation      68 y.o. 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 He has had increasing pain over the last few days prior to admission near his oropharyngeal mass on the left along with the left aspect of his tongue preventing any PO intake and has a scheduled G-tube placement.    Interval Events 3/15:   - Rapid response 3/16 PM d/t new fever, tachycardia, new O2 req & AMS ->    - He will need the G-tube EN for >90 days.  - Patient refusing     Sepsis  Neutropenic fever  -tachycardia, fever, increase in WBC (leukopenic at baseline)  -source likely pulmonary given imaging findings  -UA unremarkable  -CTA Chest: Development of a few clustered nodular and patchy opacities within the   right greater than left lower lobes, probably infectious or aspiration-related.   -MRSA nares neg  -EKG: RBBB, no ischemic changes  -lactic acid = 1.9  >continue zosyn, discontinue vancomycin    Dysphagia   Oropharyngeal pain  -Due to left oropharyngeal cancer  -Patient endorses inability to take anything PO including oral meds  -Feels weak and fatigued  -G-tube placed 3/12 AM  -He will need the G-tube feeds for >90 days.  Plan  >Magic mouthwash TID PRN for oropharyngeal pain  >PO PTA pain regimen converted to fentanyl patch 25 mcg, dilaudid 0.5-1mg  q4h, oxycodone solution 5-10 mg  >1.5 L IV Lactated Ringers for fluid supplementation  >IV labetalol PRN for SBP >180 as he cannot tolerate PO PTA antihypertensives  > Start tube feeds at 0906 12/11/23   > Consulted Dietician  >Transitioned to tube feeds 3/15, formal recs in dietician note.   > Palliative Care consult for pain management as above.      Left HPV-Mediated Oropharyngeal SCC  -Currently on chemo and radiation  -Follow in Oncology clinic with dr Neita Carp and radiation Oncology Dr Mariane Masters  -CT Neck revealed:  1.  Marked decrease size of the prior large left oropharyngeal mass. There   is a residual 1.7 x 1.5 cm lesion involving the left tonsil with central   fluid attenuation. Treated necrotic tumor is the leading consideration. A   post therapeutic retention cyst could appear similar. An abscess is   thought less likely though direct visualization and clinical correlation   is recommended regarding symptoms of infection.   2.  Improvement of bilateral cervical lymphadenopathy.   > Radiation Oncology with continue to perform radiation while inpatient.(3/17-3/19)  >Triamcinolone cream for radiation induced dermatitis    Pancytopenia   Mild Neutropenia  -ANC <1.5  -likely due to Chemo & radiation  -follow with daily CBC     Constipation  -PRN MiraLAX and Senokot     Hypertension  >resume PTA  Losartan via G-tube  >Continue to hold metoprolol      DM type II  -most recent hgba1c from 10/03/23 was 6.8  -PTA on  Metformin and Trulicity   >hold above medications  >Accuchecks + low dose correction factor    FEN:  Bolus tube feeds with free water flushes   DIET CLEAR LIQUID w/ Body mass index is 31.66 kg/m?Marland Kitchen   PPX:  40mg  qday enoxaparin, SCDs bilat  Full Code     Roseanna Rainbow, MD  Discussed with Dr. Hyacinth Meeker   ________________________________________________________________________    Subjective  Daryl Baker is a 68 y.o. male.  He continued to be frustrated with lack of pain control when seen in the AM. He was A&O x2 but did appear to recall the rapid response from the night before.        Medications  Scheduled Meds:Diet Enteral Feeding Bolus, , SEE ADMIN INSTRUCTIONS, 5XDAY (6,10,14,18,22   And  Water Bolus, 100 mL, PEG Tube, 5XDAY (6,10,14,18,22  enoxaparin (LOVENOX) syringe 40 mg, 40 mg, Subcutaneous, QDAY(21)  fentaNYL (DURAGESIC) 12 mcg/hr patch 1 patch, 1 patch, Transdermal, Q72H*   And  Verification of Patch Placement and Integrity - fentaNYL 12 mcg/hr, , Transdermal, BID  fentaNYL (DURAGESIC) 25 mcg/hr patch 1 patch, 1 patch, Transdermal, Q72H*   And  Verification of Patch Placement and Integrity - fentaNYL 25 mcg/hr, , Transdermal, BID  gabapentin (NEURONTIN) capsule 300 mg, 300 mg, Per G Tube, TID  insulin aspart (U-100) (NOVOLOG FLEXPEN U-100 INSULIN) injection PEN 0-12 Units, 0-12 Units, Subcutaneous, ACHS (22)  [Held by Provider] losartan (COZAAR) tablet 100 mg, 100 mg, Per G Tube, QDAY  [Held by Provider] metoprolol succinate XL (TOPROL XL) tablet 100 mg, 100 mg, Oral, QDAY  piperacillin/tazobactam (ZOSYN) 4.5 g in sodium chloride 0.9% (NS) 100 mL IVPB (MB+)(EXTENDED INFUSION), 4.5 g, Intravenous, Q6H*  polyethylene glycol 3350 (MIRALAX) packet 17 g, 1 packet, Per G Tube, BID  sennosides-docusate sodium (SENOKOT-S) tablet 1 tablet, 1 tablet, Per G Tube, BID  triamcinolone acetonide (KENALOG) 0.1 % topical cream 1 g, 1 g, Topical, BID    Continuous Infusions:      PRN and Respiratory Meds:acetaminophen Q6H PRN, bisacodyL QDAY PRN, dextrose 50% (D50) IV PRN, diphenhydrAMINE/lidocaine/mag,al-simeth(#) TID PRN, HYDROmorphone (DILAUDID) injection Q2H PRN, labetalol (NORMODYNE; TRANDATE) injection Q6H PRN, melatonin QHS PRN, ondansetron Q6H PRN **OR** ondansetron (ZOFRAN) IV Q6H PRN, oxyCODONE Q3H PRN, pancrelipase 20,880 Units/sodium bicarbonate 650 mg ([place] CLOG DESTROYER) PRN (On Call from Rx), sodium chloride PRN        Objective:                          Vital Signs: Last Filed                 Vital Signs: 24 Hour Range   BP: 139/62 (03/17 0817)  Temp: 36.6 ?C (97.9 ?F) (03/17 4010)  Pulse: 119 (03/17 0817)  Respirations: 20 PER MINUTE (03/17 0334)  SpO2: 97 % (03/17 0817)  O2 Device: Nasal cannula (03/17 0817)  O2 Liter Flow: 2 Lpm (03/17 0817) BP: (127-153)/(50-89)   Temp:  [36.6 ?C (97.9 ?F)-39 ?C (102.2 ?F)]   Pulse:  [119-135]   Respirations:  [18 PER MINUTE-20 PER MINUTE]   SpO2:  [84 %-97 %]   O2 Device: Nasal cannula  O2 Liter Flow: 2 Lpm   Intensity Pain Scale (Self Report): 10 (12/14/23 2133) Vitals:    12/08/23 0907 12/08/23 1445 12/09/23 1715   Weight: 102 kg (224 lb 13.9 oz) 101.5 kg (223 lb 12.8 oz) 101.5 kg (223 lb 12.8 oz)       Intake/Output  Summary:  (Last 24 hours)    Intake/Output Summary (Last 24 hours) at 12/15/2023 1132  Last data filed at 12/15/2023 1610  Gross per 24 hour   Intake 1354 ml   Output 850 ml   Net 504 ml           Physical Exam   General: well develop. Visibly upset on exam.    Skin: no rash or wounds, normal capillary refill  HEENT: erythematous lesion on left side of tongue consistent with mucositis  Neck: supple  Heart: S1 S2, no murmur  Resp: No resp distress   Abd: soft, NT, ND, G tube in place, no leakage around insertion site.   Musculoskeletal: full ROM  Neuro: alert and oriented, MAE, speech intact       Point of Care Testing  (Last 24 hours)  Glucose: (!) 291 (12/15/23 0427)  POC Glucose (Download): (!) 330 (12/15/23 0935)    Radiology and other Diagnostics Review:    CT NECK W/CONTRAST  Result Date: 12/08/2023  1.  Marked decrease size of the prior large left oropharyngeal mass. There is a residual 1.7 x 1.5 cm lesion involving the left tonsil with central fluid attenuation. Treated necrotic tumor is the leading consideration. A post therapeutic retention cyst could appear similar. An abscess is thought less likely though direct visualization and clinical correlation is recommended regarding symptoms of infection. 2.  Improvement of bilateral cervical lymphadenopathy. By my electronic signature, I attest that I have personally reviewed the images for this examination and formulated the interpretations and opinions expressed in this report  Finalized by Marily Memos, M.D. on 12/08/2023 12:00 PM. Dictated by Jerene Pitch, MD on 12/08/2023 11:07 AM.    CT HEAD WO CONTRAST  Result Date: 12/08/2023  1.  No acute intracranial hemorrhage or mass effect. 2.  Mild patchy cerebral white matter hypodensities, likely sequelae of chronic microvascular ischemia. 3.  The CT neck is dictated separately. Please see that report. By my electronic signature, I attest that I have personally reviewed the images for this examination and formulated the interpretations and opinions expressed in this report  Finalized by Marily Memos, M.D. on 12/08/2023 12:00 PM. Dictated by Jerene Pitch, MD on 12/08/2023 10:51 AM.

## 2023-12-15 NOTE — Progress Notes
 Pt's blood sugar is 330 this morning, pt refused insulin. Dr Shelby Mattocks MD notified.

## 2023-12-15 NOTE — Progress Notes
 PALLIATIVE CARE INPATIENT NOTE     Name: Daryl Baker            MRN: [id number]                DOB: [birth year]          Age: 68 y.o.  Admission Date: 12/08/2023             LOS: 7 days    ASSESSMENT/PLAN   Ralston Venus is a 68 y.o. 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 intake. The palliative care team was consulted for assistance with symptom management for radiation induced mucositis.    # Pain, secondary to malignancy  # Oropharyngeal carcinoma, left, HPV mediated  # Dysphagia  # Poor p.o. intake    Discussion: Our team visited with [name] this morning.  He told us that he continues to have pain in his mouth.  We acknowledged his pain and told him that we were doing everything we can do to help control his pain.  We did let him know that he is not using all of the pain medicine that is available to him.  We reminded him that he does have IV Dilaudid available every 2 hours and he has the OxyContin available as well.  The palliative care team will continue to follow closely and plan to see him again tomorrow.  We will plan on talking with him about increasing his fentanyl patch dose tomorrow if his pain remains severe and he is adequately using his additional pain medications.    RECOMMENDATIONS:   Continue present dose of fentanyl patch ( ) increased from 25 on 3/15.  May increase to 50 mcg either tomorrow or the next day if needed.  Oxycodone increased to 15 mg every 3 hours as needed e - required 6 doses (55mg ) in the past 24 hours.   Increase Dilaudid 0.5 to 1mg  IV Q2 hours PRN (ordered)  Continue gabapentin 300 mg 3 times daily  Continue aggressive bowel regimen and patient encouraged to increase fluid intake and ambulate in halls to promote BM as well.      High medical decision making due to the following (2 of 3 elements):  1 or more chronic illness with side effects due to treatment  Drug therapy requiring intensive monitoring for toxicity  IV opioids    Hardie Pulley MD  Division of Palliative Medicine  Available on [place]  Available on [employer] [phone]  Palliative ON-CALL Pager [phone]         PALLIATIVE CARE PLANNING     Advance Care Planning:   Identified Health Care Decision Maker:    DPOA - Would benefit from completion of DPOA  TPOPP - Not discussed    PC Clinic - NA    Medication safety - Advise a Narcan prescription on discharge    Disposition planning: Pending       SUBJECTIVE     CC/Reason for Visit: Symptoms (pain)    Interval HPI: Rating pain at 27/10 today. Severe pain in his mouth and on the sides of his tongue.  Pain makes it impossible for him to speak, he is communicating through writing in a notebook.      ROS: Review of Systems   Constitutional:  Positive for malaise/fatigue.   HENT:  Severe mouth pain.     Gastrointestinal:  Positive for constipation.       OBJECTIVE     Blood pressure 134/59, pulse 102, temperature 37 ?C (98.6 ?F), height 179.1 cm (5' 10.5), weight 101.5 kg (223 lb 12.8 oz), SpO2 94%.  Physical Exam  Constitutional:       Comments: Uncomfortable appearing male, sitting up in in bed, restless secondary to pain.   HENT:      Head: Normocephalic.      Mouth/Throat:      Comments: Moist and pink mouth but with white areas noted on sides of tongue and mouth.   Eyes:      Extraocular Movements: Extraocular movements intact.   Cardiovascular:      Rate and Rhythm: Normal rate.      Pulses: Normal pulses.   Pulmonary:      Effort: Pulmonary effort is normal.   Abdominal:      General: There is distension.      Palpations: Abdomen is soft.      Tenderness: There is no abdominal tenderness.   Musculoskeletal:         General: Normal range of motion.   Skin:     General: Skin is warm and dry.      Coloration: Skin is pale.   Neurological:      Mental Status: He is alert and oriented to person, place, and time.   Psychiatric:         Mood and Affect: Mood normal.         Behavior: Behavior normal.         Lab Results:  CBC   Lab Results   Component Value Date/Time    WBC 2.70 (L) 12/15/2023 04:27 AM    HGB 8.3 (L) 12/15/2023 04:27 AM    PLTCT 203 12/15/2023 04:27 AM     Lab Results   Component Value Date/Time    NEUT 73.5 12/15/2023 04:27 AM    ANC 2.00 12/15/2023 04:27 AM      Chemistries   Lab Results   Component Value Date/Time    NA 136 (L) 12/15/2023 04:27 AM    K 4.8 12/15/2023 04:27 AM    BUN 20 12/15/2023 04:27 AM    CR 0.61 12/15/2023 04:27 AM    GLU 291 (H) 12/15/2023 04:27 AM     Lab Results   Component Value Date/Time    CA 7.9 (L) 12/15/2023 04:27 AM    PO4 3.9 12/15/2023 04:27 AM    ALBUMIN 3.2 (L) 12/15/2023 04:27 AM    TOTPROT 5.6 (L) 12/15/2023 04:27 AM    ALKPHOS 53 12/15/2023 04:27 AM    AST 9 12/15/2023 04:27 AM    ALT 7 12/15/2023 04:27 AM    TOTBILI 0.8 12/15/2023 04:27 AM    GFR >60 12/15/2023 04:27 AM        Other Pertinent Diagnostic Results:   12/14/23 EMR reviewed - no new imaging noted.

## 2023-12-15 NOTE — Progress Notes
 Chaplain Note: Responded to rapid response for 68 year old male patient. On arrival patient was asleep. No family was present. No spiritual assessment was required.    Admit Date: 12/08/2023         Rapid Response Team Progress Note    Date: 12/15/2023 Time: 3:01 AM  Patient: Daryl Baker  Attending: Magda Paganini, MD Service: Med-Oncology - 2062  Admission Date: 12/08/2023  LOS: 7 days    A Code/Rapid Response Timeline Event Report has been created for this patient on 03.16.2024 at 2211    The Attending physician,  was notified of this event.    Ulice Dash        Date/Time:                      User:                                      12/15/2023 3:00 AM Ulice Dash      PCU 5 PCU      The [facility] is available on Vo[employer]te or can be paged via the switchboard [phone]) for urgent and emergent needs.   The Spiritual Care team responds to other requests within 24-hours when submitted as a Chaplain Consult in O[facility].           Chaplain: Ulice Dash

## 2023-12-15 NOTE — Case Management (ED)
 Case Management Progress Note    NAME:Daryl Baker                          MRN: [id number]              DOB:[birth year]          AGE: 68 y.o.  ADMISSION DATE: 12/08/2023             DAYS ADMITTED: LOS: 7 days      Today's Date: 12/15/2023    PLAN: Anticipate pt will dc home once medically stable.     Expected Discharge Date: 12/17/2023   Is Patient Medically Stable: No, Please explain: AMS, fevers  Are there Barriers to Discharge? no    INTERVENTION/DISPOSITION:  Discharge Planning              Discharge Planning: Home Infusion-[facility], Home Health  Transportation              Does the Patient Need Case Management to Arrange Discharge Transport? (ex: facility, ambulance, wheelchair/stretcher, Medicaid, cab, other): No  Transportation Name, Phone and Availability #1: gyasi, hazzard (Spouse)  [phone] (Mobile)  Support              Support: Pt/Family Updates re:POC or DC Plan, Huddle/team update   SW rounded with palliative care team   Collaborated with bedside nurse, who shares that pt does not want to take insulin until his blood sugars are at 700. Pain is an ongoing issue     Met with pt, who shares he is having ongoing pain - particularly in  his mouth; SW voalted bedside nurse to let her know that pt would like pain meds.    Okey Regal, RN, observed that pt was speaking clearer today   Dr. Marcelle Overlie shared that it's important to keep blood sugars  under control as this  helps healing. He inquired why pt was wanting to wait until his blood sugars are 700 before taking insulin. Pt suddenly had to urinate and asked the team to leave.      Will continue to follow       Info or Referral                 Positive SDOH Domains and Potential Barriers                   Medication Needs                                                                                                                                                         Financial                 Legal  Other                 Discharge Disposition                                                                                                                                                      Selected Continued Care - Admitted Since 12/08/2023    No services have been selected for the patient.       Briscoe Burns, LSCSW  [employer]

## 2023-12-15 NOTE — Progress Notes
 END OF SHIFT/PLAN OF CARE NURSING NOTE    Admission Date: 12/08/2023  Length of Stay: LOS: 7 days    Acute events, pain management, and nursing interventions: calm and cooperative    Communication with providers: pt's  blood sugar has been high through out the day, pt refused to take insulin. DR Logan Bores MD notified. 1400 schedules Feeding to be held till blood sugar is better,  one time 2.5mg  of glyburide given. Blood sugar 287 post  glyburide. 1800 schedule feeding given per dr Logan Bores.     Intake and Output: See above comment       Intake/Output Summary (Last 24 hours) at 12/15/2023 2018  Last data filed at 12/15/2023 1823  Gross per 24 hour   Intake 1811 ml   Output 850 ml   Net 961 ml        Last Bowel Movement Date: 12/08/23    Fall Risk/JHFRAT Interventions and Education:   Elimination: urinal within reach.   Medications: medication, insulin(pt continue to refuse insulin despite blood sugar being high,    Bed/chair alarm: on   Patient Care Equipment: need assistance with pt care equipment when ambulating   Mobility: assist X1, gait belt and walker used when ambulating   Cognition: confusion and togetherness.     Patient Education  Patient-specific education provided related to quality/safety:  CLABSI prevention, Fall risk, Pain scale , and Incontinence management  Patient-specific education provided (other): feeding tube, blood sugar and  insulin  Learners: Patient  Method/materials used: Verbal teaching  Response to learning: Some Evidence of Learning, Needs Reinforcement  Needs reinforcement on: education on blood sugar management.     Patient Goal(s):  Patient will Verbalize pain has improved by the end of shift         Patient will  Show diminished episodes of delirium by discharge date   Other:    Restraints:  No  Restraint Goal: Patient will be free from injury while physically restrained.  See Docflowsheet for restraint documentation, interventions, education, etc.

## 2023-12-15 NOTE — Progress Notes
 Pharmacy Vancomycin Note  Subjective:   Daryl Baker is a 68 y.o. male being treated for LRI.    Assessment:   Target levels for this patient:  1.  AUC (mcg*h/mL):  400-600  2.  Trough (mcg/mL): 10-20      Plan:   Given x1 vancomycin loading dose of 2000 mg; followed by a maintenance regimen of vancomycin 1500 mg every 12 hours. Estimating Vd= 0.65 L/kg, population based kinetics predicts AUC= 437 and Tr= 10.7  Next scheduled level(s): Once at steady state  Pharmacy will continue to monitor and adjust therapy as needed.      Objective:     Current Vancomycin Orders   Medication Dose Route Frequency    vancomycin (VANCOCIN) 1,500 mg in sodium chloride 0.9% 250 mL IVPB (Vial/Bag Adapter)  1,500 mg Intravenous Q12H*    vancomycin, pharmacy to manage  1 each Service Per Pharmacy       Recent Vancomycin Dosing and Administration:  Recent Vancomycin Admin                     vancomycin (VANCOCIN) 2,000 mg in sodium chloride 0.9% 290 mL IVPB (mg) 2,000 mg New Bag 12/14/23 2256                    Start Date of  vancomycin therapy: 12/14/2023  Additional Abx: Pip/tazo  Cultures:  ,  ,  ,    White Blood Cells   Date/Time Value Ref Range Status   12/15/2023 0427 2.70 (L) 4.50 - 11.00 10*3/uL Final   12/14/2023 2226 1.70 (L) 4.50 - 11.00 10*3/uL Final   12/14/2023 0236 1.70 (L) 4.50 - 11.00 10*3/uL Final   12/13/2023 0427 1.60 (L) 4.50 - 11.00 10*3/uL Final     Creatinine   Date/Time Value Ref Range Status   12/15/2023 0427 0.61 0.40 - 1.24 mg/dL Final   16/07/9603 5409 0.65 0.40 - 1.24 mg/dL Final   81/19/1478 2956 0.66 0.40 - 1.24 mg/dL Final     Blood Urea Nitrogen   Date/Time Value Ref Range Status   12/15/2023 0427 20 7 - 25 mg/dL Final     Estimated CrCl: 120 ml/min    Intake/Output Summary (Last 24 hours) at 12/15/2023 0547  Last data filed at 12/15/2023 0400  Gross per 24 hour   Intake 2278 ml   Output 625 ml   Net 1653 ml      UOP:    Actual Weight:  101.5 kg (223 lb 12.8 oz)  Dosing BW:  101.5 kg     Annett Fabian, [facility]  12/15/2023

## 2023-12-16 ENCOUNTER — Encounter: Admit: 2023-12-16 | Discharge: 2023-12-16 | Payer: MEDICARE

## 2023-12-16 ENCOUNTER — Ambulatory Visit: Admit: 2023-12-16 | Discharge: 2023-12-16 | Payer: MEDICARE

## 2023-12-16 DIAGNOSIS — J392 Other diseases of pharynx: Secondary | ICD-10-CM

## 2023-12-16 LAB — ECG 12-LEAD
P AXIS: 62 degrees — ABNORMAL LOW (ref 3–12)
P-R INTERVAL: 152 ms — ABNORMAL HIGH (ref 25–110)
Q-T INTERVAL: 328 ms — ABNORMAL HIGH (ref 0.0–2.0)
QRS DURATION: 118 ms — ABNORMAL HIGH (ref 0.0–5.0)
QTC CALCULATION (BAZETT): 493 ms (ref 21–30)
R AXIS: 22 degrees (ref >60–0.80)
T AXIS: 23 degrees (ref 0.00–0.45)
VENTRICULAR RATE: 136 {beats}/min — ABNORMAL LOW (ref 3.5–5.0)

## 2023-12-16 LAB — CBC AND DIFF
~~LOC~~ BKR ABSOLUTE BASO COUNT: 0 10*3/uL — ABNORMAL HIGH (ref 0.00–0.20)
~~LOC~~ BKR ABSOLUTE EOS COUNT: 0 10*3/uL — ABNORMAL HIGH (ref 0.00–0.45)
~~LOC~~ BKR ABSOLUTE LYMPH COUNT: 0.1 10*3/uL — ABNORMAL LOW (ref 1.00–4.80)
~~LOC~~ BKR ABSOLUTE MONO COUNT: 0.4 10*3/uL — ABNORMAL HIGH (ref 0.00–0.80)
~~LOC~~ BKR ABSOLUTE NEUTROPHIL: 1.3 10*3/uL — ABNORMAL LOW (ref 1.80–7.00)
~~LOC~~ BKR BASOPHILS %: 0.1 % — ABNORMAL LOW (ref 0.0–2.0)
~~LOC~~ BKR EOSINOPHILS %: 0.3 % — ABNORMAL LOW (ref 0.0–5.0)
~~LOC~~ BKR HEMATOCRIT: 23 % — ABNORMAL LOW (ref 40.0–50.0)
~~LOC~~ BKR MONOCYTES %: 20 % — ABNORMAL HIGH (ref 4.0–12.0)
~~LOC~~ BKR MPV: 7.1 fL — ABNORMAL HIGH (ref 7.0–11.0)
~~LOC~~ BKR PLATELET COUNT: 199 10*3/uL — ABNORMAL LOW (ref 150–400)
~~LOC~~ BKR RBC COUNT: 2.4 10*6/uL — ABNORMAL LOW (ref 4.40–5.50)
~~LOC~~ BKR RDW: 16 % — ABNORMAL HIGH (ref 11.0–15.0)
~~LOC~~ BKR WBC COUNT: 1.8 10*3/uL — ABNORMAL LOW (ref 4.50–11.00)

## 2023-12-16 LAB — POC GLUCOSE
~~LOC~~ BKR POC GLUCOSE: 265 mg/dL — ABNORMAL HIGH (ref 70–100)
~~LOC~~ BKR POC GLUCOSE: 184 mg/dL — ABNORMAL HIGH (ref 70–100)
~~LOC~~ BKR POC GLUCOSE: 220 mg/dL — ABNORMAL HIGH (ref 70–100)
~~LOC~~ BKR POC GLUCOSE: 303 mg/dL — ABNORMAL HIGH (ref 70–100)
~~LOC~~ BKR POC GLUCOSE: 437 mg/dL — ABNORMAL HIGH (ref 70–100)
~~LOC~~ BKR POC GLUCOSE: 282 mg/dL — ABNORMAL HIGH (ref 70–100)

## 2023-12-16 LAB — COMPREHENSIVE METABOLIC PANEL
~~LOC~~ BKR ALBUMIN: 3.1 g/dL — ABNORMAL LOW (ref 3.5–5.0)
~~LOC~~ BKR ALK PHOSPHATASE: 45 U/L — ABNORMAL LOW (ref 25–110)
~~LOC~~ BKR BLD UREA NITROGEN: 18 mg/dL — ABNORMAL HIGH (ref 7–25)
~~LOC~~ BKR CREATININE: 0.5 mg/dL — ABNORMAL HIGH (ref 0.40–1.24)
~~LOC~~ BKR GLUCOSE, RANDOM: 164 mg/dL — ABNORMAL HIGH (ref 70–100)
~~LOC~~ BKR POTASSIUM: 4.5 mmol/L — ABNORMAL LOW (ref 3.5–5.1)

## 2023-12-16 MED ORDER — PATCH DOCUMENTATION - FENTANYL 75 MCG/HR
Freq: Two times a day (BID) | TRANSDERMAL | 0 refills | Status: DC
Start: 2023-12-16 — End: 2023-12-19

## 2023-12-16 MED ORDER — SENNOSIDES-DOCUSATE SODIUM 8.6-50 MG PO TAB
2 | Freq: Two times a day (BID) | GASTROSTOMY | 0 refills | Status: DC
Start: 2023-12-16 — End: 2023-12-19
  Administered 2023-12-18 (×2): 2 via GASTROSTOMY

## 2023-12-16 MED ORDER — METFORMIN 500 MG PO TAB
500 mg | Freq: Two times a day (BID) | ORAL | 0 refills | Status: DC
Start: 2023-12-16 — End: 2023-12-22
  Administered 2023-12-16 – 2023-12-21 (×10): 500 mg via ORAL

## 2023-12-16 MED ORDER — FENTANYL 75 MCG/HR TD PT72
1 | TRANSDERMAL | 0 refills | Status: DC
Start: 2023-12-16 — End: 2023-12-19
  Administered 2023-12-16: 18:00:00 1 via TRANSDERMAL

## 2023-12-16 MED ORDER — GLYBURIDE 1.25 MG PO TAB
2.5 mg | Freq: Once | ORAL | 0 refills | Status: CP
Start: 2023-12-16 — End: ?
  Administered 2023-12-16: 19:00:00 2.5 mg via ORAL

## 2023-12-16 MED ORDER — GLYBURIDE 1.25 MG PO TAB
2.5 mg | Freq: Every day | ORAL | 0 refills | Status: DC
Start: 2023-12-16 — End: 2023-12-17
  Administered 2023-12-17: 16:00:00 2.5 mg via ORAL

## 2023-12-16 NOTE — Progress Notes
 Blood sugar 437,  pt continue to refuse insulin. Feeding not given.   Dr Logan Bores notified. GlyBuride ordered and given. Blood sugar 282 at 1547

## 2023-12-16 NOTE — Progress Notes
 CLINICAL NUTRITION                                                        Clinical Nutrition Follow-Up Assessment     Name: Daryl Baker   MRN: [id number]     DOB: 16-Oct-1955      Age: 68 y.o.  Admission Date: 12/08/2023     LOS: 8 days     Date of Service: 12/16/2023        Recommendation:  Least restrictive PO diet per SLP/primary team.     Continue EN bolus goal regimen of 5.5 cartons of TwoCal HN (or equivalent Nutren 2.0) per day.   TwoCal HN goal of 1 carton (237 mL) x 4 feeds per day + 1.5 cartons (~356 mL) once daily for a total of 5.5 cartons per day.   Goal provides 2613 kcal/day, 109 g protein/day, and 913 mL free water/day.   Provide at least 30 ml free water before and after each bolus feed. Additional fluids per primary team. Consider 50 mL water flush before and after each bolus feed. Consider providing additional 245 mL water bolus QID for hydration needs.     Monitor wt, intake, intake tolerance, GI function, and lab data.    Recommend ongoing follow-up with outpatient dietitian for tube feeding management. Pt will need G-tube EN for the foreseeable future.    The nutrition-related order modifications are made in communication with the primary service, who remains responsible for the orders and overall care of the patient.     Comments:  68 y.o. 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 intake admitted for further management.     Pt reached and tolerated continuous goal rate of TwoCal HN 55 mL/hr. Switched from continuous EN to bolus EN on 12/13/23 and was tolerating. Pt had rapid response with AMS and fever 3/16. Hyperglycemic yesterday morning 3/17 with elevated HR but pt declined insulin. Physician informed pt that it's important to keep BG under control as this helps healing, but pt still declined insulin. Pt's BG was high throughout the day yesterday. Bolus feed held until BG improved.  BG 287 post one-time dose glyburide yesterday.     RD services continuing to follow pt for EN management. Pt ended up receiving 4 out of 5 scheduled bolus feeds yesterday. Labs and meds reviewed. Glucose this morning 164. On ABX and stool softeners. Received 2 bolus feeds so far this morning. Pt unavailable for visit during nutrition follow-up attempt this morning.  RD discussed pt with RN who reports pt has been tolerating bolus feeds. Current EN recs remain appropriate. Pt remains at nutritional risk. Will continue to monitor.                            Nutrition Assessment of Patient:  Admit Weight: 102 kg; Weight Change Since Admit: -0.5 kg (Net I/O balance since admit approximately +4.2 L); DBW: 79.9 kg  BMI (Calculated): 31.66;    Pertinent Allergies/Intolerances: None noted     Oral Diet Order: Clear liquid;    Current EN Order: TwoCal HN bolus feeds: 1 carton (237 mL) QID + 1.5 carton (~  356 mL) at 2200 feeding.  Current Energy Intake: Inadequate  Intake Comment: 3-day EN average  Intake (calories) Daily Average : 2266 kilocalories (95% needs)  Intake (protein) Daily Average : 95 grams (91% needs)  Weight Used for Calculation: 79.9 kg  Estimated Calorie Needs: 2395-2795 kcal (30-35 kcal/kg DBW)  Estimated Protein Needs: 104-120 g protein (1.3-1.5 g protein/kg DBW)    Malnutrition Assessment:  Malnutrition present on admission  ICD-10 code E43: Chronic illness/Severe malnutrition        Energy intake: 75% or less of estimated energy requirement for 1 month or more, Weight loss: Greater than 20% x 1 year          Malnutrition Interventions: EN recs provided    Nutrition Focused Physical Exam:  Pending  Physical Assessment:  RLE Edema: Non-pitting  LLE Edema: Non-pitting  Pedal Edema: Non-pitting     Pressure Injury: None noted     Comment: LBM 12/15/23    Nutrition Diagnosis:  Inadequate oral intake  Etiology: mouth pain, PO intake intolerance, poor appetite  Signs & Symptoms: pt report per chart review, wife report, need for G-tube placement and EN recs                      Intervention / Plan:  Continue bolus EN  Will continue to monitor wt, intake, intake tolerance, and lab data           Reginal Lutes, MS, RDN, LD  Clinical Dietitian - Department of Clinical Nutrition  Available on [employer]

## 2023-12-16 NOTE — Progress Notes
 Daryl Baker.received radiation therapy treatment today.  31 of 33 treatments.

## 2023-12-16 NOTE — Progress Notes
 PALLIATIVE CARE INPATIENT NOTE     Name: Daryl Baker            MRN: [id number]                DOB: 11-21-1955          Age: 68 y.o.  Admission Date: 12/08/2023             LOS: 8 days    ASSESSMENT/PLAN   Daryl Baker is a 68 y.o. 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 intake. The palliative care team was consulted for assistance with symptom management for radiation induced mucositis.    # Pain, secondary to malignancy  # Oropharyngeal carcinoma, left, HPV mediated  # Dysphagia  # Poor p.o. intake    Discussion: Visited with Daryl Baker this morning. Mainly discussed symptoms today (see subjective). Discussed increasing fentanyl patch and remembering to ask for his PRN medications.     RECOMMENDATIONS:   Increase fentanyl patch to 75 mcg/hr (ordered)  Continue Oxycodone 15 mg Q3H PRN  Continue Dilaudid 0.5 to 1mg  IV Q2 hours PRN   Continue gabapentin 300 mg 3 times daily  Increase senna to 2 tab BID, continue miralax 1 packet twice daily.     PALLIATIVE CARE PLANNING     Advance Care Planning:   Identified Health Care Decision Maker:    DPOA - Would benefit from completion of DPOA  TPOPP - Not discussed    PC Clinic - NA    Medication safety - Advise a Narcan prescription on discharge    Disposition planning: Pending       SUBJECTIVE     CC/Reason for Visit: Symptoms (pain)    Interval HPI: Continues to have severe mouth pain. Pain medicine reduces his pain a small amount for only a short time. He has not been able to rest d/t pain. Discussed that he needs to ask for his pain medicine as it is not scheduled. Does not remember date of last BM but knows it has been awhile.     ROS: Review of Systems   Constitutional:  Positive for malaise/fatigue.   HENT:          Severe mouth pain.     Gastrointestinal: Positive for constipation.       OBJECTIVE     Blood pressure 129/59, pulse 110, temperature 36.2 ?C (97.1 ?F), height 179.1 cm (5' 10.5), weight 101.5 kg (223 lb 12.8 oz), SpO2 98%.  Physical Exam  Constitutional:       Comments: Uncomfortable appearing male, sitting up in in bed, restless secondary to pain.   HENT:      Head: Normocephalic.   Eyes:      Extraocular Movements: Extraocular movements intact.   Cardiovascular:      Rate and Rhythm: Normal rate.   Pulmonary:      Effort: Pulmonary effort is normal.   Abdominal:      General: There is distension.      Palpations: Abdomen is soft.      Tenderness: There is no abdominal tenderness.   Musculoskeletal:         General: Normal range of motion.      Cervical back: Normal range of motion.   Skin:     General: Skin is warm and dry.  Coloration: Skin is pale.   Neurological:      Mental Status: He is alert and oriented to person, place, and time.   Psychiatric:         Mood and Affect: Mood normal.         Behavior: Behavior normal.         Lab Results:  CBC   Lab Results   Component Value Date/Time    WBC 1.80 (L) 12/16/2023 02:51 AM    HGB 8.4 (L) 12/16/2023 02:51 AM    PLTCT 199 12/16/2023 02:51 AM     Lab Results   Component Value Date/Time    NEUT 72.1 12/16/2023 02:51 AM    ANC 1.30 (L) 12/16/2023 02:51 AM      Chemistries   Lab Results   Component Value Date/Time    NA 138 12/16/2023 02:51 AM    K 4.5 12/16/2023 02:51 AM    BUN 18 12/16/2023 02:51 AM    CR 0.50 12/16/2023 02:51 AM    GLU 164 (H) 12/16/2023 02:51 AM     Lab Results   Component Value Date/Time    CA 8.3 (L) 12/16/2023 02:51 AM    PO4 3.9 12/15/2023 04:27 AM    ALBUMIN 3.1 (L) 12/16/2023 02:51 AM    TOTPROT 5.9 (L) 12/16/2023 02:51 AM    ALKPHOS 45 12/16/2023 02:51 AM    AST 10 12/16/2023 02:51 AM    ALT 7 12/16/2023 02:51 AM    TOTBILI 0.5 12/16/2023 02:51 AM    GFR >60 12/16/2023 02:51 AM        Other Pertinent Diagnostic Results:   NA    Rachael Fee, FNP-C  Palliative Medicine Nurse Practitioner  Available on [facility]   Pager [phone]

## 2023-12-16 NOTE — Care Plan
 Problem: Discharge Planning  Goal: Participation in plan of care  Outcome: Goal Ongoing  Goal: Knowledge regarding plan of care  Outcome: Goal Ongoing  Goal: Prepared for discharge  Outcome: Goal Ongoing     Problem: Moderate Fall Risk  Goal: Moderate Fall Risk  Outcome: Goal Ongoing     Problem: Nutrition Deficit  Goal: Adequate nutritional intake  Outcome: Goal Ongoing  Goal: Body weight within specified parameters  Outcome: Goal Ongoing     Problem: Infection, Risk of  Goal: Absence of infection  Outcome: Goal Ongoing  Goal: Knowledge of Infection Control Procedures  Outcome: Goal Ongoing

## 2023-12-16 NOTE — Progress Notes
 Weekly Management Progress Note    Date: 12/16/2023    Daryl Baker is a 68 y.o. male.     Vitals:  Vitals:    12/16/23 0940   Weight: 101.5 kg (223 lb 12.8 oz)       Subjective   There were no encounter diagnoses.  Staging:  Cancer Staging   Oropharyngeal cancer (CMS-HCC)  Staging form: Pharynx - HPV-Mediated Oropharynx, AJCC 8th Edition  - Clinical stage from 10/14/2023: Stage II (cT3, cN2, cM0, p16+) - Signed by Monico Blitz, MD on 10/14/2023      Body mass index is 31.66 kg/m?Marland Kitchen             Treatment Data Summary:          12/05/2023     7:03 AM 12/08/2023     7:16 AM 12/09/2023     2:16 PM 12/10/2023     2:07 PM 12/11/2023     7:43 AM 12/12/2023     7:26 AM 12/16/2023     9:06 AM   Treatment Data   Course ID C1 Oropharynx 25  C1 Oropharynx 25  C1 Oropharynx 25  C1 Oropharynx 25  C1 Oropharynx 25  C1 Oropharynx 25  C1 Oropharynx 25    Plan ID H&N  H&N  H&N  H&N  H&N  H&N  H&N    Prescription Dose (cGy) 6,996  6,996  6,996  6,996  6,996  6,996  6,996    Prescribed Dose per Fraction (Gy) 2.12  2.12  2.12  2.12  2.12  2.12  2.12    Fractions Treated to Date 25  26  27  28  29  30  31     Total Fractions on Plan 33  33  33  33  33  33  33    Treatment Elapsed Days 32  35  36  37  38  39  43    Reference Point ID Oropharynx/H&N  Oropharynx/H&N  Oropharynx/H&N  Oropharynx/H&N  Oropharynx/H&N  Oropharynx/H&N  Oropharynx/H&N    Dosage Given to Date (Gy) 53  55.12  57.24  59.36  61.48  63.6  65.72         Subjective:  skipped treatment yesterday. On PEG tube, pain under control.    Objective:   Wt Readings from Last 5 Encounters:   12/09/23 101.5 kg (223 lb 12.8 oz)   12/16/23 101.5 kg (223 lb 12.8 oz)   12/08/23 102 kg (224 lb 13.9 oz)   12/05/23 104.8 kg (231 lb)   12/05/23 105.7 kg (233 lb)     CBC w diff    Lab Results   Component Value Date/Time    WBC 1.80 (L) 12/16/2023 02:51 AM    RBC 2.46 (L) 12/16/2023 02:51 AM    HGB 8.4 (L) 12/16/2023 02:51 AM    HCT 23.3 (L) 12/16/2023 02:51 AM    MCV 94.7 12/16/2023 02:51 AM    MCH 34.3 (H) 12/16/2023 02:51 AM    MCHC 36.2 (H) 12/16/2023 02:51 AM    RDW 16.8 (H) 12/16/2023 02:51 AM    PLTCT 199 12/16/2023 02:51 AM    MPV 7.1 12/16/2023 02:51 AM    Lab Results   Component Value Date/Time    NEUT 72.1 12/16/2023 02:51 AM    ANC 1.30 (L) 12/16/2023 02:51 AM    LYMA 7.4 (L) 12/16/2023 02:51 AM    ALC 0.10 (L) 12/16/2023 02:51 AM    MONA 20.1 (H) 12/16/2023  02:51 AM    AMC 0.40 12/16/2023 02:51 AM    EOSA 0.3 12/16/2023 02:51 AM    AEC 0.00 12/16/2023 02:51 AM    BASA 0.1 12/16/2023 02:51 AM    ABC 0.00 12/16/2023 02:51 AM        Comprehensive Metabolic Profile    Lab Results   Component Value Date/Time    NA 138 12/16/2023 02:51 AM    K 4.5 12/16/2023 02:51 AM    CL 103 12/16/2023 02:51 AM    GLU 164 (H) 12/16/2023 02:51 AM    BUN 18 12/16/2023 02:51 AM    CR 0.50 12/16/2023 02:51 AM    CA 8.3 (L) 12/16/2023 02:51 AM    TOTPROT 5.9 (L) 12/16/2023 02:51 AM    Lab Results   Component Value Date/Time    TOTBILI 0.5 12/16/2023 02:51 AM    ALBUMIN 3.1 (L) 12/16/2023 02:51 AM    ALKPHOS 45 12/16/2023 02:51 AM    AST 10 12/16/2023 02:51 AM    CO2 27 12/16/2023 02:51 AM    ALT 7 12/16/2023 02:51 AM    GAP 8 12/16/2023 02:51 AM    EGFR1 >60 02/11/2023 11:43 AM         Plan:  -Continue radiation, EOT this Thursday.   -skin check in two weeks  - F/u with Dr. Mariane Masters in one month after EOT    Gardiner Coins, MD  Radiation Oncology Resident, PGY-5  [facility]  Pager # [phone]    ATTESTATION    I personally performed the key portions of the E/M visit, discussed case with resident and concur with resident documentation of history, physical exam, assessment, and treatment plan unless otherwise noted.    Staff name:  Jason Nest, MD Date: 12/16/2023

## 2023-12-16 NOTE — Progress Notes
 General Progress Note    Name:  Daryl Baker     Today's Date:  12/16/2023  Admission Date: 12/08/2023  LOS: 8 days                     Assessment/Plan:    Principal Problem:    Oropharyngeal mass  Active Problems:    Oropharyngeal cancer (CMS-HCC)    Severe malnutrition    Mouth pain    Drug-induced constipation      68 y.o. 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 He has had increasing pain over the last few days prior to admission near his oropharyngeal mass on the left along with the left aspect of his tongue preventing any PO intake and has a scheduled G-tube placement.    Interval Events 3/15:   -glyburide & metformin for hyperglycemia  -continue pain management with the assistance of palliative care    Sepsis  Neutropenic fever  Presumed Aspiration pneumonia  Acute metabolic encephalopathy  -tachycardia, fever, increase in WBC (leukopenic at baseline)  -source likely pulmonary given imaging findings  -UA unremarkable  -CTA Chest: Development of a few clustered nodular and patchy opacities within the   right greater than left lower lobes, probably infectious or aspiration-related.   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  >hold trulicity  >glyburide 2.5 x1 yesterday, and again today  >PTA metformin via G-tube    Dysphagia   Oropharyngeal pain  -Due to left oropharyngeal cancer  -Patient endorses inability to take anything PO including oral meds  -Feels weak and fatigued  -G-tube placed 3/12 AM  -He will need the G-tube feeds for >90 days.  Plan  >Magic mouthwash TID PRN for oropharyngeal pain  >PO PTA pain regimen converted to fentanyl patch 25 mcg, dilaudid 0.5-1mg  q4h, oxycodone solution 5-10 mg  >1.5 L IV Lactated Ringers for fluid supplementation  >IV labetalol PRN for SBP >180 as he cannot tolerate PO PTA antihypertensives  > Start tube feeds at 0906 12/11/23   > Consulted Dietician  >Transitioned to tube feeds 3/15, formal recs in dietician note.   > Palliative Care consult for pain management as above.   Continue present dose of fentanyl patch ( ) increased from 25 on 3/15.  May increase to 50 mcg either tomorrow or the next day if needed.  Oxycodone increased to 15 mg every 3 hours as needed e - required 6 doses (55mg ) in the past 24 hours.   Increase Dilaudid 0.5 to 1mg  IV Q2 hours PRN (ordered)  Continue gabapentin 300 mg 3 times daily  Continue aggressive bowel regimen and patient encouraged to increase fluid intake and ambulate in halls to promote BM as well.       Left HPV-Mediated Oropharyngeal SCC  -Currently on chemo and radiation  -Follow in Oncology clinic with dr Neita Carp and radiation Oncology Dr Mariane Masters  -CT Neck revealed:  1.  Marked decrease size of the prior large left oropharyngeal mass. There   is a  residual 1.7 x 1.5 cm lesion involving the left tonsil with central   fluid attenuation. Treated necrotic tumor is the leading consideration. A   post therapeutic retention cyst could appear similar. An abscess is   thought less likely though direct visualization and clinical correlation   is recommended regarding symptoms of infection.   2.  Improvement of bilateral cervical lymphadenopathy.   > Radiation Oncology with continue to perform radiation while inpatient.(3/17-3/19)  >Triamcinolone cream for radiation induced dermatitis    Pancytopenia   Mild Neutropenia  -ANC <1.5  -likely due to Chemo & radiation  -follow with daily CBC     Constipation  -Scheduled BID MiraLAX and Senokot Hypertension  >Continue to hold metoprolol & losartan via G-tube      FEN:  Bolus tube feeds with free water flushes   DIET CLEAR LIQUID w/ Body mass index is 31.66 kg/m?Marland Kitchen   PPX:  40mg  qday enoxaparin, SCDs bilat  Full Code     Justine Null, MD   Internal Medicine-Psychiatry Resident, PGY1  Available on Vo[employer]     Seen & discussed with Dr. Jeannette Corpus  ________________________________________________________________________    Subjective  Daryl Baker is a 68 y.o. male.  He continues to be encephalopathic this morning & is oriented x1. He had 1 bloody BM yesterday but continues to deny any GI, GU, or respiratory concerns.       Medications  Scheduled Meds:Diet Enteral Feeding Bolus, , SEE ADMIN INSTRUCTIONS, 5XDAY (6,10,14,18,22   And  Water Bolus, 100 mL, PEG Tube, 5XDAY (6,10,14,18,22  enoxaparin (LOVENOX) syringe 40 mg, 40 mg, Subcutaneous, QDAY(21)  fentaNYL (DURAGESIC) 12 mcg/hr patch 1 patch, 1 patch, Transdermal, Q72H*   And  Verification of Patch Placement and Integrity - fentaNYL 12 mcg/hr, , Transdermal, BID  fentaNYL (DURAGESIC) 25 mcg/hr patch 1 patch, 1 patch, Transdermal, Q72H*   And  Verification of Patch Placement and Integrity - fentaNYL 25 mcg/hr, , Transdermal, BID  gabapentin (NEURONTIN) capsule 300 mg, 300 mg, Per G Tube, TID  insulin aspart (U-100) (NOVOLOG FLEXPEN U-100 INSULIN) injection PEN 0-12 Units, 0-12 Units, Subcutaneous, ACHS (22)  [Held by Provider] losartan (COZAAR) tablet 100 mg, 100 mg, Per G Tube, QDAY  [Held by Provider] metoprolol succinate XL (TOPROL XL) tablet 100 mg, 100 mg, Oral, QDAY  piperacillin/tazobactam (ZOSYN) 4.5 g in sodium chloride 0.9% (NS) 100 mL IVPB (MB+)(EXTENDED INFUSION), 4.5 g, Intravenous, Q6H*  polyethylene glycol 3350 (MIRALAX) packet 17 g, 1 packet, Per G Tube, BID  sennosides-docusate sodium (SENOKOT-S) tablet 1 tablet, 1 tablet, Per G Tube, BID  triamcinolone acetonide (KENALOG) 0.1 % topical cream 1 g, 1 g, Topical, BID    Continuous Infusions:      PRN and Respiratory Meds:acetaminophen Q6H PRN, bisacodyL QDAY PRN, dextrose 50% (D50) IV PRN, diphenhydrAMINE/lidocaine/mag,al-simeth(#) TID PRN, HYDROmorphone (DILAUDID) injection Q2H PRN, labetalol (NORMODYNE; TRANDATE) injection Q6H PRN, melatonin QHS PRN, ondansetron Q6H PRN **OR** ondansetron (ZOFRAN) IV Q6H PRN, oxyCODONE Q3H PRN, pancrelipase 20,880 Units/sodium bicarbonate 650 mg ([place] CLOG DESTROYER) PRN (On Call from Rx), sodium chloride PRN        Objective:                          Vital Signs: Last Filed                 Vital Signs: 24 Hour Range   BP: 137/64 (03/18 0400)  Temp: 36.3 ?C (97.3 ?F) (03/18 0400)  Pulse: 109 (03/18  0400)  Respirations: 16 PER MINUTE (03/18 0400)  SpO2: 95 % (03/18 0400)  O2 Device: None (Room air) (03/18 0400)  O2 Liter Flow: 2 Lpm (03/17 1254) BP: (124-139)/(50-64)   Temp:  [36.3 ?C (97.3 ?F)-37 ?C (98.6 ?F)]   Pulse:  [102-119]   Respirations:  [16 PER MINUTE-18 PER MINUTE]   SpO2:  [93 %-98 %]   O2 Device: None (Room air)  O2 Liter Flow: 2 Lpm   Intensity Pain Scale (Self Report): 10 (12/16/23 0400) Vitals:    12/08/23 0907 12/08/23 1445 12/09/23 1715   Weight: 102 kg (224 lb 13.9 oz) 101.5 kg (223 lb 12.8 oz) 101.5 kg (223 lb 12.8 oz)       Intake/Output Summary:  (Last 24 hours)    Intake/Output Summary (Last 24 hours) at 12/16/2023 0708  Last data filed at 12/16/2023 0309  Gross per 24 hour   Intake 1311 ml   Output 270 ml   Net 1041 ml           Physical Exam   General: well develop. Visibly upset on exam.    Skin: no rash or wounds, normal capillary refill  HEENT: erythematous lesion on left side of tongue consistent with mucositis  Neck: supple  Heart: S1 S2, no murmur  Resp: No resp distress   Abd: soft, NT, ND, G tube in place, no leakage around insertion site.   Musculoskeletal: full ROM  Neuro: alert and oriented x1, MAE, speech intact       Point of Care Testing  (Last 24 hours)  Glucose: (!) 164 (12/16/23 0251)  POC Glucose (Download): (!) 184 (12/16/23 4132)    Radiology and other Diagnostics Review:    CT NECK W/CONTRAST  Result Date: 12/08/2023  1.  Marked decrease size of the prior large left oropharyngeal mass. There is a residual 1.7 x 1.5 cm lesion involving the left tonsil with central fluid attenuation. Treated necrotic tumor is the leading consideration. A post therapeutic retention cyst could appear similar. An abscess is thought less likely though direct visualization and clinical correlation is recommended regarding symptoms of infection. 2.  Improvement of bilateral cervical lymphadenopathy. By my electronic signature, I attest that I have personally reviewed the images for this examination and formulated the interpretations and opinions expressed in this report  Finalized by Marily Memos, M.D. on 12/08/2023 12:00 PM. Dictated by Jerene Pitch, MD on 12/08/2023 11:07 AM.    CT HEAD WO CONTRAST  Result Date: 12/08/2023  1.  No acute intracranial hemorrhage or mass effect. 2.  Mild patchy cerebral white matter hypodensities, likely sequelae of chronic microvascular ischemia. 3.  The CT neck is dictated separately. Please see that report. By my electronic signature, I attest that I have personally reviewed the images for this examination and formulated the interpretations and opinions expressed in this report  Finalized by Marily Memos, M.D. on 12/08/2023 12:00 PM. Dictated by Jerene Pitch, MD on 12/08/2023 10:51 AM.

## 2023-12-17 ENCOUNTER — Ambulatory Visit: Admit: 2023-12-17 | Discharge: 2023-12-17 | Payer: MEDICARE

## 2023-12-17 ENCOUNTER — Encounter: Admit: 2023-12-17 | Discharge: 2023-12-17 | Payer: MEDICARE

## 2023-12-17 LAB — MANUAL DIFF
~~LOC~~ BKR BASOPHILS - RELATIVE: 2 % (ref 0–2)
~~LOC~~ BKR LYMPHOCYTES - RELATIVE: 9 % — ABNORMAL LOW (ref 24–44)
~~LOC~~ BKR METAMYELOCYTES - RELATIVE: 2 %
~~LOC~~ BKR MONOCYTES - RELATIVE: 19 % — ABNORMAL HIGH (ref 4–12)
~~LOC~~ BKR MYELOCYTES - RELATIVE: 1 %
~~LOC~~ BKR NEUT+BANDS - ABSOLUTE: 0.7 10*3/uL — ABNORMAL LOW (ref 1.8–7.0)
~~LOC~~ BKR NEUTROPHILS - RELATIVE: 67 % (ref 41–77)

## 2023-12-17 LAB — COMPREHENSIVE METABOLIC PANEL
~~LOC~~ BKR ALBUMIN: 3.1 g/dL — ABNORMAL LOW (ref 3.5–5.0)
~~LOC~~ BKR ALK PHOSPHATASE: 43 U/L — ABNORMAL HIGH (ref 25–110)
~~LOC~~ BKR ALT: 13 U/L — ABNORMAL LOW (ref 7–56)
~~LOC~~ BKR AST: 15 U/L — ABNORMAL HIGH (ref 7–40)
~~LOC~~ BKR CALCIUM: 8.3 mg/dL — ABNORMAL LOW (ref 8.5–10.6)
~~LOC~~ BKR TOTAL BILIRUBIN: 0.8 mg/dL — ABNORMAL HIGH (ref 0.2–1.3)
~~LOC~~ BKR TOTAL PROTEIN: 5.9 g/dL — ABNORMAL LOW (ref 6.0–8.0)

## 2023-12-17 LAB — CBC AND DIFF
~~LOC~~ BKR HEMATOCRIT: 23 % — ABNORMAL LOW (ref 40.0–50.0)
~~LOC~~ BKR HEMOGLOBIN: 8.1 g/dL — ABNORMAL LOW (ref 13.5–16.5)
~~LOC~~ BKR MCH: 32 pg (ref 26.0–34.0)
~~LOC~~ BKR MCHC: 34 g/dL — ABNORMAL HIGH (ref 32.0–36.0)
~~LOC~~ BKR MCV: 95 fL — ABNORMAL LOW (ref 80.0–100.0)
~~LOC~~ BKR PLATELET COUNT: 203 10*3/uL (ref 150–400)
~~LOC~~ BKR RBC COUNT: 2.4 10*6/uL — ABNORMAL LOW (ref 4.40–5.50)
~~LOC~~ BKR RDW: 16 % — ABNORMAL HIGH (ref 11.0–15.0)
~~LOC~~ BKR WBC COUNT: 1.1 10*3/uL — ABNORMAL LOW (ref 4.50–11.00)

## 2023-12-17 LAB — POC GLUCOSE
~~LOC~~ BKR POC GLUCOSE: 213 mg/dL — ABNORMAL HIGH (ref 70–100)
~~LOC~~ BKR POC GLUCOSE: 272 mg/dL — ABNORMAL HIGH (ref 70–100)
~~LOC~~ BKR POC GLUCOSE: 204 mg/dL — ABNORMAL HIGH (ref 70–100)
~~LOC~~ BKR POC GLUCOSE: 224 mg/dL — ABNORMAL HIGH (ref 70–100)

## 2023-12-17 LAB — HEMOGLOBIN A1C: ~~LOC~~ BKR HEMOGLOBIN A1C: 7.6 % — ABNORMAL HIGH (ref 4.0–5.7)

## 2023-12-17 MED ORDER — LORAZEPAM 2 MG/ML IJ SOLN GROUP
1 mg | Freq: Once | INTRAVENOUS | 0 refills | Status: CP
Start: 2023-12-17 — End: ?
  Administered 2023-12-18: 05:00:00 1 mg via INTRAVENOUS

## 2023-12-17 MED ORDER — GLYBURIDE 1.25 MG PO TAB
2.5 mg | Freq: Every day | ORAL | 0 refills | Status: DC
Start: 2023-12-17 — End: 2023-12-22
  Administered 2023-12-18 – 2023-12-21 (×4): 2.5 mg via ORAL

## 2023-12-17 MED ORDER — MELATONIN 5 MG PO TAB
5 mg | Freq: Every evening | ORAL | 0 refills | Status: DC
Start: 2023-12-17 — End: 2023-12-18
  Administered 2023-12-17: 5 mg via ORAL

## 2023-12-17 NOTE — Progress Notes
 PALLIATIVE CARE INPATIENT NOTE     Name: Daryl Baker            MRN: [id number]                DOB: [birth year]          Age: 68 y.o.  Admission Date: 12/08/2023             LOS: 9 days    ASSESSMENT/PLAN   Daryl Baker is a 68 y.o. male with with a past medical history of hypertension, diabetes type 2, left-sided HPV mediated oropharyngeal cancer undergoing radiation therapy who presented to [facility] on 12/08/2023 for dysphagia, oropharyngeal pain and inability to tolerate p.o. intake. The palliative care team was consulted for assistance with symptom management for radiation induced mucositis.    # Pain, secondary to malignancy  # Oropharyngeal carcinoma, left, HPV mediated  # Dysphagia  # Poor p.o. intake    Discussion: Visited with [name] this afternoon. He acknowledges that he has been intermittently confused. He is not able to remember what he has been hearing from the teams. Talked about him refusing certain medications and he says, I am not able to expand on that right now.    RECOMMENDATIONS:   Continue fentanyl patch 13mcg/hr (increased 3/18)   Continue Oxycodone 15 mg Q3H PRN - (used 2 doses in past 24 hours)  Continue Dilaudid 0.5 to 1mg  IV Q2 hours PRN - (used 4 doses in past 24 hours)  Continue gabapentin 300 mg 3 times daily  Continue senna 2 tab BID, continue miralax 1 packet twice daily.     PALLIATIVE CARE PLANNING     Advance Care Planning:   Identified Health Care Decision Maker:    DPOA - Would benefit from completion of DPOA  TPOPP - Not discussed    PC Clinic - NA    Medication safety - Advise a Narcan prescription on discharge    Disposition planning: Pending       SUBJECTIVE     CC/Reason for Visit: Symptoms (pain)    Interval HPI: Patient continues to have mouth/throat pain. Pain is currently 20/10. He says when he gets the oxycodone or dilaudid it reduces his pain to a tolerable level. Not able to tell much difference yet from fentanyl patch increase. I reminded him that he has not used much of his PRN pain medication however he is more confused today and not able to keep track of when he can have pain medications. I spoke with his nurse Jerrel Ivory to ensure nursing is checking in on his pain every couple of hours. Patient reports that he has not had a BM however multiple BMs charted overnight by nursing.     ROS: Review of Systems   Constitutional:  Positive for malaise/fatigue.   HENT:          Severe mouth pain.         OBJECTIVE     Blood pressure 126/54, pulse 83, temperature 36.6 ?C (97.8 ?F), height 179.1 cm (5' 10.5), weight 101.5 kg (223 lb 12.8 oz), SpO2 94%.  Physical Exam  Constitutional:       Comments: Uncomfortable appearing male, sitting up in in bed, restless secondary to pain.   HENT:      Head: Normocephalic.   Eyes:      Extraocular Movements: Extraocular movements intact.   Cardiovascular:      Rate and Rhythm: Normal rate.   Pulmonary:  Effort: Pulmonary effort is normal.   Abdominal:      General: There is distension.      Palpations: Abdomen is soft.      Tenderness: There is no abdominal tenderness.   Musculoskeletal:         General: Normal range of motion.      Cervical back: Normal range of motion.   Skin:     General: Skin is warm and dry.      Coloration: Skin is pale.   Neurological:      Mental Status: He is alert. He is confused.   Psychiatric:         Attention and Perception: Attention normal.         Behavior: Behavior is cooperative.       Lab Results:  CBC   Lab Results   Component Value Date/Time    WBC 1.10 (L) 12/17/2023 04:44 AM    HGB 8.1 (L) 12/17/2023 04:44 AM    PLTCT 203 12/17/2023 04:44 AM     Lab Results   Component Value Date/Time    NEUT 72.1 12/16/2023 02:51 AM    ANC 0.7 (L) 12/17/2023 04:44 AM      Chemistries   Lab Results   Component Value Date/Time    NA 139 12/17/2023 04:44 AM    K 4.5 12/17/2023 04:44 AM    BUN 18 12/17/2023 04:44 AM    CR 0.56 12/17/2023 04:44 AM    GLU 177 (H) 12/17/2023 04:44 AM     Lab Results   Component Value Date/Time    CA 8.3 (L) 12/17/2023 04:44 AM    PO4 3.9 12/15/2023 04:27 AM    ALBUMIN 3.1 (L) 12/17/2023 04:44 AM    TOTPROT 5.9 (L) 12/17/2023 04:44 AM    ALKPHOS 43 12/17/2023 04:44 AM    AST 15 12/17/2023 04:44 AM    ALT 13 12/17/2023 04:44 AM    TOTBILI 0.8 12/17/2023 04:44 AM    GFR >60 12/17/2023 04:44 AM        Other Pertinent Diagnostic Results:   NA    Discussed w/ PC Attending Dr. Rosita Kea medical decision making due to the following:  1 acute or chronic illness that poses a threat to life or bodily function  Drug therapy requiring intensive monitoring for toxicity  IV controlled substances    Rachael Fee, [facility]  Palliative Medicine Nurse Practitioner  Available on [facility]   Pager [phone]

## 2023-12-17 NOTE — Progress Notes
 General Progress Note    Name:  Daryl Baker     Today's Date:  12/17/2023  Admission Date: 12/08/2023  LOS: 9 days                     Assessment/Plan:    Principal Problem:    Oropharyngeal mass  Active Problems:    Oropharyngeal cancer (CMS-HCC)    Severe malnutrition    Mouth pain    Drug-induced constipation    Encephalopathy acute      68 y.o. 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 He has had increasing pain over the last few days prior to admission near his oropharyngeal mass on the left along with the left aspect of his tongue preventing any PO intake and has a scheduled G-tube placement.    Interval Events 3/15:   -Glyburide 2.5 mg qay & metformin 500 BID scheduled for hyperglycemia  -Continue pain management with the assistance of palliative care:    >fentanyl patch increased to 75 mcg/h   -MRI brain to rule out alternative reasons for encephalopathy: metastatic, PRES, etc.    Acute metabolic encephalopathy  -Blood sugar  = normal/ elevated  -Na = wnl, Ca = 8.3  -3/17 venous pH = 7.42  -TSH = 0.39  -Hx of cisplatin 5 cycles, which can cause encephalopathy/PRES  -currently denying headache, has been stooling regularly  -Had a course of thiamine during admission  >Obtain MRI brain    Sepsis  Neutropenic fever  Presumed Aspiration pneumonia  -tachycardia, fever, increase in WBC (leukopenic at baseline)  -source likely pulmonary given imaging findings  -UA unremarkable  -CTA Chest: Development of a few clustered nodular and patchy opacities within the   right greater than left lower lobes, probably infectious or aspiration-related.   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  >hold trulicity  >glyburide 2.5 mg qay & metformin 500 BID for hyperglycemia via G-tube    Dysphagia   Oropharyngeal pain  -Due to left oropharyngeal cancer  -Patient endorses inability to take anything PO including oral meds  -Feels weak and fatigued  -G-tube placed 3/12 AM  -He will need the G-tube feeds for >90 days.  Plan  >Magic mouthwash TID PRN for oropharyngeal pain  >PO PTA pain regimen converted to fentanyl patch 25 mcg, dilaudid 0.5-1mg  q4h, oxycodone solution 5-10 mg  >1.5 L IV Lactated Ringers for fluid supplementation  >IV labetalol PRN for SBP >180 as he cannot tolerate PO PTA antihypertensives  > Start tube feeds at 0906 12/11/23   > Consulted Dietician  >Transitioned to tube feeds 3/15, formal recs in dietician note.   > Palliative Care consult for pain management as above.   > increased his fentanyl patch to 75 mcg/h.  2.  We talked with Daryl Baker about remembering to take his oxycodone 50 mg p.o. every 3 hours as needed.  We also reminded him that he has Dilaudid 0.5 to 1 mg IV every 2 hours as needed if needed  for severe pain.  > continue his gabapentin 300 mg p.o. 3 times daily.  4.  He still is not having bowel movements.  We increased his senna to 2 tablets twice daily and continue MiraLAX 1 packet twice daily.     Left HPV-Mediated Oropharyngeal SCC  -Currently on chemo and radiation  -Follow in Oncology clinic with dr Neita Carp and radiation Oncology Dr Mariane Masters  -CT Neck revealed:  1.  Marked decrease size of the prior large left oropharyngeal mass. There   is a residual 1.7 x 1.5 cm lesion involving the left tonsil with central   fluid attenuation. Treated necrotic tumor is the leading consideration. A   post therapeutic retention cyst could appear similar. An abscess is   thought less likely though direct visualization and clinical correlation   is recommended regarding symptoms of infection.   2.  Improvement of bilateral cervical lymphadenopathy.   > Radiation Oncology with continue to perform radiation while inpatient.(3/17-3/19)  > Triamcinolone cream for radiation induced dermatitis    Pancytopenia   Mild Neutropenia  -ANC <1.5  -likely due to Chemo & radiation  -follow with daily CBC     Constipation  -Scheduled BID MiraLAX and Senokot     Hypertension  >Continue to hold metoprolol & losartan via G-tube      FEN:  Bolus tube feeds with free water flushes   DIET CLEAR LIQUID w/ Body mass index is 31.66 kg/m?Marland Kitchen   PPX:  40mg  qday enoxaparin, SCDs bilat  Full Code     Daryl Null, MD   Internal Medicine-Psychiatry Resident, PGY1  Available on Voal[employer]     Seen & discussed with Dr. Jeannette Corpus  ________________________________________________________________________    Subjective  Daryl Baker is a 68 y.o. male.  He continues to be encephalopathic this morning & is oriented x1. After discussion with his wife, she reports that he was becoming progressively more confused the week leading up to his admission, which she attributed to the level of pain & inability to sleep and reports that his mentation has been fluctuating since but appears to be worsening. She is very concerned about her ability to care for him as she is already a caregiver for their son with Becker's Muscular Dystrophy.       Medications  Scheduled Meds:Diet Enteral Feeding Bolus, , SEE ADMIN INSTRUCTIONS, 5XDAY (6,10,14,18,22   And  Water Bolus, 100 mL, PEG Tube, 5XDAY (6,10,14,18,22  enoxaparin (LOVENOX) syringe 40 mg, 40 mg, Subcutaneous, QDAY(21)  fentaNYL (DURAGESIC) 75 mcg/hr patch 1 patch, 1 patch, Transdermal, Q72H*   And  Verification of Patch Placement and Integrity - fentaNYL 75 mcg/hr, , Transdermal, BID  gabapentin (NEURONTIN) capsule 300 mg, 300 mg, Per G Tube, TID  glyBURIDE (DIABETA) tablet 2.5 mg, 2.5 mg, Oral, QDAY  insulin aspart (U-100) (NOVOLOG FLEXPEN U-100 INSULIN) injection PEN 0-12 Units, 0-12 Units, Subcutaneous, ACHS (22)  [Held by Provider] losartan (COZAAR) tablet 100 mg, 100 mg, Per G Tube, QDAY  metFORMIN (GLUCOPHAGE) tablet 500 mg, 500 mg, Oral, BID w/meals  [Held by Provider] metoprolol succinate XL (TOPROL XL) tablet 100 mg, 100 mg, Oral, QDAY  piperacillin/tazobactam (ZOSYN) 4.5 g in sodium chloride 0.9% (NS) 100 mL IVPB (MB+)(EXTENDED INFUSION), 4.5 g, Intravenous, Q6H*  polyethylene glycol 3350 (MIRALAX) packet 17 g, 1 packet, Per G Tube, BID  sennosides-docusate sodium (SENOKOT-S) tablet 2 tablet, 2 tablet, Per G Tube, BID  triamcinolone acetonide (KENALOG) 0.1 % topical cream 1 g, 1 g, Topical, BID    Continuous  Infusions:      PRN and Respiratory Meds:acetaminophen Q6H PRN, bisacodyL QDAY PRN, dextrose 50% (D50) IV PRN, diphenhydrAMINE/lidocaine/mag,al-simeth(#) TID PRN, HYDROmorphone (DILAUDID) injection Q2H PRN, labetalol (NORMODYNE; TRANDATE) injection Q6H PRN, melatonin QHS PRN, ondansetron Q6H PRN **OR** ondansetron (ZOFRAN) IV Q6H PRN, oxyCODONE Q3H PRN, pancrelipase 20,880 Units/sodium bicarbonate 650 mg ([place] CLOG DESTROYER) PRN (On Call from Rx), sodium chloride PRN        Objective:                          Vital Signs: Last Filed                 Vital Signs: 24 Hour Range   BP: 155/85 (03/19 0331)  Temp: 37.1 ?C (98.8 ?F) (03/19 8469)  Pulse: 133 (03/19 0331)  Respirations: 18 PER MINUTE (03/19 0331)  SpO2: 97 % (03/19 0331)  O2 Device: None (Room air) (03/19 0331) BP: (115-155)/(50-85)   Temp:  [36.2 ?C (97.1 ?F)-37.1 ?C (98.8 ?F)]   Pulse:  [89-133]   Respirations:  [16 PER MINUTE-18 PER MINUTE]   SpO2:  [96 %-98 %]   O2 Device: None (Room air)   Intensity Pain Scale (Self Report): 10 (12/17/23 6295) Vitals:    12/08/23 0907 12/08/23 1445 12/09/23 1715   Weight: 102 kg (224 lb 13.9 oz) 101.5 kg (223 lb 12.8 oz) 101.5 kg (223 lb 12.8 oz)       Intake/Output Summary:  (Last 24 hours)    Intake/Output Summary (Last 24 hours) at 12/17/2023 0752  Last data filed at 12/17/2023 2841  Gross per 24 hour   Intake 1037 ml   Output 1080 ml   Net -43 ml           Physical Exam   General: well develop. Visibly upset on exam.    Skin: no rash or wounds, normal capillary refill  HEENT: erythematous lesion on left side of tongue consistent with mucositis  Neck: supple  Heart: S1 S2, no murmur  Resp: No resp distress   Abd: soft, NT, ND, G tube in place, no leakage around insertion site.   Musculoskeletal: full ROM  Neuro: alert and oriented x1, MAE, speech intact       Point of Care Testing  (Last 24 hours)  Glucose: (!) 177 (12/17/23 0444)  POC Glucose (Download): (!) 213 (12/17/23 0548)    Radiology and other Diagnostics Review:    CT NECK W/CONTRAST  Result Date: 12/08/2023  1.  Marked decrease size of the prior large left oropharyngeal mass. There is a residual 1.7 x 1.5 cm lesion involving the left tonsil with central fluid attenuation. Treated necrotic tumor is the leading consideration. A post therapeutic retention cyst could appear similar. An abscess is thought less likely though direct visualization and clinical correlation is recommended regarding symptoms of infection. 2.  Improvement of bilateral cervical lymphadenopathy. By my electronic signature, I attest that I have personally reviewed the images for this examination and formulated the interpretations and opinions expressed in this report  Finalized by Marily Memos, M.D. on 12/08/2023 12:00 PM. Dictated by Jerene Pitch, MD on 12/08/2023 11:07 AM.    CT HEAD WO CONTRAST  Result Date: 12/08/2023  1.  No acute intracranial hemorrhage or mass effect. 2.  Mild patchy cerebral white matter hypodensities, likely sequelae of chronic microvascular ischemia. 3.  The CT neck is dictated separately. Please see that report. By my electronic signature, I attest that I have  personally reviewed the images for this examination and formulated the interpretations and opinions expressed in this report Finalized by Marily Memos, M.D. on 12/08/2023 12:00 PM. Dictated by Jerene Pitch, MD on 12/08/2023 10:51 AM.

## 2023-12-17 NOTE — Consults
 Wound Ostomy Note    NAME:Daryl Baker                                                                   MRN: [id number]                 DOB:[birth year]          AGE: 68 y.o.  ADMISSION DATE: 12/08/2023             DAYS ADMITTED: LOS: 9 days      Reason for Consult/Visit:   pressure injury Stage II or greater    Consulted for a left buttock wound.      Principal Problem:    Oropharyngeal mass  Active Problems:    Oropharyngeal cancer (CMS-HCC)    Severe malnutrition    Mouth pain    Drug-induced constipation    RISK FACTORS: incontinence, limited mobility, malnutrition, and altered mental status    Assessment:   Small, open wound to the left buttock that is moist and red. Surrounding skin with erythema, is blanchable and warm. Does not appear fungal related. No drainage noted. Patient currently in brief.     Plan of Care: orders placed per skin integrity protocol  Buttocks-  Keep area clean.   Apply thin layer of barrier cream BID and PRN. Do not lather as this traps moisture. Cream should be clear after applying.   Encourage turns and OOB activity if applicable.       Next steps: Our service will sign off at this time. Please place new consult if affected area does not improve despite consistent implementation of recommendations listed above. Thank you.    Wounds Pressure injury Left;Mid Buttocks (Active)   12/17/23 0900   Wound Type: Pressure injury   Orientation: Left;Mid   Location: Buttocks   Wound Location Comments:    Initial Wound Site Closure:    Initial Dressing Placed:    Initial Cycle:    Initial Suction Setting (mmHg):    Pressure Injury Stages: Stage 2   Pressure Injury Present Within 24 Hours of Hospital Admission: No   If This Pressure Injury Is Suspected to Be Device Related, Please Select the Device::    Is the Wound Open or Closed:    Image   12/17/23 1410   Wound Assessment Moist;Red 12/17/23 1410   Peri-wound Assessment Red;Warm 12/17/23 1410   Wound Drainage Amount None 12/17/23 1410 Wound Dressing Status None/open to air 12/17/23 1410   Wound Care     Wound Dressing and/or Treatment     Number of days: 0     Guido Sander, Charity fundraiser, BSN, [employer]  Wound/Ostomy Nursing Consult Service  Available via [place] text when in house.   For questions after 3:30pm M-F/weekends/holidays, voalte First Contact

## 2023-12-17 NOTE — Progress Notes
 Nani Ravens.received radiation therapy treatment today.  32 of 33 treatments.

## 2023-12-17 NOTE — Progress Notes
 Didn't mean to delete the last note:    Daryl Baker has refused 2000 and 0000 vitals, stating to the CO you work for my [employer]. Then, the patient refused his 2200 blood sugar checks, therefore, the feedings are being held for tonight. The patient then refused the PO tylenol because I won't be taking that. The patient refused enoxaparin, gabapentin, melatonin, senna, and kenalog. Pt said I don't want to be poked (lovenox) I don't think I need the sleep med =, it'll all come out anyway (melatonin). I convinced the patient to take the miralax as its mixed with water on his night stand, but did not want to take any pills (senna and gabapentin.) Then, did not want me to put on the kenalog.   The aspart insulin was held due to no blood sugar being taken.

## 2023-12-18 ENCOUNTER — Ambulatory Visit: Admit: 2023-12-18 | Discharge: 2023-12-18 | Payer: MEDICARE

## 2023-12-18 ENCOUNTER — Encounter: Admit: 2023-12-18 | Discharge: 2023-12-18 | Payer: MEDICARE

## 2023-12-18 LAB — COMPREHENSIVE METABOLIC PANEL
~~LOC~~ BKR ALBUMIN: 2.8 g/dL — ABNORMAL LOW (ref 3.5–5.0)
~~LOC~~ BKR ANION GAP: 8 10*3/uL — ABNORMAL LOW (ref 3–12)
~~LOC~~ BKR BLD UREA NITROGEN: 16 mg/dL — ABNORMAL LOW (ref 7–25)
~~LOC~~ BKR CALCIUM: 8.1 mg/dL — ABNORMAL LOW (ref 8.5–10.6)
~~LOC~~ BKR CHLORIDE: 103 mmol/L — ABNORMAL LOW (ref 98–110)
~~LOC~~ BKR CREATININE: 0.5 mg/dL — ABNORMAL HIGH (ref 0.40–1.24)
~~LOC~~ BKR GLOMERULAR FILTRATION RATE (GFR): >60 mL/min — ABNORMAL HIGH (ref >60–0.80)
~~LOC~~ BKR GLUCOSE, RANDOM: 137 mg/dL — ABNORMAL HIGH (ref 70–100)
~~LOC~~ BKR POTASSIUM: 4.3 mmol/L — ABNORMAL LOW (ref 3.5–5.1)
~~LOC~~ BKR SODIUM, SERUM: 139 mmol/L — ABNORMAL LOW (ref 137–147)
~~LOC~~ BKR TOTAL BILIRUBIN: 0.5 mg/dL — ABNORMAL HIGH (ref 0.2–1.3)
~~LOC~~ BKR TOTAL PROTEIN: 5.3 g/dL — ABNORMAL LOW (ref 6.0–8.0)

## 2023-12-18 LAB — MAGNESIUM: ~~LOC~~ BKR MAGNESIUM: 1.8 mg/dL — ABNORMAL LOW (ref 1.6–2.6)

## 2023-12-18 LAB — CBC AND DIFF
~~LOC~~ BKR ABSOLUTE BASO COUNT: 0 10*3/uL — ABNORMAL LOW (ref 0.00–0.20)
~~LOC~~ BKR ABSOLUTE EOS COUNT: 0 10*3/uL — ABNORMAL LOW (ref 0.00–0.45)
~~LOC~~ BKR ABSOLUTE NEUTROPHIL: 0.6 10*3/uL — ABNORMAL LOW (ref 1.80–7.00)
~~LOC~~ BKR BASOPHILS %: 0.3 % — ABNORMAL LOW (ref 0.0–2.0)
~~LOC~~ BKR EOSINOPHILS %: 0.5 % — ABNORMAL LOW (ref 0.0–5.0)
~~LOC~~ BKR MONOCYTES %: 23 % — ABNORMAL HIGH (ref 4.0–12.0)
~~LOC~~ BKR RBC COUNT: 2.3 10*6/uL — ABNORMAL LOW (ref 4.40–5.50)
~~LOC~~ BKR WBC COUNT: 1 10*3/uL — ABNORMAL LOW (ref 4.50–11.00)

## 2023-12-18 LAB — POC GLUCOSE
~~LOC~~ BKR POC GLUCOSE: 163 mg/dL — ABNORMAL HIGH (ref 70–100)
~~LOC~~ BKR POC GLUCOSE: 239 mg/dL — ABNORMAL HIGH (ref 70–100)
~~LOC~~ BKR POC GLUCOSE: 189 mg/dL — ABNORMAL HIGH (ref 70–100)
~~LOC~~ BKR POC GLUCOSE: 228 mg/dL — ABNORMAL HIGH (ref 70–100)

## 2023-12-18 MED ORDER — MELATONIN 5 MG PO TAB
10 mg | Freq: Every evening | ORAL | 0 refills | Status: DC
Start: 2023-12-18 — End: 2024-01-02
  Administered 2023-12-19 – 2024-01-02 (×15): 10 mg via ORAL

## 2023-12-18 MED ORDER — TRAZODONE 50 MG PO TAB
25 mg | Freq: Every evening | ORAL | 0 refills | Status: DC
Start: 2023-12-18 — End: 2023-12-20
  Administered 2023-12-19 – 2023-12-20 (×2): 25 mg via ORAL

## 2023-12-18 MED ORDER — THIAMINE HCL (VITAMIN B1) 100 MG/ML IJ SOLN
500 mg | INTRAVENOUS | 0 refills | Status: CP
Start: 2023-12-18 — End: ?
  Administered 2023-12-18 – 2023-12-25 (×21): 500 mg via INTRAVENOUS

## 2023-12-18 NOTE — Procedures
 INPATIENT >1 HOUR EEG REPORT    Daryl Baker  1956-04-08  [id number]    Date of service: 12/18/23    History: This is a 68 y.o. male presenting with acute encephalopathy     Pertinent medications:  Gabapentin    Introduction:  This study was performed using digital electroencephalographic recording equipment. International 10-20 electrode placement was used along with FT9 and FT10 electrodes. The record was obtained with the patient in the awake and drowsy states.  Photic stimulation is performed.  Hyperventilation is not performed.  The study is performed from 13:22 to 14:29 on 12/18/2023.    Description:   The posterior dominant rhythm is 7 Hz.  The EEG is further described as containing continuous generalized theta > delta activity     No organized sleep structures are seen.    Technical interpretation:   This EEG is abnormal due to generalized slowing.      Clinical correlation:   This abnormal >1 hour EEG is indicative of a mild encephalopathy. No epileptiform activity or lateralizing signs are seen.    Pincus Large, DO   Clinical Neurophysiology Fellow

## 2023-12-18 NOTE — Progress Notes
 General Progress Note    Name:  Daryl Baker     Today's Date:  12/18/2023  Admission Date: 12/08/2023  LOS: 10 days                     Assessment/Plan:    Principal Problem:    Oropharyngeal mass  Active Problems:    Oropharyngeal cancer (CMS-HCC)    Severe malnutrition    Mouth pain    Drug-induced constipation    Encephalopathy acute      68 y.o. 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 He has had increasing pain over the last few days prior to admission near his oropharyngeal mass on the left along with the left aspect of his tongue preventing any PO intake and has a scheduled G-tube placement.    Acute metabolic encephalopathy  -Blood sugar  = normal/ elevated  -Na = wnl, Ca = 8.3  -3/17 venous pH = 7.42  -TSH = 0.39  -Hx of cisplatin 5 cycles, which can cause encephalopathy/PRES  -currently denying headache, has been stooling regularly  -Had a course of thiamine during admission  Plan:  > EEG, MRI brain, high dose thiamine ordered  > Scheduled melatonin & trazodone, continue delirium precautions    Neutropenic fever  Presumed Aspiration pneumonia  Sepsis- resolved  -tachycardia, fever, increase in WBC (leukopenic at baseline)  -source likely pulmonary given imaging findings  -UA unremarkable  -CTA Chest: Development of a few clustered nodular and patchy opacities within the   right greater than left lower lobes, probably infectious or aspiration-related.   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  Plan:  > Trulicity nonformulary, will hold inpatient  > glyburide 2.5 mg daily & metformin 500 BID for hyperglycemia via G-tube    Dysphagia   Oropharyngeal pain  -Due to left oropharyngeal cancer  -G-tube placed 3/12 AM  -He will need the G-tube feeds for >90 days.  Plan:  >Magic mouthwash TID PRN for oropharyngeal pain  > Consulted Dietician  >Transitioned to tube feeds 3/15, formal recs in dietician note  > Palliative Care consult for pain management as above.   -Fentanyl patch 10mcg/hr  -Oxycodone 15mg  q4h PRN  -Dilaudid 0.5-1mg  IV q2h PRN  -Gabapentin 300mg  TID  -Bowel regimen with senokot-s 2ts BID, miralax BID     Left HPV-Mediated Oropharyngeal SCC  -Currently on chemo and radiation  -Follow in Oncology clinic with dr Neita Carp and radiation Oncology Dr Mariane Masters  -CT Neck revealed:  1.  Marked decrease size of the prior large left oropharyngeal mass. There   is a residual 1.7 x 1.5 cm lesion involving the left tonsil with central   fluid attenuation. Treated necrotic tumor is the leading consideration. A   post therapeutic retention cyst could appear similar. An abscess is   thought less likely though direct visualization and clinical correlation   is recommended regarding symptoms of infection.   2.  Improvement of bilateral cervical lymphadenopathy.   Plan:  > Radiation Oncology with continue to perform radiation while inpatient.(3/17-3/19)  >  Triamcinolone cream for radiation induced dermatitis    Pancytopenia   Mild Neutropenia  -ANC <1.5  -likely due to Chemo & radiation  -follow with daily CBC     Hypertension  >Continue to hold metoprolol & losartan    FEN:  Bolus tube feeds with free water flushes/Replace electrolytes as needed/Tube feeds  DIET CLEAR LIQUID w/ Body mass index is 31.66 kg/m?Marland Kitchen   DVT PPX:  40mg  qday enoxaparin, SCDs bilat  Code status: Full Code     Patient seen and case discussed with Dr. Jacquenette Shone, DO  Internal Medicine Resident PGY3  Available on Voalte  ________________________________________________________________________    Subjective  Patient seen and examined this morning at bedside. Remains encephalopathic, citing 9/10 pain. Unable to get MRI overnight due to patient trying to crawl off the bed. Patient does not remember this.     Medications  Scheduled Meds:Diet Enteral Feeding Bolus, , SEE ADMIN INSTRUCTIONS, 5XDAY (6,10,14,18,22   And  Water Bolus, 100 mL, PEG Tube, 5XDAY (6,10,14,18,22  enoxaparin (LOVENOX) syringe 40 mg, 40 mg, Subcutaneous, QDAY(21)  fentaNYL (DURAGESIC) 75 mcg/hr patch 1 patch, 1 patch, Transdermal, Q72H*   And  Verification of Patch Placement and Integrity - fentaNYL 75 mcg/hr, , Transdermal, BID  gabapentin (NEURONTIN) capsule 300 mg, 300 mg, Per G Tube, TID  glyBURIDE (DIABETA) tablet 2.5 mg, 2.5 mg, Oral, QDAY  insulin aspart (U-100) (NOVOLOG FLEXPEN U-100 INSULIN) injection PEN 0-12 Units, 0-12 Units, Subcutaneous, ACHS (22)  [Held by Provider] losartan (COZAAR) tablet 100 mg, 100 mg, Per G Tube, QDAY  melatonin tablet 5 mg, 5 mg, Oral, QHS  metFORMIN (GLUCOPHAGE) tablet 500 mg, 500 mg, Oral, BID w/meals  [Held by Provider] metoprolol succinate XL (TOPROL XL) tablet 100 mg, 100 mg, Oral, QDAY  piperacillin/tazobactam (ZOSYN) 4.5 g in sodium chloride 0.9% (NS) 100 mL IVPB (MB+)(EXTENDED INFUSION), 4.5 g, Intravenous, Q6H*  polyethylene glycol 3350 (MIRALAX) packet 17 g, 1 packet, Per G Tube, BID  sennosides-docusate sodium (SENOKOT-S) tablet 2 tablet, 2 tablet, Per G Tube, BID  thiamine hcl (vitamin B1) injection 500 mg, 500 mg, Intravenous, Q8H  triamcinolone acetonide (KENALOG) 0.1 % topical cream 1 g, 1 g, Topical, BID    Continuous Infusions:      PRN and Respiratory Meds:acetaminophen Q6H PRN, bisacodyL QDAY PRN, dextrose 50% (D50) IV PRN, diphenhydrAMINE/lidocaine/mag,al-simeth(#) TID PRN, HYDROmorphone (DILAUDID) injection Q2H PRN, labetalol (NORMODYNE; TRANDATE) injection Q6H PRN, ondansetron Q6H PRN **OR** ondansetron (ZOFRAN) IV Q6H PRN, oxyCODONE Q3H PRN, pancrelipase 20,880 Units/sodium bicarbonate 650 mg ([place] CLOG DESTROYER) PRN (On Call from Rx), sodium chloride PRN        Objective:                          Vital Signs: Last Filed                 Vital Signs: 24 Hour Range   BP: 140/50 (03/20 0803)  Temp: 36.6 ?C (97.9 ?F) (03/20 1478)  Pulse: 97 (03/20 0803)  Respirations: 16 PER MINUTE (03/20 0803)  SpO2: 95 % (03/20 0803)  O2 Device: None (Room air) (03/20 0803) BP: (104-140)/(50-77)   Temp:  [36.6 ?C (97.8 ?F)-36.7 ?C (98 ?F)]   Pulse:  [83-101]   Respirations:  [14 PER MINUTE-18 PER MINUTE]   SpO2:  [94 %-97 %]   O2 Device: None (Room air)   Intensity Pain Scale (Self Report): 8 (12/18/23 0900) Vitals:  12/08/23 6213 12/08/23 1445 12/09/23 1715   Weight: 102 kg (224 lb 13.9 oz) 101.5 kg (223 lb 12.8 oz) 101.5 kg (223 lb 12.8 oz)       Intake/Output Summary:  (Last 24 hours)    Intake/Output Summary (Last 24 hours) at 12/18/2023 1119  Last data filed at 12/18/2023 0830  Gross per 24 hour   Intake 2458 ml   Output 350 ml   Net 2108 ml           Physical Exam  Constitutional:       General: He is not in acute distress.     Appearance: Normal appearance.   HENT:      Head: Normocephalic and atraumatic.      Nose: Nose normal.   Eyes:      Conjunctiva/sclera: Conjunctivae normal.   Cardiovascular:      Rate and Rhythm: Normal rate and regular rhythm.      Heart sounds: Normal heart sounds. No murmur heard.  Pulmonary:      Effort: Pulmonary effort is normal. No respiratory distress.      Breath sounds: Normal breath sounds.   Skin:     General: Skin is warm and dry.   Neurological:      Mental Status: He is alert. He is disoriented.      Comments: Remains encephalopathic, does not remember events of last night              Point of Care Testing  (Last 24 hours)  Glucose: (!) 137 (12/18/23 0559)  POC Glucose (Download): (!) 239 (12/18/23 0802)    Radiology and other Diagnostics Review:    CT NECK W/CONTRAST  Result Date: 12/08/2023  1.  Marked decrease size of the prior large left oropharyngeal mass. There is a residual 1.7 x 1.5 cm lesion involving the left tonsil with central fluid attenuation. Treated necrotic tumor is the leading consideration. A post therapeutic retention cyst could appear similar. An abscess is thought less likely though direct visualization and clinical correlation is recommended regarding symptoms of infection. 2.  Improvement of bilateral cervical lymphadenopathy. By my electronic signature, I attest that I have personally reviewed the images for this examination and formulated the interpretations and opinions expressed in this report  Finalized by Marily Memos, M.D. on 12/08/2023 12:00 PM. Dictated by Jerene Pitch, MD on 12/08/2023 11:07 AM.    CT HEAD WO CONTRAST  Result Date: 12/08/2023  1.  No acute intracranial hemorrhage or mass effect. 2.  Mild patchy cerebral white matter hypodensities, likely sequelae of chronic microvascular ischemia. 3.  The CT neck is dictated separately. Please see that report. By my electronic signature, I attest that I have personally reviewed the images for this examination and formulated the interpretations and opinions expressed in this report  Finalized by Marily Memos, M.D. on 12/08/2023 12:00 PM. Dictated by Jerene Pitch, MD on 12/08/2023 10:51 AM.

## 2023-12-18 NOTE — Progress Notes
 PALLIATIVE CARE INPATIENT NOTE     Name: Daryl Baker            MRN: [id number]                DOB: August 04, 1956          Age: 68 y.o.  Admission Date: 12/08/2023             LOS: 10 days    ASSESSMENT/PLAN   Daryl Baker is a 68 y.o. male with with a past medical history of hypertension, diabetes type 2, left-sided HPV mediated oropharyngeal cancer undergoing radiation therapy who presented to [facility] on 12/08/2023 for dysphagia, oropharyngeal pain and inability to tolerate p.o. intake. The palliative care team was consulted for assistance with symptom management for radiation induced mucositis.    # Pain, secondary to malignancy  # Oropharyngeal carcinoma, left, HPV mediated  # Dysphagia  # Poor p.o. intake    Discussion: Our team visited with Daryl Baker this morning.  He had received his last of 33 radiation treatments.  He told us that his pain was feeling better and that he had slept better last night.  He fell asleep quite a bit while we were talking with him and at times was confused.  Our plan remains unchanged with pain management including fentanyl patch, oxycodone and IV Dilaudid for severe pain.    RECOMMENDATIONS: (No new changes)  Continue fentanyl patch 66mcg/hr (increased 3/18)   Continue Oxycodone 15 mg Q3H PRN - (used 2 doses in past 24 hours)  Continue Dilaudid 0.5 to 1mg  IV Q2 hours PRN - (used 3 doses in past 24 hours)  Continue gabapentin 300 mg 3 times daily  Continue senna 2 tab BID, continue miralax 1 packet twice daily.     Discussed with Dr. Jeannette Corpus.     Hardie Pulley MD  Division of Palliative Medicine  Available on [facility]  Available on [facility] [phone]  Palliative ON-CALL Pager [phone]    High medical decision making due to the following:  1 or more chronic illness with side effects due to treatment  drug therapy requiring intensive monitoring for toxicity and IV controlled substances    PALLIATIVE CARE PLANNING     Advance Care Planning:   Identified Health Care Decision Maker: DPOA - Would benefit from completion of DPOA  TPOPP - Not discussed    PC Clinic - NA    Medication safety - Advise a Narcan prescription on discharge    Disposition planning: Pending       SUBJECTIVE     CC/Reason for Visit: Symptoms (pain)    Interval HPI: When we saw Daryl Baker today, he looks more comfortable but still uncomfortable.  He reported that his pain was better.  He fell asleep at times and sometimes his answers were not consistent with our conversation.  He was clearly still a little confused.  No family at the bedside.    ROS: Review of Systems   Constitutional:  Positive for malaise/fatigue.   HENT:          Severe mouth pain.         OBJECTIVE     Blood pressure 131/63, pulse 97, temperature 36.7 ?C (98 ?F), height 179.1 cm (5' 10.5), weight 101.5 kg (223 lb 12.8 oz), SpO2 97%.  Physical Exam  Constitutional:       Comments: Uncomfortable appearing male, sitting up in in bed, looks less uncomfortable.   HENT:      Head:  Normocephalic.   Eyes:      Extraocular Movements: Extraocular movements intact.   Cardiovascular:      Rate and Rhythm: Normal rate.   Pulmonary:      Effort: Pulmonary effort is normal.   Abdominal:      General: There is distension.      Palpations: Abdomen is soft.      Tenderness: There is no abdominal tenderness.   Musculoskeletal:         General: Normal range of motion.      Cervical back: Normal range of motion.   Skin:     General: Skin is warm and dry.      Coloration: Skin is pale.   Neurological:      Mental Status: He is alert. He is confused.   Psychiatric:         Attention and Perception: He is inattentive.         Behavior: Behavior is cooperative.       Lab Results:  CBC   Lab Results   Component Value Date/Time    WBC 1.00 (L) 12/18/2023 05:59 AM    HGB 7.8 (L) 12/18/2023 05:59 AM    PLTCT 198 12/18/2023 05:59 AM     Lab Results   Component Value Date/Time    NEUT 60.0 12/18/2023 05:59 AM    ANC 0.60 (L) 12/18/2023 05:59 AM      Chemistries   Lab Results Component Value Date/Time    NA 139 12/18/2023 05:59 AM    K 4.3 12/18/2023 05:59 AM    BUN 16 12/18/2023 05:59 AM    CR 0.54 12/18/2023 05:59 AM    GLU 137 (H) 12/18/2023 05:59 AM     Lab Results   Component Value Date/Time    CA 8.1 (L) 12/18/2023 05:59 AM    PO4 3.9 12/15/2023 04:27 AM    ALBUMIN 2.8 (L) 12/18/2023 05:59 AM    TOTPROT 5.3 (L) 12/18/2023 05:59 AM    ALKPHOS 50 12/18/2023 05:59 AM    AST 14 12/18/2023 05:59 AM    ALT 14 12/18/2023 05:59 AM    TOTBILI 0.5 12/18/2023 05:59 AM    GFR >60 12/18/2023 05:59 AM        Other Pertinent Diagnostic Results:   NA

## 2023-12-18 NOTE — Case Management (ED)
 Case Management Progress Note    NAME:Daryl Baker                          MRN: [id number]              DOB:Apr 26, 1956          AGE: 68 y.o.  ADMISSION DATE: 12/08/2023             DAYS ADMITTED: LOS: 10 days      Today's Date: 12/18/2023    PLAN: DC planning, ongoing     Expected Discharge Date: 12/19/2023   Is Patient Medically Stable: No, Please explain: alt MS  Are there Barriers to Discharge? no    INTERVENTION/DISPOSITION:  Discharge Planning              Discharge Planning: Home Infusion-Enteral-TP[facility], Home Health  -Discussed with team for dc planning  -Call from Jeanene Erb enteral liaison, [phone]. Provided update that pt is not likely to dc this week. She shared that she spoke with Mrs. Collymore who said that they are married in name only. She is not planning to or is able to provide hands on care when he discharges. [name] will need to be able to manage his own enteral needs and cares. She does share the same house with him and will check on him from time to time but she is not planning on providing consistent care or support. Update to primary Onc LMSW, Jen.     Transportation              Does the Patient Need Case Management to Arrange Discharge Transport? (ex: facility, ambulance, wheelchair/stretcher, Medicaid, cab, other): No  Transportation Name, Phone and Availability #1: francisca, harbuck (Spouse)  [phone] (Mobile)  Support              Support: Pt/Family Updates re:POC or DC Plan, Huddle/team update  Info or Referral                 Positive SDOH Domains and Potential Barriers                   Medication Needs                                                                                                                                                         Financial                 Legal                 Other                 Discharge Disposition Selected Continued Care - Admitted Since 12/08/2023       [facility] Coordination complete.  Service Provider Services Address Phone Fax Patient Preferred    [facility] [address], [place] [address] [phone] --              [facility] Coordination complete.      Service Provider Services Address Phone Fax Patient Preferred    [facility] [address], [place] [address] [phone] --                      Greer Ee, RN

## 2023-12-18 NOTE — Progress Notes
 END OF SHIFT/PLAN OF CARE NURSING NOTE    Admission Date: 12/08/2023  Length of Stay: LOS: 10 days    Acute events, pain management, and nursing interventions: Patient sleepy but alert to voice with spontaneous eye opening, answered oriented questions appropriately but some remarks indicate confusion. Patient endorsed throat/mouth pain --> PRN Tylenol, oxycodone, and IV dilaudid utilized. Patient received last radiation treatment today and had a bowel movement today.    Communication with providers: None    Intake and Output:        Intake/Output Summary (Last 24 hours) at 12/18/2023 1832  Last data filed at 12/18/2023 1800  Gross per 24 hour   Intake 2525 ml   Output 450 ml   Net 2075 ml        Last Bowel Movement Date: 12/18/23    Fall Risk/JHFRAT Interventions and Education:   Elimination: Toilet  Medications: BM meds, pain meds   Bed/chair alarm: On  Patient Care Equipment: IV pole, walker  Mobility: UW/1  Cognition: Oriented but forgetful     Patient Education  Patient-specific education provided related to quality/safety:  Fall risk  Patient-specific education provided (other): Pain medications   Learners: Patient  Method/materials used: Therapist, art  Response to learning: Some Evidence of Learning, Needs Reinforcement    Patient Goal(s):  Patient will Verbalize pain has improved by the end of shift         Patient will  Improve their nutritional intake  by discharge date    Restraints:  No

## 2023-12-18 NOTE — Progress Notes
 Daryl Baker.received radiation therapy treatment today.  33 of 33 treatments.

## 2023-12-18 NOTE — Progress Notes
Extended monitoring 65 minute outpatient EEG completed without complications.     Paperwork including the patient history and EEG information sheet was provided to the patient.     The test was explained to the patient in detail and all questions were answered regarding the performing of the EEG.     All electrodes were below 5,000kOhm and recording properly. Photic Stimulation was performed. Hyperventilation was not performed due to current ACNS guidelines.    After the test the scalp was inspected for any skin breakdown and was cleaned with warm water and  a washcloth. Education was given to the patient about proper hair care after the EEG.    The patient was educated to check in MyChart or contact the physician's office in approximately 2 weeks regarding the results of their EEG.

## 2023-12-18 NOTE — Case Management (ED)
 Case Management Progress Note    NAME:Daryl Baker                          MRN: [id number]              DOB:[birth year]          AGE: 68 y.o.  ADMISSION DATE: 12/08/2023             DAYS ADMITTED: LOS: 10 days      Today's Date: 12/18/2023    PLAN: Palliative care continues to follow for support and symptom management.     Expected Discharge Date: 12/19/2023   Is Patient Medically Stable: No, Please explain: symptoms, AMS  Are there Barriers to Discharge? no    INTERVENTION/DISPOSITION:  Discharge Planning              Discharge Planning: Home Infusion-[facility], Home Health   Anticipate will dc home with home health once medically stable.     Transportation              Does the Patient Need Case Management to Arrange Discharge Transport? (ex: facility, ambulance, wheelchair/stretcher, Medicaid, cab, other): No  Transportation Name, Phone and Availability #1: jaideep, pollack (Spouse)  [phone] (Mobile)  Support              Support: Pt/Family Updates re:POC or DC Plan, Huddle/team update   SW rounded with palliative care team   Met with pt. His bedside nurse was also present   Pt remains confused. He shares that he knows he's confused   When asked about pain, he shares that I've got it all over   He rates his pain at 9.5/10. Julian Hy, NP shared that this is better than yesterday, when he rated it at a 20/10.       Will continue to follow     Info or Referral                 Positive SDOH Domains and Potential Barriers                   Medication Needs                                                                                                                                                         Financial                 Legal                 Other                 Discharge Disposition  Selected Continued Care - Admitted Since 12/08/2023       [facility] Coordination complete.      Service Provider Services Address Phone Fax Patient Preferred    [facility] [address], [place] [address] [phone] --              [facility] Coordination complete.      Service Provider Services Address Phone Fax Patient Preferred    [facility] [address], [place] [address] [phone] --                  Briscoe Burns, LSCSW  [employer]

## 2023-12-19 LAB — MANUAL DIFF
~~LOC~~ BKR BANDS - RELATIVE: 2 % (ref 0–10)
~~LOC~~ BKR LYMPHOCYTES - RELATIVE: 13 % — ABNORMAL LOW (ref 24–44)
~~LOC~~ BKR METAMYELOCYTES - RELATIVE: 3 % (ref 0–10)
~~LOC~~ BKR MONOCYTES - RELATIVE: 18 % — ABNORMAL HIGH (ref 4–12)
~~LOC~~ BKR MYELOCYTES - RELATIVE: 1 % (ref 1.8–7.0)
~~LOC~~ BKR NEUT+BANDS - ABSOLUTE: 0.7 10*3/uL — ABNORMAL LOW (ref 1.8–7.0)
~~LOC~~ BKR NEUTROPHILS - RELATIVE: 60 % — ABNORMAL LOW (ref 41–77)
~~LOC~~ BKR OTHER - RELATIVE: 3 %

## 2023-12-19 LAB — POC GLUCOSE
~~LOC~~ BKR POC GLUCOSE: 228 mg/dL — ABNORMAL HIGH (ref 70–100)
~~LOC~~ BKR POC GLUCOSE: 220 mg/dL — ABNORMAL HIGH (ref 70–100)
~~LOC~~ BKR POC GLUCOSE: 246 mg/dL — ABNORMAL HIGH (ref 70–100)
~~LOC~~ BKR POC GLUCOSE: 308 mg/dL — ABNORMAL HIGH (ref 70–100)

## 2023-12-19 LAB — CBC AND DIFF
~~LOC~~ BKR MCH: 33 pg — ABNORMAL HIGH (ref 26.0–34.0)
~~LOC~~ BKR MPV: 8.4 fL — ABNORMAL LOW (ref 7.0–11.0)
~~LOC~~ BKR PLATELET COUNT: 238 10*3/uL — ABNORMAL LOW (ref 150–400)
~~LOC~~ BKR RDW: 17 % — ABNORMAL HIGH (ref 11.0–15.0)

## 2023-12-19 LAB — COMPREHENSIVE METABOLIC PANEL
~~LOC~~ BKR ALBUMIN: 2.9 g/dL — ABNORMAL LOW (ref 3.5–5.0)
~~LOC~~ BKR ALK PHOSPHATASE: 51 U/L — ABNORMAL LOW (ref 25–110)
~~LOC~~ BKR ALT: 13 U/L — ABNORMAL HIGH (ref 7–56)
~~LOC~~ BKR ANION GAP: 6 pg — ABNORMAL LOW (ref 3–12)
~~LOC~~ BKR AST: 11 U/L — ABNORMAL HIGH (ref 7–40)
~~LOC~~ BKR CO2: 29 mmol/L — ABNORMAL LOW (ref 21–30)
~~LOC~~ BKR GLOMERULAR FILTRATION RATE (GFR): >60 mL/min — ABNORMAL LOW (ref >60–7.45)
~~LOC~~ BKR TOTAL BILIRUBIN: 0.4 mg/dL — ABNORMAL LOW (ref 0.2–1.3)

## 2023-12-19 LAB — PERIPHERAL SMEAR

## 2023-12-19 LAB — MAGNESIUM: ~~LOC~~ BKR MAGNESIUM: 1.6 mg/dL — ABNORMAL LOW (ref 1.6–2.6)

## 2023-12-19 MED ORDER — FENTANYL 50 MCG/HR TD PT72
1 | TRANSDERMAL | 0 refills | Status: DC
Start: 2023-12-19 — End: 2024-01-02
  Administered 2023-12-19 – 2023-12-31 (×5): 1 via TRANSDERMAL

## 2023-12-19 MED ORDER — PATCH DOCUMENTATION - FENTANYL 50 MCG/HR
Freq: Two times a day (BID) | TRANSDERMAL | 0 refills | Status: DC
Start: 2023-12-19 — End: 2024-01-02

## 2023-12-19 MED ORDER — POLYETHYLENE GLYCOL 3350 17 GRAM PO PWPK
1 | Freq: Every day | GASTROSTOMY | 0 refills | Status: DC
Start: 2023-12-19 — End: 2024-01-02
  Administered 2023-12-20 – 2024-01-02 (×9): 17 g via GASTROSTOMY

## 2023-12-19 MED ORDER — SENNOSIDES-DOCUSATE SODIUM 8.6-50 MG PO TAB
2 | Freq: Every day | GASTROSTOMY | 0 refills | Status: DC
Start: 2023-12-19 — End: 2024-01-02
  Administered 2023-12-20 – 2024-01-01 (×10): 2 via GASTROSTOMY

## 2023-12-19 NOTE — Care Plan
 Problem: Discharge Planning  Goal: Participation in plan of care  Outcome: Goal Ongoing     Problem: Discharge Planning  Goal: Participation in plan of care  Outcome: Goal Ongoing  Goal: Knowledge regarding plan of care  Outcome: Goal Ongoing  Goal: Prepared for discharge  Outcome: Goal Ongoing     Problem: Moderate Fall Risk  Goal: Moderate Fall Risk  Outcome: Goal Ongoing     Problem: Nutrition Deficit  Goal: Adequate nutritional intake  Outcome: Goal Ongoing  Goal: Body weight within specified parameters  Outcome: Goal Ongoing     Problem: Infection, Risk of  Goal: Absence of infection  Outcome: Goal Ongoing  Goal: Knowledge of Infection Control Procedures  Outcome: Goal Ongoing     Problem: Pain  Goal: Management of pain  Outcome: Goal Ongoing  Goal: Knowledge of pain management  Outcome: Goal Ongoing  Goal: Progress Toward Pain Management Goals  Outcome: Goal Ongoing

## 2023-12-19 NOTE — Progress Notes
 CLINICAL NUTRITION                                                        Clinical Nutrition Follow-Up Assessment     Name: Daryl Baker   MRN: [id number]     DOB: June 13, 1956      Age: 68 y.o.  Admission Date: 12/08/2023     LOS: 11 days     Date of Service: 12/19/2023        Recommendation:  Least restrictive PO diet per SLP/primary team.      Continue EN bolus goal regimen of 5.5 cartons of TwoCal HN (or equivalent Nutren 2.0) per day.   TwoCal HN goal of 1 carton (237 mL) x 4 feeds per day + 1.5 cartons (~356 mL) once daily for a total of 5.5 cartons per day.   Goal provides 2613 kcal/day, 109 g protein/day, and 913 mL free water/day.   Provide at least 30 ml free water before and after each bolus feed. Additional fluids per primary team. Consider 50 mL water flush before and after each bolus feed. Consider providing additional 245 mL water bolus QID for hydration needs.      Monitor wt, intake, intake tolerance, GI function, and lab data.     Recommend ongoing follow-up with outpatient dietitian for tube feeding management. Pt will need G-tube EN for the foreseeable future.    Comments:  68 y.o. 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 intake admitted for further management. Labs and meds reviewed. Pt was doing continuous EN and able to tolerate EN. Was then switched to bolus EN with PEG tube 12/13/23. Two bolus feeds were skipped 3/18 d/t BG of 437 (and refused insulin) and then later the same day d/t pt refusing to let RN check his BG. Has been receiving all his bolus feeds for the past 2 days (3/19 and 3/20) per chart review.    Met with pt at bedside for follow-up. Pt still confused, so unsure of the accuracy of pt's answers. Pt denies N/V/C/D. Inadequate oral intake, as can be expected with restrictive PO diet order. Current EN interventions appropriate. Will continue to monitor.                            Nutrition Assessment of Patient:  Admit Weight: 102 kg; Weight Change Since Admit: +8.7 kg; DBW: 79.9 kg  BMI (Calculated): 31.66;    Pertinent Allergies/Intolerances: NKFA     Oral Diet Order: Clear liquid;    Current EN Order: TwoCal HN bolus feeds: 1 carton (237 mL) QID + 1.5 carton (~356 mL) at 2200 feeding for a total of 5.5 cartons per day  Current Energy Intake: Inadequate    Weight Used for Calculation: 79.9 kg  Estimated Calorie Needs: 2395-2795 kcal (30-35 kcal/kg DBW)  Estimated Protein Needs: 104-120 g protein (1.3-1.5 g protein/kg DBW)    Malnutrition Assessment:  Malnutrition present on admission  ICD-10 code E43: Chronic illness/Severe malnutrition        Energy intake: 75% or less of estimated energy requirement for 1 month or more, Weight loss: Greater than 20% x 1 year  Malnutrition Interventions: Bolus EN    Nutrition Focused Physical Exam:  Loss of Subcutaneous Fat: Yes; Severity: Moderate; Location: Triceps  Muscle Wasting: Yes; Severity: Mild; Location: Temple, Deltoid  Physical Assessment:  RLE Edema: Non-pitting  LLE Edema: Non-pitting  Pedal Edema: Non-pitting     Pressure Injury: Stage 2 buttocks     Comment: LBM 12/19/23    Nutrition Diagnosis:  Inadequate oral intake  Etiology: mouth pain, PO intake intolerance, poor appetite  Signs & Symptoms: pt report per chart review, wife report, need for G-tube placement and EN recs                      Intervention / Plan:  Continue bolus EN  Will continue to monitor wt, intake, intake tolerance, and lab data          Reginal Lutes, MS, RDN, LD  Clinical Dietitian - Department of Clinical Nutrition  Available on [employer]

## 2023-12-19 NOTE — Progress Notes
 PALLIATIVE CARE INPATIENT NOTE     Name: Daryl Baker            MRN: [id number]                DOB: [birth year]          Age: 68 y.o.  Admission Date: 12/08/2023             LOS: 11 days    ASSESSMENT/PLAN   Daryl Baker is a 68 y.o. 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 intake. The palliative care team was consulted for assistance with symptom management for radiation induced mucositis.    # Pain, secondary to malignancy  # Oropharyngeal carcinoma, left, HPV mediated  # Dysphagia  # Poor p.o. intake    Discussion: The palliative care team visited with Daryl Baker this morning.  He seemed to be almost in a jovial mood.  He stated that his pain is 9 out of 10 and that it is always 9 out of 10.  He remains confused and thought he was in [place], but did eventually remember he was in [place].  He denies any new complaints.  The plan will be to decrease his fentanyl patch to 50 mcg/h to see if this helps with his delirium.  Through our entire time treating him with multiple opioids he does not perceive that his pain is changed at all.  He has always declined  swish and spit or swish and swallow medications.  We will leave the remaining pain regimen plan in place.  The palliative care team will not plan on seeing Daryl Baker over the weekend.  We will check his chart and see him if it appears that his pain is becoming uncontrolled.  If our services are needed otherwise, please page the palliative provider on-call.  Dr. Stevphen Rochester will be on-call over the weekend.    RECOMMENDATIONS: (No new changes)  Decreased fentanyl patch to 50 mcg/h.  Continue Oxycodone 15 mg Q3H PRN - (used 5 6 doses in past 24 hours)  Continue Dilaudid 0.5 to 1mg  IV Q2 hours PRN - (used 6  Doses in past 24 hours)  Continue gabapentin 300 mg 3 times daily  Continue senna 2 tab BID, continue miralax 1 packet twice daily.     Discussed with Dr. Jeannette Corpus.     Hardie Pulley MD  Division of Palliative Medicine  Available on [facility]  Available on [facility] [phone]  Palliative ON-CALL Pager [phone]    High medical decision making due to the following:  1 or more chronic illness with side effects due to treatment  drug therapy requiring intensive monitoring for toxicity and IV controlled substances    PALLIATIVE CARE PLANNING     Advance Care Planning:   Identified Health Care Decision Maker:    DPOA - Would benefit from completion of DPOA  TPOPP - Not discussed    PC Clinic - NA    Medication safety - Advise a Narcan prescription on discharge    Disposition planning: Pending       SUBJECTIVE     CC/Reason for Visit: Symptoms (pain)    Interval HPI: When we saw Daryl Baker today, he looks more comfortable but still uncomfortable.  He reported that his pain was better.  He fell asleep at times and sometimes his answers were  not consistent with our conversation.  He was clearly still a little confused.  No family at the bedside.    ROS: Review of Systems   Constitutional:  Positive for malaise/fatigue.   HENT:          Severe mouth pain.         OBJECTIVE     Blood pressure 121/78, pulse 91, temperature 36.9 ?C (98.5 ?F), height 179.1 cm (5' 10.5), weight 101.5 kg (223 lb 12.8 oz), SpO2 95%.  Physical Exam  Constitutional:       Comments: Uncomfortable appearing male, sitting up in in bed, looks less uncomfortable.   HENT:      Head: Normocephalic.   Eyes:      Extraocular Movements: Extraocular movements intact.   Cardiovascular:      Rate and Rhythm: Normal rate.   Pulmonary:      Effort: Pulmonary effort is normal.   Abdominal:      General: There is distension.      Palpations: Abdomen is soft.      Tenderness: There is no abdominal tenderness.   Musculoskeletal:         General: Normal range of motion.      Cervical back: Normal range of motion.   Skin:     General: Skin is warm and dry.      Coloration: Skin is pale.   Neurological:      Mental Status: He is alert. He is confused.   Psychiatric:         Attention and Perception: He is inattentive.         Behavior: Behavior is cooperative.       Lab Results:  CBC   Lab Results   Component Value Date/Time    WBC 1.10 (L) 12/19/2023 03:31 AM    HGB 7.5 (L) 12/19/2023 03:31 AM    PLTCT 238 12/19/2023 03:31 AM     Lab Results   Component Value Date/Time    NEUT 60.0 12/18/2023 05:59 AM    ANC 0.7 (L) 12/19/2023 03:31 AM      Chemistries   Lab Results   Component Value Date/Time    NA 136 (L) 12/19/2023 03:31 AM    K 4.3 12/19/2023 03:31 AM    BUN 14 12/19/2023 03:31 AM    CR 0.48 12/19/2023 03:31 AM    GLU 196 (H) 12/19/2023 03:31 AM     Lab Results   Component Value Date/Time    CA 8.1 (L) 12/19/2023 03:31 AM    PO4 3.9 12/15/2023 04:27 AM    ALBUMIN 2.9 (L) 12/19/2023 03:31 AM    TOTPROT 5.3 (L) 12/19/2023 03:31 AM    ALKPHOS 51 12/19/2023 03:31 AM    AST 11 12/19/2023 03:31 AM    ALT 13 12/19/2023 03:31 AM    TOTBILI 0.4 12/19/2023 03:31 AM    GFR >60 12/19/2023 03:31 AM        Other Pertinent Diagnostic Results:   NA

## 2023-12-19 NOTE — Progress Notes
 General Progress Note    Name:  Daryl Baker     Today's Date:  12/19/2023  Admission Date: 12/08/2023  LOS: 11 days                     Assessment/Plan:    Principal Problem:    Oropharyngeal mass  Active Problems:    Oropharyngeal cancer (CMS-HCC)    Severe malnutrition    Mouth pain    Drug-induced constipation    Encephalopathy acute      68 y.o. 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 He has had increasing pain over the last few days prior to admission near his oropharyngeal mass on the left along with the left aspect of his tongue preventing any PO intake and has a scheduled G-tube placement.    Interval Events 12/19/2023:   -Finished radiation treatments yesterday  -Decrease fentanyl patch from 75 mcg to 50 mcg  -Continue Zosyn (3/17-3/23)  -EEG: mild encephalopathy    Acute metabolic encephalopathy  -Blood sugar  = normal/ elevated  -Na = wnl, Ca = 8.3  -3/17 venous pH = 7.42  -TSH = 0.39  -Hx of cisplatin 5 cycles, which can cause encephalopathy/PRES  -currently denying headache, has been stooling regularly  -Had a course of thiamine during admission  -EEG: mild encephalopathy  -MRI attempted x2 with ativan but patient was unable to tolerate  Plan:  > re-attempt MRI brain. Patient may need MRI under anesthesia  > high dose thiamine ordered  > Scheduled melatonin & trazodone, continue delirium precautions    Neutropenic fever  Presumed Aspiration pneumonia  Sepsis- resolved  -tachycardia, fever, increase in WBC (leukopenic at baseline)  -source likely pulmonary given imaging findings  -UA unremarkable  -CTA Chest: Development of a few clustered nodular and patchy opacities within the   right greater than left lower lobes, probably infectious or aspiration-related.   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  Plan:  > Trulicity nonformulary, will hold inpatient  > glyburide 2.5 mg daily & metformin 500 BID for hyperglycemia via G-tube    Dysphagia   Oropharyngeal pain  -Due to left oropharyngeal cancer  -G-tube placed 3/12 AM  -He will need the G-tube feeds for >90 days.  Plan:  >Magic mouthwash TID PRN for oropharyngeal pain  > Consulted Dietician  >Transitioned to tube feeds 3/15, formal recs in dietician note  > Palliative Care consult for pain management as above.   -Fentanyl patch 45mcg/hr  -Oxycodone 15mg  q4h PRN  -Dilaudid 0.5-1mg  IV q2h PRN  -Gabapentin 300mg  TID  -Bowel regimen with senokot-s 2ts BID, miralax BID     Left HPV-Mediated Oropharyngeal SCC  -Currently on chemo and radiation  -Follow in Oncology clinic with dr Neita Carp and radiation Oncology Dr Mariane Masters  -CT Neck revealed:  1.  Marked decrease size of the prior large left oropharyngeal mass. There   is a residual 1.7 x 1.5 cm lesion involving the left tonsil with central   fluid attenuation. Treated necrotic tumor is the leading consideration. A  post therapeutic retention cyst could appear similar. An abscess is   thought less likely though direct visualization and clinical correlation   is recommended regarding symptoms of infection.   2.  Improvement of bilateral cervical lymphadenopathy.   Plan:  > Radiation Oncology with continue to perform radiation while inpatient.(3/17-3/19)  > Triamcinolone cream for radiation induced dermatitis    Pancytopenia   Mild Neutropenia  -ANC <1.5  -likely due to Chemo & radiation  -follow with daily CBC     Hypertension  >Continue to hold metoprolol & losartan    FEN:  Bolus tube feeds with free water flushes/Replace electrolytes as needed/Tube feeds  DIET CLEAR LIQUID w/ Body mass index is 31.66 kg/m?Marland Kitchen   DVT PPX:  40mg  qday enoxaparin, SCDs bilat  Code status: Full Code     Patient seen and case discussed with Dr. Lonell Grandchild, MD   Internal Medicine-Psychiatry Resident, PGY1  Available on Vo[employer]   ________________________________________________________________________    Subjective  Patient seen and examined this morning at bedside. Remains encephalopathic. He is A&O x2 but sitting up & alert in bed. He reports that his pain is 8-8.5/10.     Medications  Scheduled Meds:Diet Enteral Feeding Bolus, , SEE ADMIN INSTRUCTIONS, 5XDAY (6,10,14,18,22   And  Water Bolus, 100 mL, PEG Tube, 5XDAY (6,10,14,18,22  enoxaparin (LOVENOX) syringe 40 mg, 40 mg, Subcutaneous, QDAY(21)  fentaNYL (DURAGESIC) 75 mcg/hr patch 1 patch, 1 patch, Transdermal, Q72H*   And  Verification of Patch Placement and Integrity - fentaNYL 75 mcg/hr, , Transdermal, BID  gabapentin (NEURONTIN) capsule 300 mg, 300 mg, Per G Tube, TID  glyBURIDE (DIABETA) tablet 2.5 mg, 2.5 mg, Oral, QDAY  insulin aspart (U-100) (NOVOLOG FLEXPEN U-100 INSULIN) injection PEN 0-12 Units, 0-12 Units, Subcutaneous, ACHS (22)  melatonin tablet 10 mg, 10 mg, Oral, QHS  metFORMIN (GLUCOPHAGE) tablet 500 mg, 500 mg, Oral, BID w/meals  piperacillin/tazobactam (ZOSYN) 4.5 g in sodium chloride 0.9% (NS) 100 mL IVPB (MB+)(EXTENDED INFUSION), 4.5 g, Intravenous, Q6H*  polyethylene glycol 3350 (MIRALAX) packet 17 g, 1 packet, Per G Tube, BID  sennosides-docusate sodium (SENOKOT-S) tablet 2 tablet, 2 tablet, Per G Tube, BID  thiamine hcl (vitamin B1) injection 500 mg, 500 mg, Intravenous, Q8H  traZODone (DESYREL) tablet 25 mg, 25 mg, Oral, QHS  triamcinolone acetonide (KENALOG) 0.1 % topical cream 1 g, 1 g, Topical, BID    Continuous Infusions:      PRN and Respiratory Meds:acetaminophen Q6H PRN, bisacodyL QDAY PRN, dextrose 50% (D50) IV PRN, diphenhydrAMINE/lidocaine/mag,al-simeth(#) TID PRN, HYDROmorphone (DILAUDID) injection Q2H PRN, ondansetron Q6H PRN **OR** ondansetron (ZOFRAN) IV Q6H PRN, oxyCODONE Q3H PRN, pancrelipase 20,880 Units/sodium bicarbonate 650 mg ([place] CLOG DESTROYER) PRN (On Call from Rx), sodium chloride PRN        Objective:                          Vital Signs: Last Filed                 Vital Signs: 24 Hour Range   BP: 125/58 (03/21 0716)  Temp: 36.9 ?C (98.4 ?F) (03/21 1610)  Pulse: 105 (03/21 0716)  Respirations: 18 PER MINUTE (03/21 0716)  SpO2: 99 % (03/21 0716)  O2 Device: None (Room air) (03/21 0716) BP: (119-140)/(50-63)   Temp:  [36.6 ?C (97.9 ?F)-36.9 ?C (98.4 ?F)]   Pulse:  [93-108]   Respirations:  [16 PER MINUTE-18 PER MINUTE]   SpO2:  [  95 %-100 %]   O2 Device: None (Room air)   Intensity Pain Scale (Self Report): 9 (12/19/23 0321) Vitals:    12/08/23 0907 12/08/23 1445 12/09/23 1715   Weight: 102 kg (224 lb 13.9 oz) 101.5 kg (223 lb 12.8 oz) 101.5 kg (223 lb 12.8 oz)       Intake/Output Summary:  (Last 24 hours)    Intake/Output Summary (Last 24 hours) at 12/19/2023 0737  Last data filed at 12/19/2023 0700  Gross per 24 hour   Intake 2178 ml   Output 300 ml   Net 1878 ml           Physical Exam  Constitutional:       General: He is not in acute distress.     Appearance: Normal appearance.   HENT:      Head: Normocephalic and atraumatic.      Nose: Nose normal.   Eyes:      Conjunctiva/sclera: Conjunctivae normal.   Cardiovascular:      Rate and Rhythm: Normal rate and regular rhythm.      Heart sounds: Normal heart sounds. No murmur heard.  Pulmonary:      Effort: Pulmonary effort is normal. No respiratory distress.      Breath sounds: Normal breath sounds.   Skin:     General: Skin is warm and dry.   Neurological:      Mental Status: He is alert. He is disoriented.      Comments: Remains encephalopathic, does not remember events of last night              Point of Care Testing  (Last 24 hours)  Glucose: (!) 196 (12/19/23 0331)  POC Glucose (Download): (!) 220 (12/18/23 2136)    Radiology and other Diagnostics Review:    CT NECK W/CONTRAST  Result Date: 12/08/2023  1.  Marked decrease size of the prior large left oropharyngeal mass. There is a residual 1.7 x 1.5 cm lesion involving the left tonsil with central fluid attenuation. Treated necrotic tumor is the leading consideration. A post therapeutic retention cyst could appear similar. An abscess is thought less likely though direct visualization and clinical correlation is recommended regarding symptoms of infection. 2.  Improvement of bilateral cervical lymphadenopathy. By my electronic signature, I attest that I have personally reviewed the images for this examination and formulated the interpretations and opinions expressed in this report  Finalized by Marily Memos, M.D. on 12/08/2023 12:00 PM. Dictated by Jerene Pitch, MD on 12/08/2023 11:07 AM.    CT HEAD WO CONTRAST  Result Date: 12/08/2023  1.  No acute intracranial hemorrhage or mass effect. 2.  Mild patchy cerebral white matter hypodensities, likely sequelae of chronic microvascular ischemia. 3.  The CT neck is dictated separately. Please see that report. By my electronic signature, I attest that I have personally reviewed the images for this examination and formulated the interpretations and opinions expressed in this report  Finalized by Marily Memos, M.D. on 12/08/2023 12:00 PM. Dictated by Jerene Pitch, MD on 12/08/2023 10:51 AM.

## 2023-12-20 LAB — COMPREHENSIVE METABOLIC PANEL
~~LOC~~ BKR ALBUMIN: 3 g/dL — ABNORMAL LOW (ref 3.5–5.0)
~~LOC~~ BKR ALK PHOSPHATASE: 52 U/L — ABNORMAL LOW (ref 25–110)
~~LOC~~ BKR ALT: 11 U/L — ABNORMAL HIGH (ref 7–56)
~~LOC~~ BKR ANION GAP: 5 fL — ABNORMAL LOW (ref 3–12)
~~LOC~~ BKR AST: 11 U/L — ABNORMAL LOW (ref 7–40)
~~LOC~~ BKR CO2: 30 mmol/L — ABNORMAL LOW (ref 21–30)
~~LOC~~ BKR GLOMERULAR FILTRATION RATE (GFR): >60 mL/min — ABNORMAL HIGH (ref >60–11.00)

## 2023-12-20 LAB — MANUAL DIFF
~~LOC~~ BKR LYMPHOCYTES - RELATIVE: 18 % — ABNORMAL LOW (ref 24–44)
~~LOC~~ BKR METAMYELOCYTES - RELATIVE: 2 %
~~LOC~~ BKR MONOCYTES - RELATIVE: 9 % (ref 4–12)
~~LOC~~ BKR MYELOCYTES - RELATIVE: 3 %
~~LOC~~ BKR NEUT+BANDS - ABSOLUTE: 1.1 10*3/uL — ABNORMAL LOW (ref 1.8–7.0)
~~LOC~~ BKR NEUTROPHILS - RELATIVE: 64 % (ref 41–77)
~~LOC~~ BKR OTHER - RELATIVE: 4 % — ABNORMAL LOW (ref 10.0–20.0)

## 2023-12-20 LAB — CULTURE-BLOOD W/SENSITIVITY

## 2023-12-20 LAB — POC GLUCOSE
~~LOC~~ BKR POC GLUCOSE: 247 mg/dL — ABNORMAL HIGH (ref 70–100)
~~LOC~~ BKR POC GLUCOSE: 152 mg/dL — ABNORMAL HIGH (ref 70–100)
~~LOC~~ BKR POC GLUCOSE: 177 mg/dL — ABNORMAL HIGH (ref 70–100)
~~LOC~~ BKR POC GLUCOSE: 256 mg/dL — ABNORMAL HIGH (ref 70–100)

## 2023-12-20 LAB — CBC AND DIFF
~~LOC~~ BKR MPV: 8.5 fL — ABNORMAL LOW (ref 7.0–11.0)
~~LOC~~ BKR PLATELET COUNT: 295 10*3/uL — ABNORMAL LOW (ref 150–400)

## 2023-12-20 MED ORDER — FLUCONAZOLE 40 MG/ML PO SUSR
200 mg | Freq: Every day | ORAL | 0 refills | Status: CP
Start: 2023-12-20 — End: ?
  Administered 2023-12-20 – 2024-01-02 (×14): 200 mg via ORAL

## 2023-12-20 MED ORDER — TRAZODONE 50 MG PO TAB
50 mg | Freq: Every evening | ORAL | 0 refills | Status: DC
Start: 2023-12-20 — End: 2023-12-22
  Administered 2023-12-21: 04:00:00 50 mg via ORAL

## 2023-12-20 MED ORDER — FLUCONAZOLE IN NACL (ISO-OSM) 400 MG/200 ML IV PGBK
400 mg | INTRAVENOUS | 0 refills | Status: DC
Start: 2023-12-20 — End: 2023-12-20

## 2023-12-20 NOTE — Care Plan
 Problem: Discharge Planning  Goal: Participation in plan of care  Outcome: Goal Ongoing  Goal: Knowledge regarding plan of care  Outcome: Goal Ongoing  Goal: Prepared for discharge  Outcome: Goal Ongoing     Problem: Moderate Fall Risk  Goal: Moderate Fall Risk  Outcome: Goal Ongoing     Problem: Nutrition Deficit  Goal: Adequate nutritional intake  Outcome: Goal Ongoing  Goal: Body weight within specified parameters  Outcome: Goal Ongoing     Problem: Infection, Risk of  Goal: Absence of infection  Outcome: Goal Ongoing  Goal: Knowledge of Infection Control Procedures  Outcome: Goal Ongoing     Problem: Pain  Goal: Management of pain  Outcome: Goal Ongoing  Goal: Knowledge of pain management  Outcome: Goal Ongoing  Goal: Progress Toward Pain Management Goals  Outcome: Goal Ongoing

## 2023-12-20 NOTE — Progress Notes
 General Progress Note    Name:  Daryl Baker     Today's Date:  12/20/2023  Admission Date: 12/08/2023  LOS: 12 days                     Assessment/Plan:    Principal Problem:    Oropharyngeal mass  Active Problems:    Oropharyngeal cancer (CMS-HCC)    Severe malnutrition    Mouth pain    Drug-induced constipation    Encephalopathy acute      68 y.o. 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 He has had increasing pain over the last few days prior to admission near his oropharyngeal mass on the left along with the left aspect of his tongue preventing any PO intake and has a scheduled G-tube placement.    Interval Events 12/20/2023:   -Start fluconazole for oral candidiasis  -Schedule trazodone 50mg  qhs for sleep  -MRI under anesthesia Monday if encephalopathy is unresolved.    Acute metabolic encephalopathy  -Blood sugar  = normal/ elevated  -Na = wnl, Ca = 8.3  -3/17 venous pH = 7.42  -TSH = 0.39  -Hx of cisplatin 5 cycles, which can cause encephalopathy/PRES  -currently denying headache, has been stooling regularly  -Had a course of thiamine during admission  -EEG: mild encephalopathy  -MRI attempted x2 with ativan but patient was unable to tolerate  Plan:  > re-attempt MRI brain. Patient may need MRI under anesthesia  > high dose thiamine ordered  > Scheduled melatonin & trazodone increased to 50mg , continue delirium precautions    Neutropenic fever  Presumed Aspiration pneumonia  Sepsis- resolved  -tachycardia, fever, increase in WBC (leukopenic at baseline)  -source likely pulmonary given imaging findings  -UA unremarkable  -CTA Chest: Development of a few clustered nodular and patchy opacities within the   right greater than left lower lobes, probably infectious or aspiration-related.   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  Plan:  > Trulicity nonformulary, will hold inpatient  > glyburide 2.5 mg daily & metformin 500 BID for hyperglycemia via G-tube    Dysphagia   Oropharyngeal pain  -Due to left oropharyngeal cancer  -G-tube placed 3/12 AM  -He will need the G-tube feeds for >90 days.  Plan:  >Magic mouthwash TID PRN for oropharyngeal pain  > Consulted Dietician  >Transitioned to tube feeds 3/15, formal recs in dietician note  > Palliative Care consult for pain management as above.   -Fentanyl patch 63mcg/hr  -Oxycodone 15mg  q4h PRN  -Dilaudid 0.5-1mg  IV q2h PRN  -Gabapentin 300mg  TID  -Bowel regimen with senokot-s 2ts BID, miralax BID  >Fluconazole 400mg  qday for oral candidiasis     Left HPV-Mediated Oropharyngeal SCC  -Currently on chemo and radiation  -Follow in Oncology clinic with dr Neita Carp and radiation Oncology Dr Mariane Masters  -CT Neck revealed:  1.  Marked decrease size of the prior large left oropharyngeal mass. There   is a residual 1.7 x 1.5 cm lesion involving the left tonsil with central   fluid attenuation.  Treated necrotic tumor is the leading consideration. A   post therapeutic retention cyst could appear similar. An abscess is   thought less likely though direct visualization and clinical correlation   is recommended regarding symptoms of infection.   2.  Improvement of bilateral cervical lymphadenopathy.   Plan:  > Radiation Oncology with continue to perform radiation while inpatient.(3/17-3/19)  > Triamcinolone cream for radiation induced dermatitis    Pancytopenia   Neutropenia  -ANC <1  -likely due to Chemo & radiation  -follow with daily CBC w/ Diff     Hypertension  >Continue to hold metoprolol & losartan    FEN:  Bolus tube feeds with free water flushes/Replace electrolytes as needed/Tube feeds  DIET CLEAR LIQUID w/ Body mass index is 31.66 kg/m?Marland Kitchen   DVT PPX:  40mg  qday enoxaparin, SCDs bilat  Code status: Full Code     Patient seen and case discussed with Dr. Lonell Grandchild, MD   Internal Medicine-Psychiatry Resident, PGY1  Available on [employer]   ________________________________________________________________________    Subjective  Patient seen and examined this morning at bedside. Remains encephalopathic. He is A&O x2 but sitting up & alert in bed. He is unable to appropriately answer questions.    Medications  Scheduled Meds:Diet Enteral Feeding Bolus, , SEE ADMIN INSTRUCTIONS, 5XDAY (6,10,14,18,22   And  Water Bolus, 100 mL, PEG Tube, 5XDAY (6,10,14,18,22  enoxaparin (LOVENOX) syringe 40 mg, 40 mg, Subcutaneous, QDAY(21)  fentaNYL (DURAGESIC) 50 mcg/hr patch 1 patch, 1 patch, Transdermal, Q72H*   And  Verification of Patch Placement and Integrity - fentaNYL 50 mcg/hr, , Transdermal, BID  gabapentin (NEURONTIN) capsule 300 mg, 300 mg, Per G Tube, TID  glyBURIDE (DIABETA) tablet 2.5 mg, 2.5 mg, Oral, QDAY  melatonin tablet 10 mg, 10 mg, Oral, QHS  metFORMIN (GLUCOPHAGE) tablet 500 mg, 500 mg, Oral, BID w/meals  piperacillin/tazobactam (ZOSYN) 4.5 g in sodium chloride 0.9% (NS) 100 mL IVPB (MB+)(EXTENDED INFUSION), 4.5 g, Intravenous, Q6H*  polyethylene glycol 3350 (MIRALAX) packet 17 g, 1 packet, Per G Tube, QDAY  sennosides-docusate sodium (SENOKOT-S) tablet 2 tablet, 2 tablet, Per G Tube, QDAY  thiamine hcl (vitamin B1) injection 500 mg, 500 mg, Intravenous, Q8H  traZODone (DESYREL) tablet 25 mg, 25 mg, Oral, QHS  triamcinolone acetonide (KENALOG) 0.1 % topical cream 1 g, 1 g, Topical, BID    Continuous Infusions:      PRN and Respiratory Meds:acetaminophen Q6H PRN, bisacodyL QDAY PRN, dextrose 50% (D50) IV PRN, diphenhydrAMINE/lidocaine/mag,al-simeth(#) TID PRN, HYDROmorphone (DILAUDID) injection Q2H PRN, ondansetron Q6H PRN **OR** ondansetron (ZOFRAN) IV Q6H PRN, oxyCODONE Q3H PRN, pancrelipase 20,880 Units/sodium bicarbonate 650 mg ( CLOG DESTROYER) PRN (On Call from Rx), sodium chloride PRN        Objective:                          Vital Signs: Last Filed                 Vital Signs: 24 Hour Range   BP: 144/62 (03/22 0352)  Temp: 36.7 ?C (98 ?F) (03/22 0352)  Pulse: 99 (03/22 0352)  Respirations: 18 PER MINUTE (03/21 1956)  SpO2: 95 % (03/22 0352)  O2 Device: None (Room air) (03/22 0352) BP: (121-180)/(58-78)   Temp:  [36.7 ?C (98 ?F)-37.1 ?C (98.7 ?F)]   Pulse:  [91-115]   Respirations:  [17 PER MINUTE-18 PER MINUTE]   SpO2:  [95 %-99 %]   O2 Device:  None (Room air)   Intensity Pain Scale (Self Report): 10 (12/19/23 2101) Vitals:    12/08/23 0907 12/08/23 1445 12/09/23 1715   Weight: 102 kg (224 lb 13.9 oz) 101.5 kg (223 lb 12.8 oz) 101.5 kg (223 lb 12.8 oz)       Intake/Output Summary:  (Last 24 hours)    Intake/Output Summary (Last 24 hours) at 12/20/2023 0700  Last data filed at 12/20/2023 0310  Gross per 24 hour   Intake 1827 ml   Output 600 ml   Net 1227 ml           Physical Exam  Constitutional:       General: He is not in acute distress.     Appearance: Normal appearance.   HENT:      Head: Normocephalic and atraumatic.      Nose: Nose normal.   Eyes:      Conjunctiva/sclera: Conjunctivae normal.   Cardiovascular:      Rate and Rhythm: Normal rate and regular rhythm.      Heart sounds: Normal heart sounds. No murmur heard.  Pulmonary:      Effort: Pulmonary effort is normal. No respiratory distress.      Breath sounds: Normal breath sounds.   Skin:     General: Skin is warm and dry.   Neurological:      Mental Status: He is alert. He is disoriented.      Comments: Remains encephalopathic, does not remember events of last night              Point of Care Testing  (Last 24 hours)  Glucose: (!) 158 (12/20/23 0340)  POC Glucose (Download): (!) 247 (12/19/23 2158)    Radiology and other Diagnostics Review:    CT NECK W/CONTRAST  Result Date: 12/08/2023  1.  Marked decrease size of the prior large left oropharyngeal mass. There is a residual 1.7 x 1.5 cm lesion involving the left tonsil with central fluid attenuation. Treated necrotic tumor is the leading consideration. A post therapeutic retention cyst could appear similar. An abscess is thought less likely though direct visualization and clinical correlation is recommended regarding symptoms of infection. 2.  Improvement of bilateral cervical lymphadenopathy. By my electronic signature, I attest that I have personally reviewed the images for this examination and formulated the interpretations and opinions expressed in this report  Finalized by Marily Memos, M.D. on 12/08/2023 12:00 PM. Dictated by Jerene Pitch, MD on 12/08/2023 11:07 AM.    CT HEAD WO CONTRAST  Result Date: 12/08/2023  1.  No acute intracranial hemorrhage or mass effect. 2.  Mild patchy cerebral white matter hypodensities, likely sequelae of chronic microvascular ischemia. 3.  The CT neck is dictated separately. Please see that report. By my electronic signature, I attest that I have personally reviewed the images for this examination and formulated the interpretations and opinions expressed in this report  Finalized by Marily Memos, M.D. on 12/08/2023 12:00 PM. Dictated by Jerene Pitch, MD on 12/08/2023 10:51 AM.

## 2023-12-21 LAB — CBC AND DIFF
~~LOC~~ BKR RBC COUNT: 2.3 10*6/uL — ABNORMAL LOW (ref 4.40–5.50)
~~LOC~~ BKR WBC COUNT: 2.8 10*3/uL — ABNORMAL LOW (ref 4.50–11.00)

## 2023-12-21 LAB — MANUAL DIFF
~~LOC~~ BKR BANDS - RELATIVE: 2 % (ref 0–10)
~~LOC~~ BKR BASOPHILS - RELATIVE: 1 % (ref 0–2)
~~LOC~~ BKR LYMPHOCYTES - RELATIVE: 11 % — ABNORMAL LOW (ref 24–44)
~~LOC~~ BKR METAMYELOCYTES - RELATIVE: 6 %
~~LOC~~ BKR MONOCYTES - RELATIVE: 15 % — ABNORMAL HIGH (ref 4–12)
~~LOC~~ BKR MYELOCYTES - RELATIVE: 9 %
~~LOC~~ BKR NEUT+BANDS - ABSOLUTE: 1.5 10*3/uL — ABNORMAL LOW (ref 1.8–7.0)
~~LOC~~ BKR NEUTROPHILS - RELATIVE: 53 % (ref 41–77)
~~LOC~~ BKR NUCLEATED RBC MANUAL: 3 10*3/uL
~~LOC~~ BKR OTHER - RELATIVE: 3 %

## 2023-12-21 LAB — POC GLUCOSE
~~LOC~~ BKR POC GLUCOSE: 169 mg/dL — ABNORMAL HIGH (ref 70–100)
~~LOC~~ BKR POC GLUCOSE: 244 mg/dL — ABNORMAL HIGH (ref 70–100)
~~LOC~~ BKR POC GLUCOSE: 251 mg/dL — ABNORMAL HIGH (ref 70–100)

## 2023-12-21 LAB — MAGNESIUM: ~~LOC~~ BKR MAGNESIUM: 1.9 mg/dL — ABNORMAL LOW (ref 1.6–2.6)

## 2023-12-21 MED ORDER — TRAZODONE 50 MG PO TAB
75 mg | Freq: Every evening | ORAL | 0 refills | Status: DC
Start: 2023-12-21 — End: 2024-01-02
  Administered 2023-12-22 – 2024-01-02 (×12): 75 mg via ORAL

## 2023-12-21 NOTE — Care Plan
 Problem: Discharge Planning  Goal: Participation in plan of care  Outcome: Goal Ongoing  Goal: Knowledge regarding plan of care  Outcome: Goal Ongoing  Goal: Prepared for discharge  Outcome: Goal Ongoing     Problem: Moderate Fall Risk  Goal: Moderate Fall Risk  Outcome: Goal Ongoing     Problem: Nutrition Deficit  Goal: Adequate nutritional intake  Outcome: Goal Ongoing  Goal: Body weight within specified parameters  Outcome: Goal Ongoing     Problem: Infection, Risk of  Goal: Absence of infection  Outcome: Goal Ongoing  Goal: Knowledge of Infection Control Procedures  Outcome: Goal Ongoing     Problem: Pain  Goal: Management of pain  Outcome: Goal Ongoing  Goal: Knowledge of pain management  Outcome: Goal Ongoing  Goal: Progress Toward Pain Management Goals  Outcome: Goal Ongoing

## 2023-12-21 NOTE — Progress Notes
 PHYSICAL THERAPY  NOTE      Name: Daryl Baker   MRN: [id number]     DOB: [birth year]      Age: 68 y.o.  Admission Date: 12/08/2023     LOS: 13 days     Date of Service: 12/21/2023      Patient declined to participate despite encouragement and education about the role and benefits of Physical Therapy, states he would like pain meds prior. Patient also expresses frustration that it takes 3 people to take me to the bathroom here. Anticipating transfer to [facility] soon. Rehabilitation services will continue to follow and provide intervention as indicated.    Therapist: Harriett Rush, PT, DPT  Date: 12/21/2023

## 2023-12-21 NOTE — Progress Notes
 General Progress Note    Name:  Daryl Baker     Today's Date:  12/21/2023  Admission Date: 12/08/2023  LOS: 13 days                     Assessment/Plan:    Principal Problem:    Oropharyngeal mass  Active Problems:    Oropharyngeal cancer (CMS-HCC)    Severe malnutrition    Mouth pain    Drug-induced constipation    Encephalopathy acute      68 y.o. 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 He has had increasing pain over the last few days prior to admission near his oropharyngeal mass on the left along with the left aspect of his tongue preventing any PO intake and has a scheduled G-tube placement.    Interval Events 12/21/2023:   -Scheduled trazodone 50mg  & melatonin 10mg  qhs for sleep  -Holding tube feeds, glyburide, and metformin prior to possible MRI brain under anesthesia.      Acute metabolic encephalopathy  -Blood sugar  = normal/ elevated  -Na = wnl, Ca = 8.3  -3/17 venous pH = 7.42  -TSH = 0.39  -Hx of cisplatin 5 cycles, which can cause encephalopathy/PRES  -currently denying headache, has been stooling regularly  -Had a course of thiamine during admission  -EEG: mild encephalopathy  -MRI attempted x2 with ativan but patient was unable to tolerate  Plan:  > Consider MRI brain under anesthesia Monday  > high dose thiamine ordered  > Scheduled melatonin & trazodone increased to 50mg , continue delirium precautions    Neutropenic fever  Presumed Aspiration pneumonia  Sepsis- resolved  -tachycardia, fever, increase in WBC (leukopenic at baseline)  -source likely pulmonary given imaging findings  -UA unremarkable  -CTA Chest: Development of a few clustered nodular and patchy opacities within the   right greater than left lower lobes, probably infectious or aspiration-related.   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  Plan:  > Trulicity nonformulary, will hold inpatient  > glyburide 2.5 mg daily & metformin 500 BID for hyperglycemia via G-tube (holding 3/23 at midnight for possible MRI brian under sedation)    Dysphagia   Oropharyngeal pain  -Due to left oropharyngeal cancer  -G-tube placed 3/12 AM  -He will need the G-tube feeds for >90 days.  Plan:  >Magic mouthwash TID PRN for oropharyngeal pain  > Consulted Dietician  >Transitioned to tube feeds 3/15, formal recs in dietician note  > Palliative Care consult for pain management as above.   -Fentanyl patch 17mcg/hr  -Oxycodone 15mg  q4h PRN  -Dilaudid 0.5-1mg  IV q2h PRN  -Gabapentin 300mg  TID  -Bowel regimen with senokot-s 2ts BID, miralax BID  >Fluconazole 400mg  qday for oral candidiasis     Left HPV-Mediated Oropharyngeal SCC  -Currently on chemo and radiation  -Follow in Oncology clinic with dr Neita Carp and radiation Oncology Dr Mariane Masters  -CT Neck revealed:  1.  Marked decrease size of the prior large left oropharyngeal mass. There   is a residual 1.7 x 1.5 cm  lesion involving the left tonsil with central   fluid attenuation. Treated necrotic tumor is the leading consideration. A   post therapeutic retention cyst could appear similar. An abscess is   thought less likely though direct visualization and clinical correlation   is recommended regarding symptoms of infection.   2.  Improvement of bilateral cervical lymphadenopathy.   Plan:  > Radiation Oncology with continue to perform radiation while inpatient.(3/17-3/19)  > Triamcinolone cream for radiation induced dermatitis    Pancytopenia   Neutropenia  -ANC <1  -likely due to Chemo & radiation  -follow with daily CBC w/ Diff     Hypertension  >Continue to hold metoprolol & losartan    FEN:  Bolus tube feeds with free water flushes/Replace electrolytes as needed/Tube feeds  DIET CLEAR LIQUID w/ Body mass index is 31.66 kg/m?Marland Kitchen   DVT PPX:  40mg  qday enoxaparin, SCDs bilat  Code status: Full Code     Patient seen and case discussed with Dr. Lonell Grandchild, MD   Internal Medicine-Psychiatry Resident, PGY1  Available on [employer]   ________________________________________________________________________    Subjective  Patient seen and examined this morning at bedside. Remains encephalopathic. He is A&O x2 and cannot spell world backwards. But, he is sitting up & alert in bed. He is unable to recall why he was admitted and continues to make bizarre statements. He is speaking much more than previously, which demonstrates that his oral pain has improved. He and his wife express concern regarding him being intubated for the MRI as it may cause more throat pain.    Medications  Scheduled Meds:Diet Enteral Feeding Bolus, , SEE ADMIN INSTRUCTIONS, 5XDAY (6,10,14,18,22   And  Water Bolus, 100 mL, PEG Tube, 5XDAY (6,10,14,18,22  enoxaparin (LOVENOX) syringe 40 mg, 40 mg, Subcutaneous, QDAY(21)  fentaNYL (DURAGESIC) 50 mcg/hr patch 1 patch, 1 patch, Transdermal, Q72H*   And  Verification of Patch Placement and Integrity - fentaNYL 50 mcg/hr, , Transdermal, BID  fluconazole (DIFLUCAN) oral suspension 200 mg, 200 mg, Oral, QDAY  gabapentin (NEURONTIN) capsule 300 mg, 300 mg, Per G Tube, TID  glyBURIDE (DIABETA) tablet 2.5 mg, 2.5 mg, Oral, QDAY  melatonin tablet 10 mg, 10 mg, Oral, QHS  metFORMIN (GLUCOPHAGE) tablet 500 mg, 500 mg, Oral, BID w/meals  piperacillin/tazobactam (ZOSYN) 4.5 g in sodium chloride 0.9% (NS) 100 mL IVPB (MB+)(EXTENDED INFUSION), 4.5 g, Intravenous, Q6H*  polyethylene glycol 3350 (MIRALAX) packet 17 g, 1 packet, Per G Tube, QDAY  sennosides-docusate sodium (SENOKOT-S) tablet 2 tablet, 2 tablet, Per G Tube, QDAY  thiamine hcl (vitamin B1) injection 500 mg, 500 mg, Intravenous, Q8H  traZODone (DESYREL) tablet 50 mg, 50 mg, Oral, QHS  triamcinolone acetonide (KENALOG) 0.1 % topical cream 1 g, 1 g, Topical, BID    Continuous Infusions:      PRN and Respiratory Meds:acetaminophen Q6H PRN, bisacodyL QDAY PRN, dextrose 50% (D50) IV PRN, diphenhydrAMINE/lidocaine/mag,al-simeth(#) TID PRN, HYDROmorphone (DILAUDID) injection Q2H PRN, ondansetron Q6H PRN **OR** ondansetron (ZOFRAN) IV Q6H PRN, oxyCODONE Q3H PRN, pancrelipase 20,880 Units/sodium bicarbonate 650 mg ([place] CLOG DESTROYER) PRN (On Call from Rx), sodium chloride PRN        Objective:                          Vital Signs: Last Filed                 Vital Signs: 24 Hour Range   BP: 147/69 (03/23 1131)  Temp: 37 ?C (98.6 ?F) (03/23 1131)  Pulse: 93 (03/23 1131)  Respirations: 18 PER MINUTE (03/23 1131)  SpO2: 98 % (03/23 1131)  O2 Device: None (Room air) (03/23 1131) BP: (109-149)/(48-69)   Temp:  [36.6 ?C (97.8 ?F)-37 ?C (98.6 ?F)]   Pulse:  [89-106]   Respirations:  [18 PER MINUTE]   SpO2:  [94 %-98 %]   O2 Device: None (Room air)   Intensity Pain Scale (Self Report): 9 (12/20/23 2230) Vitals:    12/08/23 0907 12/08/23 1445 12/09/23 1715   Weight: 102 kg (224 lb 13.9 oz) 101.5 kg (223 lb 12.8 oz) 101.5 kg (223 lb 12.8 oz)       Intake/Output Summary:  (Last 24 hours)    Intake/Output Summary (Last 24 hours) at 12/21/2023 1153  Last data filed at 12/21/2023 0749  Gross per 24 hour   Intake 1766 ml   Output 1275 ml   Net 491 ml           Physical Exam  Constitutional:       General: He is not in acute distress.     Appearance: Normal appearance.   HENT:      Head: Normocephalic and atraumatic.      Nose: Nose normal.   Eyes:      Conjunctiva/sclera: Conjunctivae normal.   Cardiovascular:      Rate and Rhythm: Normal rate and regular rhythm.      Heart sounds: Normal heart sounds. No murmur heard.  Pulmonary:      Effort: Pulmonary effort is normal. No respiratory distress.      Breath sounds: Normal breath sounds.   Skin: General: Skin is warm and dry.   Neurological:      Mental Status: He is alert. He is disoriented.      Comments: Remains encephalopathic              Point of Care Testing  (Last 24 hours)  Glucose: (!) 158 (12/21/23 0408)  POC Glucose (Download): (!) 251 (12/21/23 1114)    Radiology and other Diagnostics Review:    CT NECK W/CONTRAST  Result Date: 12/08/2023  1.  Marked decrease size of the prior large left oropharyngeal mass. There is a residual 1.7 x 1.5 cm lesion involving the left tonsil with central fluid attenuation. Treated necrotic tumor is the leading consideration. A post therapeutic retention cyst could appear similar. An abscess is thought less likely though direct visualization and clinical correlation is recommended regarding symptoms of infection. 2.  Improvement of bilateral cervical lymphadenopathy. By my electronic signature, I attest that I have personally reviewed the images for this examination and formulated the interpretations and opinions expressed in this report  Finalized by Marily Memos, M.D. on 12/08/2023 12:00 PM. Dictated by Jerene Pitch, MD on 12/08/2023 11:07 AM.    CT HEAD WO CONTRAST  Result Date: 12/08/2023  1.  No acute intracranial hemorrhage or mass effect. 2.  Mild patchy cerebral white matter hypodensities, likely sequelae of chronic microvascular ischemia. 3.  The CT neck is dictated separately. Please see that report. By my electronic signature, I attest that I have personally reviewed the images for this examination and formulated the interpretations and opinions expressed in this report  Finalized by Marily Memos, M.D. on 12/08/2023 12:00 PM. Dictated by Jerene Pitch, MD on 12/08/2023 10:51 AM.

## 2023-12-22 ENCOUNTER — Inpatient Hospital Stay: Admit: 2023-12-22 | Discharge: 2023-12-22 | Payer: MEDICARE

## 2023-12-22 ENCOUNTER — Encounter: Admit: 2023-12-22 | Discharge: 2023-12-22 | Payer: MEDICARE

## 2023-12-22 LAB — LIPID PROFILE
~~LOC~~ BKR CHOLESTEROL: 68 mg/dL (ref <200)
~~LOC~~ BKR HDL: 32 mg/dL — ABNORMAL LOW (ref >40)
~~LOC~~ BKR LDL: 32 mg/dL (ref <100)
~~LOC~~ BKR NON HDL CHOLESTEROL: 36 mg/dL
~~LOC~~ BKR TRIGLYCERIDES: 98 mg/dL (ref <150)
~~LOC~~ BKR VLDL: 19 mg/dL

## 2023-12-22 LAB — POC GLUCOSE
~~LOC~~ BKR POC GLUCOSE: 112 mg/dL — ABNORMAL HIGH (ref 70–100)
~~LOC~~ BKR POC GLUCOSE: 136 mg/dL — ABNORMAL HIGH (ref 70–100)
~~LOC~~ BKR POC GLUCOSE: 194 mg/dL — ABNORMAL HIGH (ref 70–100)

## 2023-12-22 LAB — MANUAL DIFF
~~LOC~~ BKR LYMPHOCYTES - RELATIVE: 15 % — ABNORMAL LOW (ref 24–44)
~~LOC~~ BKR METAMYELOCYTES - RELATIVE: 5 %
~~LOC~~ BKR MONOCYTES - RELATIVE: 23 % — ABNORMAL HIGH (ref 4–12)
~~LOC~~ BKR MYELOCYTES - RELATIVE: 4 %
~~LOC~~ BKR NEUT+BANDS - ABSOLUTE: 1.7 10*3/uL — ABNORMAL LOW (ref 1.8–7.0)
~~LOC~~ BKR NEUTROPHILS - RELATIVE: 48 % (ref 41–77)
~~LOC~~ BKR OTHER - RELATIVE: 5 %

## 2023-12-22 LAB — GLUCOSE-CSF: ~~LOC~~ BKR CSF GLUCOSE: 68 mg/dL (ref 40–75)

## 2023-12-22 LAB — PERIPHERAL SMEAR

## 2023-12-22 LAB — TOTAL PROTEIN-CSF: ~~LOC~~ BKR CSF TOTAL PROTEIN: 70 mg/dL — ABNORMAL HIGH (ref 15–45)

## 2023-12-22 MED ORDER — PROPOFOL INJ 10 MG/ML IV VIAL
INTRAVENOUS | 0 refills | Status: DC
Start: 2023-12-22 — End: 2023-12-22

## 2023-12-22 MED ORDER — LACTATED RINGERS IV SOLP
INTRAVENOUS | 0 refills | Status: DC
Start: 2023-12-22 — End: 2023-12-22

## 2023-12-22 MED ORDER — ENOXAPARIN 40 MG/0.4 ML SC SYRG
40 mg | Freq: Every day | SUBCUTANEOUS | 0 refills | Status: DC
Start: 2023-12-22 — End: 2024-01-02
  Administered 2023-12-24 – 2024-01-02 (×10): 40 mg via SUBCUTANEOUS

## 2023-12-22 MED ORDER — HYDROMORPHONE (PF) 2 MG/ML IJ SYRG
1 mg | INTRAVENOUS | 0 refills | Status: DC | PRN
Start: 2023-12-22 — End: 2023-12-22

## 2023-12-22 MED ORDER — GLYBURIDE 5 MG PO TAB
2.5 mg | Freq: Every day | ORAL | 0 refills | Status: DC
Start: 2023-12-22 — End: 2023-12-31
  Administered 2023-12-23 – 2023-12-29 (×5): 2.5 mg via ORAL

## 2023-12-22 MED ORDER — PROPOFOL 10 MG/ML IV EMUL 50 ML (INFUSION)(AM)(OR)
INTRAVENOUS | 0 refills | Status: DC
Start: 2023-12-22 — End: 2023-12-22
  Administered 2023-12-22: 20:00:00 100 ug/kg/min via INTRAVENOUS

## 2023-12-22 MED ORDER — FENTANYL CITRATE (PF) 50 MCG/ML IJ SOLN
INTRAVENOUS | 0 refills | Status: DC
Start: 2023-12-22 — End: 2023-12-22

## 2023-12-22 MED ORDER — GLYBURIDE 5 MG PO TAB
2.5 mg | Freq: Every day | ORAL | 0 refills | Status: DC
Start: 2023-12-22 — End: 2023-12-23

## 2023-12-22 MED ORDER — METFORMIN 500 MG PO TAB
500 mg | Freq: Two times a day (BID) | ORAL | 0 refills | Status: DC
Start: 2023-12-22 — End: 2024-01-02
  Administered 2023-12-23 – 2024-01-02 (×17): 500 mg via ORAL

## 2023-12-22 MED ORDER — OXYCODONE 5 MG PO TAB
5-10 mg | Freq: Once | ORAL | 0 refills | Status: DC | PRN
Start: 2023-12-22 — End: 2023-12-22

## 2023-12-22 MED ORDER — GADOBENATE DIMEGLUMINE 529 MG/ML (0.1MMOL/0.2ML) IV SOLN
20 mL | Freq: Once | INTRAVENOUS | 0 refills | Status: CP
Start: 2023-12-22 — End: ?
  Administered 2023-12-22: 19:00:00 20 mL via INTRAVENOUS

## 2023-12-22 MED ORDER — METOCLOPRAMIDE HCL 5 MG/ML IJ SOLN
10 mg | Freq: Once | INTRAVENOUS | 0 refills | Status: DC | PRN
Start: 2023-12-22 — End: 2023-12-22

## 2023-12-22 MED ORDER — HALOPERIDOL LACTATE 5 MG/ML IJ SOLN
1 mg | Freq: Once | INTRAVENOUS | 0 refills | Status: DC | PRN
Start: 2023-12-22 — End: 2023-12-22

## 2023-12-22 MED ORDER — FENTANYL CITRATE (PF) 50 MCG/ML IJ SOLN
25 ug | INTRAVENOUS | 0 refills | Status: DC | PRN
Start: 2023-12-22 — End: 2023-12-22

## 2023-12-22 MED ORDER — ONDANSETRON HCL (PF) 4 MG/2 ML IJ SOLN
4 mg | Freq: Once | INTRAVENOUS | 0 refills | Status: DC | PRN
Start: 2023-12-22 — End: 2023-12-22

## 2023-12-22 MED ORDER — HYDROMORPHONE (PF) 2 MG/ML IJ SYRG
.5 mg | INTRAVENOUS | 0 refills | Status: DC | PRN
Start: 2023-12-22 — End: 2023-12-22

## 2023-12-22 MED ADMIN — PHENYLEPHRINE HCL IN 0.9% NACL 20 MG/250 ML (80 MCG/ML) IV SOLN (STD CONC) [309174]: .3 ug/kg/min | INTRAVENOUS | @ 19:00:00 | Stop: 2023-12-22 | NDC 70004080840

## 2023-12-22 NOTE — Progress Notes
 CLINICAL NUTRITION                                                        Clinical Nutrition Follow-Up Assessment     Name: Daryl Baker   MRN: [id number]     DOB: Jan 03, 1956      Age: 68 y.o.  Admission Date: 12/08/2023     LOS: 14 days     Date of Service: 12/22/2023        Recommendation:  Least restrictive PO diet per SLP/primary team recs.     Continue bolus feeds of TwoCal HN, total of 5.5 cartons daily.   1 carton (237 mL) x 4 feeds per day + 1.5 cartons (~356 mL) once daily.  This provides 2613 kcal/day, 109 g protein/day, and 913 mL free water/day.   At least 30mL free water flush before and after each bolus feed. Additional fluids per primary team recs.     Comments:  Clinical nutrition following for EN management. Received 5 feeds on 3/22 and 3/23, per review of MAR (amt of TF given not documented for all feeds on 3/23 so difficult to calculate EN avg). TF held today for possible MRI brain under anesthesia. Oriented x1 today, per EMR. RD visited pt today for f/u. Denied n/v, abd pain/discomfort with feeds. Reports issues with constipation, last BM being 1 month ago- RD questioning accuracy. Pt did not have nutrition related questions/concerns for RD. Spoke to RN after visit- no issues/concerns with TF. Per EMR, pt's last BM was today (x2, soft/loose), bowel regimen on board. Current TF recs remain appropriate. RD will continue to follow.     Meds- liquid tylenol, glyburide, metformin, oxycodone, miralax, senna, thiamine. Labs- 134 Na, 112-251 glucose POC.                            Nutrition Assessment of Patient:  Admit Weight: 102 kg; Weight Change Since Admit: Minimal changes/no new wt since 3/18; Desired Weight: 79.9 kg  BMI (Calculated): 31.66;    Pertinent Allergies/Intolerances: none per EMR      Oral Diet Order: Clear liquid;    Current EN Order: Bolus feeds- TwoCal HN, 5.5 cartons/day  Current Energy Intake: Inadequate  Intake Comment:  (Amt of TF given not documneted for all feeds on 3/23 so difficult to calculate EN avg)  Weight Used for Calculation: 79.9 kg  Estimated Calorie Needs: 2395-2795 kcal (30-35 kcal/kg DBW)  Estimated Protein Needs: 104-120 g protein (1.3-1.5 g protein/kg DBW)    Malnutrition Assessment:  Malnutrition present on admission  ICD-10 code E43: Chronic illness/Severe malnutrition        Energy intake: 75% or less of estimated energy requirement for 1 month or more, Weight loss: Greater than 20% x 1 year          Malnutrition Interventions: Monitor TF adequacy, tolerance    Nutrition Focused Physical Exam:  Loss of Subcutaneous Fat: Yes; Severity: Moderate; Location: Triceps  Muscle Wasting: Yes; Severity: Mild; Location: [place], Deltoid    Physical Assessment:  RLE Edema: Pitting 1+  LLE Edema: Pitting 1+     Pressure Injury: stage 2 buttocks (x2)     Comment: +BM 3/24 (x2, loose/soft) per EMR    Nutrition Diagnosis:  Inadequate oral intake  Etiology: mouth  pain, PO intake intolerance, poor appetite  Signs & Symptoms: pt report per chart review, wife report, need for G-tube placement and EN recs    Intervention / Plan:  Monitor PO intake, wt trends, labs, I/Os      Tiffanee Mcnee, MCN, RDN, LD  Available on [employer]

## 2023-12-22 NOTE — Unmapped
 Immediate Post Procedure Note    Date:  12/22/2023                                           Attending Physician:   Benn Moulder  Assistant(s):  Arlys John    Procedure(s):  LP  Indications:  AMS  Findings:  above  Anesthesia: Local 5 mL 1% lidocaine without epinephrine  Sedation/Medication Plan: MAC (Monitored Anesthesia Care)    Time out performed: Consent obtained, correct patient verified, correct procedure verified, correct site verified, patient marked as necessary.  Estimated Blood Loss:  None/Negligible  Specimen(s) Removed/Disposition:  Yes, sent to pathology  Complications: None  Comments:    Marland Kitchen, MD

## 2023-12-22 NOTE — Progress Notes
 OCCUPATIONAL THERAPY  NOTE   Name: Daryl Baker   MRN: [id number]     DOB: May 10, 1956      Age: 68 y.o.  Admission Date: 12/08/2023     LOS: 14 days     Date of Service: 12/22/2023      Patient was unavailable for occupational therapy, off unit for LP.  Occupational therapy will continue to follow and provide intervention as indicated.      Therapist: Laurelyn Sickle, OTR/L [id number]  Date: 12/22/2023

## 2023-12-22 NOTE — Anesthesia Pre-Procedure Evaluation
 Anesthesia Pre-Procedure Evaluation    Name: Daryl Baker      MRN: [id number]     DOB: 27-Nov-1955     Age: 68 y.o.     Sex: male   _________________________________________________________________________     Procedure Info:   Procedure Information       Date/Time: 12/22/23 1430    Scheduled providers: [name], MD; Eliezer Lofts, RN; Antonieta Iba, RT(R)(VI),LRT    Procedure: IR LUMBAR PUNCTURE    Location: Interventional Radiology: [name]            Physical Assessment  Vital Signs (last filed in past 24 hours):  BP: 129/71 (03/24 0742)  Temp: 36.7 ?C (98 ?F) (03/24 0742)  Pulse: 102 (03/24 0742)  Respirations: 20 PER MINUTE (03/24 0742)  SpO2: 92 % (03/24 0742)  O2 Device: None (Room air) (03/24 0742)     Glucose, POC   Date/Time Value Ref Range Status   12/22/2023 1123 136 (H) 70 - 100 mg/dL Final      Patient History   No Known Allergies     Current Medications    Medication Directions   dexAMETHasone (DECADRON) 4 mg tablet Beginning 24 hours after each Cisplatin infusion, take 2 tablets by mouth daily for 3 days.   fentaNYL (DURAGESIC) 12 mcg/hr patch Apply one patch to top of skin as directed every 72 hours. Indications: severe chronic pain with opioid tolerance  Use with patch   fentaNYL (DURAGESIC) 25 mcg/hr patch Apply one patch to top of skin as directed every 72 hours. Indications: Cancer-related Oropharyngeal pain   FREESTYLE LIBRE 2 READER reader Use one each as directed every 6 hours.   lidocaine hcl viscous (LIDOCAINE VISCOUS) 2 % solution Swish and Spit 5 mL by mouth as directed four times daily as needed.   losartan (COZAAR) 50 mg tablet Take two tablets by mouth daily.   metFORMIN-XR (GLUCOPHAGE XR) 750 mg extended release tablet Take one tablet by mouth daily with dinner.   metoprolol succinate XL (TOPROL XL) 100 mg extended release tablet Take one tablet by mouth daily.   morphine SR (MS CONTIN) 15 mg tablet Take one tablet by mouth every 12 hours.   morphine SR (MS CONTIN) 30 mg ER tablet Take one tablet by mouth every 12 hours for 30 days. Indications: severe chronic pain requiring long-term opioid treatment  Dose adjustment requiring early fill   OLANZapine (ZYPREXA) 5 mg tablet Take 1 tablet by mouth daily at bedtime for 4 days starting on Cisplatin infusion day.  Indications: prevent nausea and vomiting from cancer chemotherapy   ondansetron HCL (ZOFRAN) 8 mg tablet Take one tablet by mouth every 8 hours as needed (nausea and vomiting). Indications: prevent nausea and vomiting from cancer chemotherapy   oxyCODONE (ROXICODONE) 5 mg tablet Take one tablet to two tablets by mouth every 4-6 hours as needed for Pain. Indications: pain   triamcinolone acetonide (TRIDERM) 0.1 % topical cream Apply one g topically to affected area twice daily.   TRULICITY 3 mg/0.5 mL injection pen Inject 0.5 mL under the skin every 7 days. 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    Abdominal Findings:       Obese    Neurological Findings:         Comments: Agitated  Restless  Delirium  Confusion, able to answer some questions       Previous Airway Procedure Notes Displaying the 3 most recent records      Date Mask Difficulty Techninque used for succesful ETT placement Blade Type Blade Size View with successful placement Number of attempts    12/22/23 1 - vent by mask Not documented Not documented Not documented Not documented 1            Patient Lines/Drains/Airways Status       Active Lines:       Name Placement date Placement time Site Days    Implantable Port-a-cath Single Subclavian, right 10/13/23  1126  -- 70    Long Term GI Tube Gastrostomy/PEG Abdomen, left upper 18 FR 12/10/23  0904  -- 12                  Diagnostic Tests  Hematology:   Lab Results   Component Value Date    HGB 7.8 (L) 12/22/2023    HCT 21.8 (L) 12/22/2023    PLTCT 367 12/22/2023    WBC 3.60 (L) 12/22/2023    NEUT 60.0 12/18/2023    ANC 1.7 (L) 12/22/2023    LYMPH 15 (L) 12/22/2023    ALC 0.20 (L) 12/18/2023    MONA 23.4 (H) 12/18/2023    AMC 0.20 12/18/2023    EOSA 0.5 12/18/2023    ABC 0.00 12/18/2023    BASOPHILS 1 12/21/2023    MCV 94.2 12/22/2023    MCH 33.6 12/22/2023    MCHC 35.7 12/22/2023    MPV 8.4 12/22/2023    RDW 16.9 (H) 12/22/2023         General Chemistry:   Lab Results   Component Value Date    NA 134 (L) 12/22/2023    K 4.3 12/22/2023    CL 100 12/22/2023    CO2 28 12/22/2023    GAP 6 12/22/2023    BUN 11 12/22/2023    CR 0.58 12/22/2023    GLU 90 12/22/2023    CA 7.4 (  L) 12/22/2023    ALBUMIN 2.7 (L) 12/22/2023    LACTIC 1.8 12/15/2023    MG 1.9 12/22/2023    TOTBILI 0.3 12/22/2023    PO4 3.9 12/15/2023      Coagulation:   Lab Results   Component Value Date    PT 11.4 12/05/2023    INR 1.0 12/05/2023       PAC Plan    Anesthesia Plan    ASA score: 4   Plan: MAC  NPO status: acceptable      Informed Consent  Anesthetic plan and risks discussed with patient and spouse.        Plan discussed with: anesthesiologist and CRNA.  Comments: (Daryl Baker just received general LMA for MRI today  He will recover in pre-post IR and if he is looking well I will call his wife and let her know that he will go to IR for LP under MAC)      Alerts

## 2023-12-22 NOTE — Consults
 Neurology Consult/H&P Note      Admission Date: 12/08/2023                                                LOS: 14 days    Reason for Consult:  bilateral scattered infarcts and AMS     Date of service: 12/22/23     Consult type: Co-Management w/Signed Orders    Assessment: 68 y.o. male with bilateral scattered infarcts and AMS     Diagnostic Studies    MRI Brain (with and without contrast, [date]):  Scattered small acute to early subacute bilateral cerebral white matter infarcts, predominantly in the bilateral corona radiata, centrum semiovale, and periatrial regions.  Additional small subcortical infarct at the junction of the medial right precentral gyrus and superior frontal gyrus (series 4, image 26).  Stable mild generalized cerebral volume loss with prominence of ventricles and extra-axial spaces.  Mild patchy cerebral white matter FLAIR hyperintensities.  No abnormal enhancing intracranial lesion, midline shift, or mass effect.  Major intracranial arterial flow voids preserved.  Minimal left maxillary alveolar recess mucosal thickening and mild layering pharyngeal secretions with a partially visualized indwelling support device.  EEG ([date]): Mild encephalopathy, no epileptiform activity.  CT Neck (12/08/23): Marked decrease in size of left oropharyngeal mass; residual 1.7 x 1.5 cm lesion in left tonsil, likely treated necrotic tumor.  CT Head (12/08/23): No acute intracranial hemorrhage or mass effect; mild patchy white matter hypodensities consistent with chronic microvascular ischemia.      Problem List:  Principal Problem:    Oropharyngeal mass  Active Problems:    Oropharyngeal cancer (CMS-HCC)    Severe malnutrition    Mouth pain    Drug-induced constipation    Encephalopathy acute              Impression:    Daryl Baker is a 68 year old male with a history of oropharyngeal cancer, hypertension, and diabetes, presenting with AMS and MRI evidence of multiple punctate bilateral hemisphere strokes. 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 The bilateral distribution suggests a possible cardiac or proximal arterial embolic source. His neurological condition is multifactorial:    Acute and Subacute Ischemic Strokes:  MRI shows multiple small bilateral white matter infarcts and a right frontal subcortical infarct, consistent with watershed or border zone strokes.  Potential mechanisms include hypoperfusion (e.g., cardiac dysfunction, hypotension) or embolic phenomena (cardiac or arterial source).    Encephalopathy:  Likely multifactorial, including medication effects (opioids: fentanyl, oxycodone, hydromorphone; gabapentin) and delirium exacerbated by pain, sleep deprivation, or hospitalization.  Prior cisplatin use is noted, but MRI does not support posterior reversible encephalopathy syndrome (PRES).    Mental Status:  Significantly improved since the last assessment, though he remains disoriented to time and situation and occasionally makes bizarre statements.    Recommendation:      Follow pending CSF workups - prelim labs negative     TEE - due to history of cancer. Stroke likely from watersheds/border zone infarcts and less likely embolic or combination of both     telemetry monitoring to assess for atrial fibrillation or other arrhythmias.    Obtain lipid panel and HbA1c to optimize vascular risk factors.    Start aspirin 81 mg daily for secondary stroke prevention, pending further workup.  Optimize BP control (goal <130/80 mmHg) and resume metformin/glyburide post-MRI if appropriate.      Continue delirium precautions (e.g., reorientation, quiet environment).  Optimize sleep with trazodone 50 mg qhs and melatonin 10 mg qhs; consider further titration if ineffective.    Minimize opioids and sedatives as pain stabilizes; explore non-opioid alternatives with palliative care.      Neurology Swing Consult Service:  > I can be contacted for patient questions on Voalte between 10AM and 11PM.  > Urgent questions (prior to 10AM) can otherwise be directed to the Neurology teaching service at pager [phone].   > Calls to the Neurology swing pager [phone]), will not be returned prior to 11AM.    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 He completed 26 sessions of chemoradiation, including five cycles of cisplatin, and was admitted on December 08, 2023, for severe oropharyngeal pain and dysphagia requiring G-tube placement. During his hospitalization, he developed progressive altered mental status (AMS), characterized as encephalopathic with waxing and waning confusion. He is currently alert and talkative but disoriented to time and situation, unable to recall his reason for admission, and occasionally makes bizarre statements. His wife reports concerns about potential intubation for an MRI due to exacerbation of his oropharyngeal pain.    Neurology was consulted following an MRI of the brain that revealed scattered small acute to early subacute bilateral cerebral white matter infarcts, predominantly in the bilateral corona radiata, centrum semiovale, and periatrial regions, with an additional small subcortical infarct at the junction of the medial right precentral gyrus and superior frontal gyrus. His AMS was first noted during this admission, with an EEG showing mild encephalopathy. Two attempts to obtain an MRI with sedation (lorazepam) were unsuccessful due to patient intolerance. His pain is managed with fentanyl, oxycodone, hydromorphone, and gabapentin, with recent improvement, though his mental status remains impaired. He has received thiamine supplementation, and metabolic workup has been unremarkable.      Past Medical History:    DM (diabetes mellitus) (CMS-HCC)    Hyperlipidemia    Hypertension    Neurogenic amyotrophy    Neuropathy       Surgical History:   Procedure Laterality Date    ABDOMINAL HERNIA REPAIR      KNEE ARTHROSCOPY Left        Social History     Tobacco Use    Smoking status: Former     Current packs/day: 1.00     Types: Cigarettes     Passive exposure: Past    Smokeless tobacco: Never   Vaping Use    Vaping status: Never Used   Substance Use Topics    Alcohol use: Yes     Alcohol/week: 1.0 standard drink of alcohol     Types: 1 Shots of liquor per week    Drug use: Never       Family History   Problem Relation Name Age of Onset    Cancer Mother      Diabetes Mother      Hypertension Mother      Cancer-Lung Mother      Stroke Father      Huntington's Chorea Maternal Grandfather         Allergies:  Patient has no known allergies.    Scheduled Meds:Diet Enteral Feeding Bolus, , SEE ADMIN INSTRUCTIONS, 5XDAY (6,10,14,18,22   And  Water Bolus, 100 mL, PEG Tube, 5XDAY (6,10,14,18,22  enoxaparin (LOVENOX) syringe 40 mg, 40 mg, Subcutaneous, QDAY(21)  fentaNYL (DURAGESIC) 50 mcg/hr patch 1 patch, 1 patch, Transdermal, Q72H*   And  Verification of Patch Placement and Integrity - fentaNYL 50 mcg/hr, , Transdermal, BID  fluconazole (DIFLUCAN) oral suspension 200 mg, 200 mg, Oral, QDAY  gabapentin (NEURONTIN) capsule 300 mg, 300 mg, Per G Tube, TID  [Held by Provider] glyBURIDE (DIABETA) tablet 2.5 mg, 2.5 mg, Oral, QDAY  melatonin tablet 10 mg, 10 mg, Oral, QHS  [Held by Provider] metFORMIN (GLUCOPHAGE) tablet 500 mg, 500 mg, Oral, BID w/meals  polyethylene glycol 3350 (MIRALAX) packet 17 g, 1 packet, Per G Tube, QDAY  sennosides-docusate sodium (SENOKOT-S) tablet 2 tablet, 2 tablet, Per G Tube, QDAY  thiamine hcl (vitamin B1) injection 500 mg, 500 mg, Intravenous, Q8H  traZODone (DESYREL) tablet 75 mg, 75 mg, Oral, QHS  triamcinolone acetonide (KENALOG) 0.1 % topical cream 1 g, 1 g, Topical, BID    Continuous Infusions:  PRN and Respiratory Meds:acetaminophen Q6H PRN, bisacodyL QDAY PRN, dextrose 50% (D50) IV PRN, diphenhydrAMINE/lidocaine/mag,al-simeth(#) TID PRN, HYDROmorphone (DILAUDID) injection Q2H PRN, ondansetron Q6H PRN **OR** ondansetron (ZOFRAN) IV Q6H PRN, oxyCODONE Q3H PRN, pancrelipase 20,880 Units/sodium bicarbonate 650 mg ([facility] CLOG DESTROYER) PRN (On Call from Rx), sodium chloride PRN    No current facility-administered medications on file prior to encounter.     Current Outpatient Medications on File Prior to Encounter   Medication Sig Dispense Refill    dexAMETHasone (DECADRON) 4 mg tablet Beginning 24 hours after each Cisplatin infusion, take 2 tablets by mouth daily for 3 days. 36 tablet 0    FREESTYLE LIBRE 2 READER reader Use one each as directed every 6 hours.      lidocaine hcl viscous (LIDOCAINE VISCOUS) 2 % solution Swish and Spit 5 mL by mouth as directed four times daily as needed. 100 mL 3    losartan (COZAAR) 50 mg tablet Take two tablets by mouth daily.      metFORMIN-XR (GLUCOPHAGE XR) 750 mg extended release tablet Take one tablet by mouth daily with dinner.      metoprolol succinate XL (TOPROL XL) 100 mg extended release tablet Take one tablet by mouth daily.      morphine SR (MS CONTIN) 15 mg tablet Take one tablet by mouth every 12 hours.      morphine SR (MS CONTIN) 30 mg ER tablet Take one tablet by mouth every 12 hours for 30 days. Indications: severe chronic pain requiring long-term opioid treatment  Dose adjustment requiring early fill 60 tablet 0    OLANZapine (ZYPREXA) 5 mg tablet Take 1 tablet by mouth daily at bedtime for 4 days starting on Cisplatin infusion day.  Indications: prevent nausea and vomiting from cancer chemotherapy 24 tablet 0    ondansetron HCL (ZOFRAN) 8 mg tablet Take one tablet by mouth every 8 hours as needed (nausea and vomiting). Indications: prevent nausea and vomiting from cancer chemotherapy 90 tablet 0    oxyCODONE (ROXICODONE) 5 mg tablet Take one tablet to two tablets by mouth every 4-6 hours as needed for Pain. Indications: pain 120 tablet 0    triamcinolone acetonide (TRIDERM) 0.1 % topical cream Apply one g topically to affected area twice daily. 80 g 3    TRULICITY 3 mg/0.5 mL injection pen Inject 0.5 mL  under the skin every 7 days. Sunday         Review of Systems:  Review of systems not obtained from patient due to patient factors.      Vital Signs:  Last Filed in 24 hours Vital Signs:  24 hour Range    BP: 123/75 (03/24 1630)  Temp: 36.9 ?C (98.4 ?F) (03/24 1542)  Pulse: 97 (03/24 1630)  Respirations: 20 PER MINUTE (03/24 0742)  SpO2: 92 % (03/24 1630)  O2 Device: Nasal cannula (03/24 1630)  O2 Liter Flow: 2 Lpm (03/24 1630) BP: (106-153)/(47-86)   Temp:  [36.7 ?C (98 ?F)-37.3 ?C (99.2 ?F)]   Pulse:  [97-112]   Respirations:  [20 PER MINUTE]   SpO2:  [92 %-97 %]   O2 Device: Nasal cannula  O2 Liter Flow: 2 Lpm       General physical exam:    General: alert, oriented x 3  Speech: normal, no aphasia, no dysarthria   Cranial nerves: II Visual fields full to finger counting; III, IV, VI PERRL, extraocular muscles intact, no nystagmus; V facial sensation intact; VII facial expression symmetric; VIII hearing intact to conversation; IX, X palate rise symmetric; XI sternocleidomastoid strength intact; XII tongue midline, no fasciculations present  Motor: (Right/Left) No drift   Sensory: Normal to light touch.   Coordination: normal finger nose finger  Abnormal Movements: no tremors, no rigidity, no bradykinesia  Gait: deferred       Lab/Radiology/Other Diagnostic Tests:  24-hour labs:    Results for orders placed or performed during the hospital encounter of 12/08/23 (from the past 24 hours)   CBC AND DIFF    Collection Time: 12/22/23  6:48 AM   Result Value Ref Range    White Blood Cells 3.60 (L) 4.50 - 11.00 10*3/uL    Red Blood Cells 2.32 (L) 4.40 - 5.50 10*6/uL    Hemoglobin 7.8 (L) 13.5 - 16.5 g/dL    Hematocrit 16.1 (L) 40.0 - 50.0 %    MCV 94.2 80.0 - 100.0 fL    MCH 33.6 26.0 - 34.0 pg    MCHC 35.7 32.0 - 36.0 g/dL    RDW 09.6 (H) 04.5 - 15.0 %    Platelet Count 367 150 - 400 10*3/uL    MPV 8.4 7.0 - 11.0 fL   COMPREHENSIVE METABOLIC PANEL    Collection Time: 12/22/23  6:48 AM   Result Value Ref Range    Sodium 134 (L) 137 - 147 mmol/L    Potassium 4.3 3.5 - 5.1 mmol/L    Chloride 100 98 - 110 mmol/L    Glucose 90 70 - 100 mg/dL    Blood Urea Nitrogen 11 7 - 25 mg/dL    Creatinine 4.09 8.11 - 1.24 mg/dL    Calcium 7.4 (L) 8.5 - 10.6 mg/dL    Total Protein 5.3 (L) 6.0 - 8.0 g/dL    Total Bilirubin 0.3 0.2 - 1.3 mg/dL    Albumin 2.7 (L) 3.5 - 5.0 g/dL    Alk Phosphatase 40 25 - 110 U/L    AST 14 7 - 40 U/L    ALT 9 7 - 56 U/L    CO2 28 21 - 30 mmol/L    Anion Gap 6 3 - 12    Glomerular Filtration Rate (GFR) >60 >60 mL/min   MAGNESIUM    Collection Time: 12/22/23  6:48 AM   Result Value Ref Range    Magnesium 1.9 1.6 - 2.6 mg/dL   MANUAL  DIFF    Collection Time: 12/22/23  6:48 AM   Result Value Ref Range    ANISO Present     Platelet Estimate Normal     Segmented Neutrophils 48 41 - 77 %    Lymphocytes 15 (L) 24 - 44 %    Monocytes 23 (H) 4 - 12 %    Metamyelocyte 5 %    Myelocyte 4 %    Absolute Neutrophil Count Manual 1.7 (L) 1.8 - 7.0 10*3/uL    Other Cells 5 %   LIPID PROFILE    Collection Time: 12/22/23  6:48 AM   Result Value Ref Range    Cholesterol 68 <200 mg/dL    Triglycerides 98 <147 mg/dL    HDL 32 (L) >82 mg/dL    LDL 32 <956 mg/dL    VLDL 21.3 mg/dL    Non HDL Cholesterol 36 mg/dL   POC GLUCOSE    Collection Time: 12/22/23  7:45 AM   Result Value Ref Range    Glucose, POC 112 (H) 70 - 100 mg/dL   POC GLUCOSE    Collection Time: 12/22/23 11:23 AM   Result Value Ref Range    Glucose, POC 136 (H) 70 - 100 mg/dL   POC GLUCOSE    Collection Time: 12/22/23  5:34 PM   Result Value Ref Range    Glucose, POC 194 (H) 70 - 100 mg/dL

## 2023-12-22 NOTE — Progress Notes
 PHYSICAL THERAPY  ASSESSMENT      Name: Daryl Baker   MRN: [id number]     DOB: 02/02/1956      Age: 68 y.o.  Admission Date: 12/08/2023     LOS: 14 days     Date of Service: 12/22/2023        Patient off unit when PT attempted formal re-evaluation for MRI and lumbar puncture under sedation. Discussed current level of mobility and assist needed with RN, reports he is using the urinal and overall moving okay but takes increased time. Spouse reports patient has barely gotten out of bed and will not walk unless someone asks him to. Spouse reports patient will need to be mod I prior to returning home. Updates chart to reflect assist patient has at home.    PT will follow up tomorrow for eval/intervention and assist in any discharge planning.  Mobility  Patient Turn/Position: Off Unit    Subjective  Reason for Admission and Past Medical Hx: 68 y.o. male with with a past medical history of hypertension, diabetes type 2, left-sided HPV mediated oropharyngeal cancer undergoing radiation therapy who presented to [facility] on 12/08/2023 for dysphagia, oropharyngeal pain and inability to tolerate p.o. intake.  Significant Hospital Events: increased confusion    Home Living Situation  Comments: Patient lives with spouse and special needs child. Patient lives on main level, spouse and child live upstairs. Patients spouse with recent shoulder surgery and providing cares for child, so generally unable to provide assistance for patient. Can provide intermittent supervision. Spouse concerned about increase in patients confusion and ability to take care of himself, as he has largely been in bed since admission. She reports patient has called her in the middle of the night asked her to pick him up because he feels like he is being held hostage.      Therapist  Ova Freshwater, PT, DPT  Date  12/22/2023

## 2023-12-22 NOTE — Progress Notes
 Anesthesia staff present to monitor patient airway, vital signs, and medications. See anesthesia docflow. This RN will assist as needed.

## 2023-12-22 NOTE — Consults
 IR Pre-Procedure History and Physical/Sedation Plan    Procedure Date: 12/22/2023     Planned Procedure(s):  Lumbar puncture     Indication:  CSF testing and pressure measurement   __________________________________________________________________    Chief Complaint:  AMS    History of Present Illness: Daryl Baker is a 68 y.o. male with a history as listed below who needs LP today. Team requesting anesthesia d/t current AMS.     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, associated with type 2 diabetes mellitus (HCC) 02/11/2023     Past Medical History:    DM (diabetes mellitus) (CMS-HCC)    Hyperlipidemia    Hypertension    Neurogenic amyotrophy    Neuropathy      Surgical History:   Procedure Laterality Date    ABDOMINAL HERNIA REPAIR      KNEE ARTHROSCOPY Left       Social History     Tobacco Use    Smoking status: Former     Current packs/day: 1.00     Types: Cigarettes     Passive exposure: Past    Smokeless tobacco: Never   Substance Use Topics    Alcohol use: Yes     Alcohol/week: 1.0 standard drink of alcohol     Types: 1 Shots of liquor per week      Family History   Problem Relation Name Age of Onset    Cancer Mother      Diabetes Mother      Hypertension Mother      Cancer-Lung Mother      Stroke Father      Huntington's Chorea Maternal Grandfather        Medications Prior to Admission   Medication Sig Dispense Refill Last Dose/Taking    dexAMETHasone (DECADRON) 4 mg tablet Beginning 24 hours after each Cisplatin infusion, take 2 tablets by mouth daily for 3 days. 36 tablet 0 12/01/2023    FREESTYLE LIBRE 2 READER reader Use one each as directed every 6 hours.   Taking    lidocaine hcl viscous (LIDOCAINE VISCOUS) 2 % solution Swish and Spit 5 mL by mouth as directed four times daily as needed. 100 mL 3 Past Week    losartan (COZAAR) 50 mg tablet Take two tablets by mouth daily.   12/08/2023 Morning    metFORMIN-XR (GLUCOPHAGE XR) 750 mg extended release tablet Take one tablet by mouth daily with dinner.   12/07/2023 Bedtime    metoprolol succinate XL (TOPROL XL) 100 mg extended release tablet Take one tablet by mouth daily.   12/08/2023 Morning    morphine SR (MS CONTIN) 15 mg tablet Take one tablet by mouth every 12 hours.   Taking    morphine SR (MS CONTIN) 30 mg ER tablet Take one tablet by mouth every 12 hours for 30 days. Indications: severe chronic pain requiring long-term opioid treatment  Dose adjustment requiring early fill 60 tablet 0 12/08/2023 Morning    OLANZapine (ZYPREXA) 5 mg tablet Take 1 tablet by mouth daily at bedtime for 4 days starting on Cisplatin infusion day.  Indications: prevent nausea and vomiting from cancer chemotherapy 24 tablet 0 Taking  ondansetron HCL (ZOFRAN) 8 mg tablet Take one tablet by mouth every 8 hours as needed (nausea and vomiting). Indications: prevent nausea and vomiting from cancer chemotherapy 90 tablet 0 Taking As Needed    oxyCODONE (ROXICODONE) 5 mg tablet Take one tablet to two tablets by mouth every 4-6 hours as needed for Pain. Indications: pain 120 tablet 0 12/08/2023 Morning    triamcinolone acetonide (TRIDERM) 0.1 % topical cream Apply one g topically to affected area twice daily. 80 g 3 Past Week    TRULICITY 3 mg/0.5 mL injection pen Inject 0.5 mL under the skin every 7 days. Sunday   12/07/2023 Morning     No Known Allergies    Review of Systems  Review of systems not obtained due to patient factors.    Physical Exam:  Vital Signs: Last Filed In 24 Hours Vital Signs: 24 Hour Range   BP: 129/71 (03/24 0742)  Temp: 36.7 ?C (98 ?F) (03/24 0742)  Pulse: 102 (03/24 0742)  Respirations: 20 PER MINUTE (03/24 0742)  SpO2: 92 % (03/24 0742)  O2 Device: None (Room air) (03/24 0742) BP: (106-153)/(54-86)   Temp:  [36.3 ?C (97.3 ?F)-37.3 ?C (99.2 ?F)]   Pulse:  [92-112]   Respirations:  [18 PER MINUTE-20 PER MINUTE]   SpO2:  [92 %-96 %]   O2 Device: None (Room air)   Intensity Pain Scale (Self Report): 8 (12/22/23 1038)      General appearance: Alert, NAD  Neurologic: Disoriented   Lungs: Non labored at rest  Heart: Regular rate and rhythm  Abdomen: Non-distended      Pre-procedure anxiolysis plan: Per Anesthesia  Sedation/Medication Plan: Per Anesthesia  Personal history of sedation complications: Per Anesthesia  Family history of sedation complications: Per Anesthesia  Medications for Reversal: Per Anesthesia  Discussion/Reviews:  Physician has discussed risks and alternatives of this type of sedation and above planned procedures with patient and significant other    NPO Status: Acceptable  Airway:  Per Anesthesia  Head and Neck: Per Anesthesia  Mouth: Per Anesthesia   Anesthesia Classification:  Per Anesthesia  Pregnancy Status: N/A      Lab/Radiology/Other Diagnostic Tests:  Labs:  Pertinent labs reviewed           Andraya Frigon P Divine-Thiele, APRN-NP  Pager [phone]

## 2023-12-22 NOTE — Progress Notes
 PALLIATIVE CARE INPATIENT NOTE     Name: Daryl Baker            MRN: [id number]                DOB: 12-23-1955          Age: 68 y.o.  Admission Date: 12/08/2023             LOS: 14 days    ASSESSMENT/PLAN   Daryl Baker is a 68 y.o. 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 intake. The palliative care team was consulted for assistance with symptom management for radiation induced mucositis.    # Pain, secondary to malignancy  # Oropharyngeal carcinoma, left, HPV mediated  # Dysphagia  # Poor p.o. intake    Discussion: Visited with Daryl Baker this morning. No family at bedside. Patient is very confused, unable to accurately state place, time, or situation. Discussed plan to get MRI today. Patient called his wife during my visit and tells her that he is with the administrator of the hospital and that he is in big trouble. I introduced myself to patient's wife and explained situation. She states that she is already on her way and will be at the hospital later this morning.     RECOMMENDATIONS: (No new changes)  Pain appears improved, mental status   Decreased fentanyl patch to 50 mcg/h  Continue Oxycodone 15 mg Q3H PRN - (used 5 doses in past 24 hours)  Continue Dilaudid 0.5 to 1mg  IV Q2 hours PRN - (used 2 Doses in past 24 hours)  Continue gabapentin 300 mg 3 times daily  Continue senna 2 tab BID, continue miralax 1 packet twice daily.     Rachael Fee, FNP-C  Palliative Medicine Nurse Practitioner  Available on Vo[employer]   Pager [phone]    High medical decision making due to the following:  1 or more chronic illness with side effects due to treatment  drug therapy requiring intensive monitoring for toxicity and IV controlled substances    PALLIATIVE CARE PLANNING     Advance Care Planning:   Identified Health Care Decision Maker:    DPOA - Would benefit from completion of DPOA  TPOPP - Not discussed    PC Clinic - NA    Medication safety - Advise a Narcan prescription on discharge    Disposition planning: Pending     SUBJECTIVE     CC/Reason for Visit: Symptoms (pain)    Interval HPI: Patient reports he needs changes to his pain regimen. Reporting pain as 8/10. Discussed that he has pain medication available at any time and that he is not using his pain medication as much as he could. I wrote his medication doses/frequency on the board., He does feel that he is talking better. Pain is mainly in his throat. He says that he has not pooped in several days however multiple BMs charted by nursing.     ROS: Review of Systems   HENT:          Severe throat pain     OBJECTIVE     Blood pressure 129/71, pulse 102, temperature 36.7 ?C (98 ?F), height 179.1 cm (5' 10.5), weight 101.5 kg (223 lb 12.8 oz), SpO2 92%.  Physical Exam  Constitutional:       Comments: Uncomfortable appearing male,  sitting up in in bed, looks less uncomfortable.   HENT:      Head: Normocephalic.   Eyes:      Extraocular Movements: Extraocular movements intact.   Cardiovascular:      Rate and Rhythm: Normal rate.   Pulmonary:      Effort: Pulmonary effort is normal.   Abdominal:      General: There is distension.      Palpations: Abdomen is soft.      Tenderness: There is no abdominal tenderness.   Musculoskeletal:         General: Normal range of motion.      Cervical back: Normal range of motion.   Skin:     General: Skin is warm and dry.      Coloration: Skin is pale.   Neurological:      Mental Status: He is alert. He is confused.   Psychiatric:         Attention and Perception: He is inattentive.         Behavior: Behavior is cooperative.       Lab Results:  CBC   Lab Results   Component Value Date/Time    WBC 3.60 (L) 12/22/2023 06:48 AM    HGB 7.8 (L) 12/22/2023 06:48 AM    PLTCT 367 12/22/2023 06:48 AM     Lab Results   Component Value Date/Time    NEUT 60.0 12/18/2023 05:59 AM    ANC 1.7 (L) 12/22/2023 06:48 AM      Chemistries   Lab Results   Component Value Date/Time    NA 134 (L) 12/22/2023 06:48 AM    K 4.3 12/22/2023 06:48 AM    BUN 11 12/22/2023 06:48 AM    CR 0.58 12/22/2023 06:48 AM    GLU 90 12/22/2023 06:48 AM     Lab Results   Component Value Date/Time    CA 7.4 (L) 12/22/2023 06:48 AM    PO4 3.9 12/15/2023 04:27 AM    ALBUMIN 2.7 (L) 12/22/2023 06:48 AM    TOTPROT 5.3 (L) 12/22/2023 06:48 AM    ALKPHOS 40 12/22/2023 06:48 AM    AST 14 12/22/2023 06:48 AM    ALT 9 12/22/2023 06:48 AM    TOTBILI 0.3 12/22/2023 06:48 AM    GFR >60 12/22/2023 06:48 AM        Other Pertinent Diagnostic Results:     MRI 12/22/23  1.  Scattered small acute to subacute bilateral cerebral white matter   infarcts. No significant intracranial mass effect.   2.  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     Rachael Fee, [facility]  Palliative Medicine Nurse Practitioner  Available on [facility]   Pager [phone]

## 2023-12-22 NOTE — Progress Notes
 General Progress Note    Name:  Daryl Baker     Today's Date:  12/22/2023  Admission Date: 12/08/2023  LOS: 14 days                     Assessment/Plan:    Principal Problem:    Oropharyngeal mass  Active Problems:    Oropharyngeal cancer (CMS-HCC)    Severe malnutrition    Mouth pain    Drug-induced constipation    Encephalopathy acute      68 y.o. 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 He has had increasing pain over the last few days prior to admission near his oropharyngeal mass on the left along with the left aspect of his tongue preventing any PO intake and has a scheduled G-tube placement.    Interval Events 12/22/2023:   -Scheduled trazodone 50mg  & melatonin 10mg  qhs for sleep  -MRI brain & LP under anesthesia. Neurology consulted.      Acute metabolic encephalopathy  -Blood sugar  = normal/ elevated  -Na = wnl, Ca = 8.3  -3/17 venous pH = 7.42  -TSH = 0.39  -Hx of cisplatin 5 cycles, which can cause encephalopathy/PRES  -currently denying headache, has been stooling regularly  -Had a course of thiamine during admission  -EEG: mild encephalopathy  -MRI attempted x2 with ativan but patient was unable to tolerate without anesthesia  -MRI brain: Scattered small acute to subacute bilateral cerebral white matter Infarcts concerning for emboli.  -Lumbar Puncture: elevated protein, cultures pending, cytology pending  Plan:  > TTE with bubble study  > high dose thiamine   > Scheduled melatonin & trazodone increased to 50mg , continue delirium precautions  > Neurology consulted- follow-up their recs    Neutropenic fever  Presumed Aspiration pneumonia  Sepsis- resolved  -tachycardia, fever, increase in WBC (leukopenic at baseline)  -source likely pulmonary given imaging findings  -UA unremarkable  -CTA Chest: Development of a few clustered nodular and patchy opacities within the   right greater than left lower lobes, probably infectious or aspiration-related.   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  Plan:  > Trulicity nonformulary, will hold inpatient  > glyburide 2.5 mg daily & metformin 500 BID for hyperglycemia via G-tube (held 3/23 at midnight for MRI & LP under anesthesia)    Dysphagia   Oropharyngeal pain  -Due to left oropharyngeal cancer  -G-tube placed 3/12 AM  -He will need the G-tube feeds for >90 days.  Plan:  >Magic mouthwash TID PRN for oropharyngeal pain  > Consulted Dietician  >Transitioned to tube feeds 3/15, formal recs in dietician note  > Palliative Care consult for pain management as above.   -Fentanyl patch 48mcg/hr  -Oxycodone 15mg  q4h PRN  -Dilaudid 0.5-1mg  IV q2h PRN  -Gabapentin 300mg  TID  -Bowel regimen with senokot-s 2ts BID, miralax BID  >Fluconazole 400mg  qday for oral candidiasis     Left HPV-Mediated Oropharyngeal SCC  -Currently on chemo and radiation  -Follow in Oncology clinic with dr Neita Carp and radiation Oncology Dr  Lominska  -CT Neck revealed:  1.  Marked decrease size of the prior large left oropharyngeal mass. There   is a residual 1.7 x 1.5 cm lesion involving the left tonsil with central   fluid attenuation. Treated necrotic tumor is the leading consideration. A   post therapeutic retention cyst could appear similar. An abscess is   thought less likely though direct visualization and clinical correlation   is recommended regarding symptoms of infection.   2.  Improvement of bilateral cervical lymphadenopathy.   Plan:  > Radiation Oncology with continue to perform radiation while inpatient.(3/17-3/19)  > Triamcinolone cream for radiation induced dermatitis    Pancytopenia   Neutropenia  -ANC <1  -likely due to Chemo & radiation  -follow with daily CBC w/ Diff     Hypertension  >Continue to hold metoprolol & losartan    FEN:  Bolus tube feeds with free water flushes/Replace electrolytes as needed/Tube feeds  DIET CLEAR LIQUID w/ Body mass index is 31.66 kg/m?Marland Kitchen   DVT PPX:  40mg  qday enoxaparin, SCDs bilat  Code status: Full Code     Patient seen and case discussed with Dr. Franco Nones, MD   Internal Medicine-Psychiatry Resident, PGY1  Available on Voalte   ________________________________________________________________________    Subjective  Patient seen and examined this morning at bedside. Remains encephalopathic. He is unable to find his phone on the table in front of him, he reports that he is in a truck on his driveway, and he is unable to use his phone to call his wife. He does answer all orientation questions correctly.    Medications  Scheduled Meds:[Held by Provider] Diet Enteral Feeding Bolus, , SEE ADMIN INSTRUCTIONS, 5XDAY (6,10,14,18,22   And  [Held by Provider] Water Bolus, 100 mL, PEG Tube, 5XDAY (6,10,14,18,22  enoxaparin (LOVENOX) syringe 40 mg, 40 mg, Subcutaneous, QDAY(21)  fentaNYL (DURAGESIC) 50 mcg/hr patch 1 patch, 1 patch, Transdermal, Q72H*   And  Verification of Patch Placement and Integrity - fentaNYL 50 mcg/hr, , Transdermal, BID  fluconazole (DIFLUCAN) oral suspension 200 mg, 200 mg, Oral, QDAY  gabapentin (NEURONTIN) capsule 300 mg, 300 mg, Per G Tube, TID  [Held by Provider] glyBURIDE (DIABETA) tablet 2.5 mg, 2.5 mg, Oral, QDAY  melatonin tablet 10 mg, 10 mg, Oral, QHS  [Held by Provider] metFORMIN (GLUCOPHAGE) tablet 500 mg, 500 mg, Oral, BID w/meals  polyethylene glycol 3350 (MIRALAX) packet 17 g, 1 packet, Per G Tube, QDAY  sennosides-docusate sodium (SENOKOT-S) tablet 2 tablet, 2 tablet, Per G Tube, QDAY  thiamine hcl (vitamin B1) injection 500 mg, 500 mg, Intravenous, Q8H  traZODone (DESYREL) tablet 75 mg, 75 mg, Oral, QHS  triamcinolone acetonide (KENALOG) 0.1 % topical cream 1 g, 1 g, Topical, BID    Continuous Infusions:      PRN and Respiratory Meds:acetaminophen Q6H PRN, bisacodyL QDAY PRN, dextrose 50% (D50) IV PRN, diphenhydrAMINE/lidocaine/mag,al-simeth(#) TID PRN, HYDROmorphone (DILAUDID) injection Q2H PRN, ondansetron Q6H PRN **OR** ondansetron (ZOFRAN) IV Q6H PRN, oxyCODONE Q3H PRN, pancrelipase 20,880 Units/sodium bicarbonate 650 mg ([place] CLOG DESTROYER) PRN (On Call from Rx), sodium chloride PRN        Objective:                          Vital Signs: Last Filed                 Vital Signs: 24 Hour Range   BP: 131/58 (03/24 0312)  Temp: 36.8 ?  C (98.3 ?F) (03/24 5284)  Pulse: 106 (03/24 0312)  Respirations: 20 PER MINUTE (03/24 0312)  SpO2: 92 % (03/24 0312)  O2 Device: None (Room air) (03/24 0312) BP: (106-153)/(54-86)   Temp:  [36.3 ?C (97.3 ?F)-37.3 ?C (99.2 ?F)]   Pulse:  [92-112]   Respirations:  [18 PER MINUTE-20 PER MINUTE]   SpO2:  [92 %-98 %]   O2 Device: None (Room air)   Intensity Pain Scale (Self Report): 10 (12/21/23 1654) Vitals:    12/08/23 0907 12/08/23 1445 12/09/23 1715   Weight: 102 kg (224 lb 13.9 oz) 101.5 kg (223 lb 12.8 oz) 101.5 kg (223 lb 12.8 oz)       Intake/Output Summary:  (Last 24 hours)    Intake/Output Summary (Last 24 hours) at 12/22/2023 0715  Last data filed at 12/22/2023 0654  Gross per 24 hour   Intake 854 ml   Output 1150 ml   Net -296 ml           Physical Exam  Constitutional:       General: He is not in acute distress.     Appearance: Normal appearance.   HENT:      Head: Normocephalic and atraumatic.      Nose: Nose normal.   Eyes:      Conjunctiva/sclera: Conjunctivae normal.   Cardiovascular:      Rate and Rhythm: Normal rate and regular rhythm.      Heart sounds: Normal heart sounds. No murmur heard.  Pulmonary:      Effort: Pulmonary effort is normal. No respiratory distress.      Breath sounds: Normal breath sounds.   Skin:     General: Skin is warm and dry. Neurological:      Mental Status: He is alert. He is disoriented.      Comments: Remains encephalopathic              Point of Care Testing  (Last 24 hours)  POC Glucose (Download): (!) 251 (12/21/23 1114)    Radiology and other Diagnostics Review:    CT NECK W/CONTRAST  Result Date: 12/08/2023  1.  Marked decrease size of the prior large left oropharyngeal mass. There is a residual 1.7 x 1.5 cm lesion involving the left tonsil with central fluid attenuation. Treated necrotic tumor is the leading consideration. A post therapeutic retention cyst could appear similar. An abscess is thought less likely though direct visualization and clinical correlation is recommended regarding symptoms of infection. 2.  Improvement of bilateral cervical lymphadenopathy. By my electronic signature, I attest that I have personally reviewed the images for this examination and formulated the interpretations and opinions expressed in this report  Finalized by Marily Memos, M.D. on 12/08/2023 12:00 PM. Dictated by Jerene Pitch, MD on 12/08/2023 11:07 AM.    CT HEAD WO CONTRAST  Result Date: 12/08/2023  1.  No acute intracranial hemorrhage or mass effect. 2.  Mild patchy cerebral white matter hypodensities, likely sequelae of chronic microvascular ischemia. 3.  The CT neck is dictated separately. Please see that report. By my electronic signature, I attest that I have personally reviewed the images for this examination and formulated the interpretations and opinions expressed in this report  Finalized by Marily Memos, M.D. on 12/08/2023 12:00 PM. Dictated by Jerene Pitch, MD on 12/08/2023 10:51 AM.

## 2023-12-22 NOTE — Anesthesia Post-Procedure Evaluation
 Post-Anesthesia Evaluation    Name: Daryl Baker      MRN: [id number]     DOB: 1956-07-24     Age: 68 y.o.     Sex: male   __________________________________________________________________________     Procedure Information       Anesthesia Start Date/Time: 12/22/23 1444    Scheduled providers: Eliezer Lofts, RN; Antonieta Iba, RT(R)(VI),LRT; Carrington Clamp, MD    Procedure: IR LUMBAR PUNCTURE    Location: Interventional Radiology: [name]            Post-Anesthesia Vitals  BP: 123/75 (03/24 1630)  Pulse: 97 (03/24 1630)  SpO2: 92 % (03/24 1630)  O2 Device: Nasal cannula (03/24 1630)   Vitals Value Taken Time   BP 123/75 12/22/23 1630   Temp 36.9 ?C (98.4 ?F) 12/22/23 1542   Pulse 97 12/22/23 1630   Respirations     SpO2 92 % 12/22/23 1630   O2 Device Nasal cannula 12/22/23 1630   ABP     ART BP           Post Anesthesia Evaluation Note    Evaluation location: Pre/Post  Patient participation: recovered; patient participated in evaluation  Level of consciousness: alert    Pain score: Pain scale: adequate.  Pain management: adequate    Hydration: normovolemia  Temperature: 36.0?C - 38.4?C  Airway patency: adequate    Perioperative Events       Post-op nausea and vomiting: no PONV    Postoperative Status  Cardiovascular status: hemodynamically stable  Respiratory status: spontaneous ventilation  Follow-up needed: none        Perioperative Events  There were no known complications for this encounter.

## 2023-12-22 NOTE — Consults
 Wound Ostomy Note    NAME:Daryl Baker                                                                   MRN: [id number]                 DOB:02/21/1956          AGE: 68 y.o.  ADMISSION DATE: 12/08/2023             DAYS ADMITTED: LOS: 14 days      Reason for Consult/Visit:   pressure injury Stage II or greater    Re-consulted for a R buttocks wound. Pt seen by our service on 3/19 and photo from that visit compared to most recent photo reveals proper healing.    Continue current wound care recommendations.     Wound team will sign off.       3/19    3/23    Guido Sander, RN, BSN, [facility]  Wound/Ostomy Nursing Consult Service  Available via [place] text when in house.   For questions after 3:30pm M-F/weekends/holidays, voalte First Contact

## 2023-12-23 ENCOUNTER — Encounter: Admit: 2023-12-23 | Discharge: 2023-12-23 | Payer: MEDICARE

## 2023-12-23 ENCOUNTER — Inpatient Hospital Stay: Admit: 2023-12-23 | Discharge: 2023-12-23 | Payer: MEDICARE

## 2023-12-23 DIAGNOSIS — C109 Malignant neoplasm of oropharynx, unspecified: Secondary | ICD-10-CM

## 2023-12-23 LAB — POC GLUCOSE
~~LOC~~ BKR POC GLUCOSE: 75 mg/dL (ref 70–100)
~~LOC~~ BKR POC GLUCOSE: 55 mg/dL — ABNORMAL LOW (ref 70–100)
~~LOC~~ BKR POC GLUCOSE: 65 mg/dL — ABNORMAL LOW (ref 70–100)
~~LOC~~ BKR POC GLUCOSE: 396 mg/dL — ABNORMAL HIGH (ref 70–100)
~~LOC~~ BKR POC GLUCOSE: 236 mg/dL — ABNORMAL HIGH (ref 70–100)
~~LOC~~ BKR POC GLUCOSE: 273 mg/dL — ABNORMAL HIGH (ref 70–100)
~~LOC~~ BKR POC GLUCOSE: 50 mg/dL — ABNORMAL LOW (ref 70–100)
~~LOC~~ BKR POC GLUCOSE: 52 mg/dL — ABNORMAL LOW (ref 70–100)

## 2023-12-23 LAB — MANUAL DIFF
~~LOC~~ BKR LYMPHOCYTES - RELATIVE: 4 % — ABNORMAL LOW (ref 24–44)
~~LOC~~ BKR METAMYELOCYTES - RELATIVE: 4 %
~~LOC~~ BKR MONOCYTES - RELATIVE: 3 % — ABNORMAL LOW (ref 4–12)
~~LOC~~ BKR MYELOCYTES - RELATIVE: 6 %
~~LOC~~ BKR NEUT+BANDS - ABSOLUTE: 2.9 10*3/uL (ref 1.8–7.0)
~~LOC~~ BKR NEUTROPHILS - RELATIVE: 82 % — ABNORMAL HIGH (ref 41–77)
~~LOC~~ BKR PROMYELOCYTE - RELATIVE: 1 %

## 2023-12-23 LAB — VITAMIN B1 (THIAMINE) WHOLE BLD: ~~LOC~~ BKR VITAMIN B1,THIAMINE: 120 nmol/L — ABNORMAL HIGH (ref 70–180)

## 2023-12-23 LAB — 2D + DOPPLER ECHO
AV INDEX (NATIVE): 0.9
AV PEAK VELOCITY: 1 m/s
BSA: 2.2 m2
DOP CALC LVOT AREA: 4.1 cm2
DOP CALC LVOT DIAMETER: 2.3 cm
DOP CALC LVOT PEAK VEL VTI: 21 cm
DOP CALC LVOT PEAK VEL: 0.9 m/s
DOP CALC LVOT STROKE VOLUME: 89 cm3
ECHO EF: 60 %
EJECTION FRACTION: 66 %
FRACTIONAL SHORTENING: 38 % (ref 28–44)
INTERVENTRICULAR SEPTUM: 1.1 cm (ref 0.6–1)
LEFT INTERNAL DIMENSION IN SYSTOLE: 3.3 cm (ref 2.5–4)
LEFT VENTRICLE DIASTOLIC VOLUME INDEX: 50 mL/m2 (ref 34–74)
LEFT VENTRICLE DIASTOLIC VOLUME: 113 mL (ref 62–150)
LEFT VENTRICLE MASS INDEX: 104 g/m2 (ref 49–115)
LEFT VENTRICLE SYSTOLIC VOLUME INDEX: 17 mL/m2 (ref 11–31)
LEFT VENTRICLE SYSTOLIC VOLUME: 39 mL (ref 21–61)
LEFT VENTRICULAR INTERNAL DIMENSION IN DIASTOLE: 5.4 cm (ref 4.2–5.8)
LEFT VENTRICULAR MASS: 235 g (ref 88–224)
POSTERIOR WALL: 1.1 cm (ref 0.6–1)
RA PRESSURE: 3
RELATIVE WALL THICKNESS: 0.4 (ref <=0.42)
SIMPSONS BIPLANE EF: 65 %
SINUS: 3.6 cm (ref 2.8–4)

## 2023-12-23 LAB — CSF CELL COUNT
~~LOC~~ BKR CELLS COUNTED: 1
~~LOC~~ BKR CSF MONO/HISTOCYTE: 100 %
~~LOC~~ BKR CSF RBC: 1 /uL — ABNORMAL HIGH (ref <=0)
~~LOC~~ BKR CSF TNC: 0 /uL (ref <=5)

## 2023-12-23 LAB — CBC AND DIFF
~~LOC~~ BKR RBC COUNT: 2.4 10*6/uL — ABNORMAL LOW (ref 70–180)
~~LOC~~ BKR WBC COUNT: 3.5 10*3/uL — ABNORMAL LOW (ref 4.50–11.00)

## 2023-12-23 LAB — COMPREHENSIVE METABOLIC PANEL: ~~LOC~~ BKR SODIUM, SERUM: 134 mmol/L — ABNORMAL LOW (ref 137–147)

## 2023-12-23 LAB — CSF CELL PATH REVIEW

## 2023-12-23 MED ORDER — ASPIRIN 81 MG PO CHEW
81 mg | Freq: Every day | ORAL | 0 refills | Status: DC
Start: 2023-12-23 — End: 2023-12-23

## 2023-12-23 MED ORDER — ASPIRIN 81 MG PO CHEW
81 mg | Freq: Every day | GASTROSTOMY | 0 refills | Status: DC
Start: 2023-12-23 — End: 2024-01-02
  Administered 2023-12-23 – 2024-01-02 (×11): 81 mg via GASTROSTOMY

## 2023-12-23 MED ORDER — SODIUM CHLOR 0.9% BACTERIOSTAT 0.9 % IJ SOLN (NO$)
20 mL | Freq: Once | INTRAMUSCULAR | 0 refills | Status: AC
Start: 2023-12-23 — End: ?

## 2023-12-23 MED ORDER — PERFLUTREN LIPID MICROSPHERES 1.1 MG/ML IV SUSP
1-10 mL | Freq: Once | INTRAVENOUS | 0 refills | Status: CP | PRN
Start: 2023-12-23 — End: ?
  Administered 2023-12-23: 15:00:00 2 mL via INTRAVENOUS

## 2023-12-23 MED ORDER — SODIUM CHLORIDE 0.9 % IJ SOLN
10 mL | Freq: Once | INTRAVENOUS | 0 refills | Status: CP
Start: 2023-12-23 — End: ?
  Administered 2023-12-23: 15:00:00 10 mL via INTRAVENOUS

## 2023-12-23 NOTE — Progress Notes
 OCCUPATIONAL THERAPY  ASSESSMENT NOTE      Name: Daryl Baker   MRN: [id number]     DOB: 1956-04-10      Age: 68 y.o.  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 intake.  Mental / Cognitive: Alert;Oriented;Cooperative;Follows commands  Pain: Complains of pain  Pain Location: Throat (when speaking)  Pain Interventions: Treatment altered to patient's pain tolerance  Persons Present: Occupational Therapist    Home Living Situation  Lives With: Spouse/significant other  Type of Home: House  Entry Stairs: No stairs;Ramp  In-Home Stairs: Able to live on main level  Patient Owned Equipment: Walker: Rollator  Comments: Patient lives with spouse and special needs child. Patient lives on main level, spouse and child live upstairs. Patients spouse with recent shoulder surgery and providing cares for child, so generally unable to provide assistance for patient. Can provide intermittent supervision. Spouse concerned about increase in patients confusion and ability to take care of himself, as he has largely been in bed since admission. She reports patient has called her in the middle of the night asked her to pick him up because he feels like he is being held hostage.    Prior Level of Function  Level Of Independence: Independent with ADL and community mobility with device  History of Falls in Past 3 Months: No    ADL's  LE Dressing Assist: Minimal Assist  LE Dressing Deficits: Thread RLE Into Underwear;Thread LLE Into Underwear;Pull Up Over Hips  Comment: steadying assist in standing to pull brief over hips    ADL Mobility  Bed Mobility: Supine to Sit: Standby assist  Bed Mobility Comments: HOB flat, use of rail  Transfer Type: Sit to/from stand  Transfer: Assistance Level: From;Bed;Minimal assist  Transfer: Assistive Device: Roller walker  End of Activity Status: Up in chair;Instructed patient to request assist with mobility;Instructed patient to use call light  Transfer Comments: steadying assist  Gait: Assistance Level: Minimal assist  Gait: Assistive Device: Roller walker  Gait Comments: ambulates in hallway, overall tolerates well with steadying assist for safety, no overt loss of balance    Cognition  Cognition Comment: oriented to person, place, year and month. somewhat tangential in conversation    ROM  R LE ROM: WFL  R LE ROM Method: Active  L LE ROM: WFL  L LE ROM Method: Active  Grasp: Bilateral Grasp Functional for Activity    Sensory  Comment: pt reports some baseline neuropathy    Education  Persons Educated: Patient  Teaching Methods: Verbal Instruction  Patient Response: Verbalized Understanding  Topics: Role of OT, Goals for Therapy  Goal Formulation: With Patient    Assessment  Assessment: Decreased ADL Status;Decreased Safe/Judg during ADL;Decreased Cognition;Decreased Endurance;Decreased Self-Care Trans;Decreased High-Level ADLs  Goal Formulation: Patient  Comments: pt is limited by continued confusion as well as generalized weakness. would benefit from continued therapy in an inpatient setting as pt must be independent to  return home. pending acute length of stay, may progress for home discharge. could consider OT KELS evaluation closer to expected dc to evaluate cognitive needs    AM-PAC 6 Clicks Daily Activity Inpatient  Putting on and taking off regular lower body clothes: A Little  Bathing (Including washing, rinsing, drying): A Little  Toileting, which includes using toilet, bedpan, or urinal: A Little  Putting on and taking off regular upper body clothing: None  Taking care of personal grooming such as brushing teeth: None  Eating meals: None  Daily Activity Raw Score: 21  Standardized (T-scale) Score: 44.27    Plan  OT Frequency: 5x/week  OT Plan for Next Visit: toileting in bathroom, grooming at sink, progress endurance    ADL Goals  Patient Will Perform All ADL's: w/ Modified Independence    Functional Transfer Goals  Pt Will Perform All Functional Transfers: Modified Independent    OT Discharge Recommendations  Recommendation: Inpatient setting;Recommend rehab medicine consult    Therapist: Lorella Nimrod, OTR/L [id number]  Date: 12/23/2023

## 2023-12-23 NOTE — Case Management (ED)
 Case Management Progress Note    NAME:Kyre Emoni Yang                          MRN: [id number]              DOB:Oct 25, 1955          AGE: 68 y.o.  ADMISSION DATE: 12/08/2023             DAYS ADMITTED: LOS: 15 days      Today's Date: 12/23/2023    PLAN: Palliative care continues to follow for support and symptom management.     Expected Discharge Date: 01/02/2024   Is Patient Medically Stable: No, Please explain: ongoing symptoms   Are there Barriers to Discharge? no    INTERVENTION/DISPOSITION:  Discharge Planning              Discharge Planning: Inpatient Rehabilitation   Pt hopeful to dc to rehab/SNF prior to dc home.     Transportation              Does the Patient Need Case Management to Arrange Discharge Transport? (ex: facility, ambulance, wheelchair/stretcher, Medicaid, cab, other): No  Transportation Name, Phone and Availability #1: vince, ainsley (Spouse)  [phone] (Mobile)  Support              Support: Pt/Family Updates re:POC or DC Plan, Huddle/team update   SW rounded with palliative care team   Pt was sitting up in the recliner upon our arrival   He presented with a pleasant affect    He shared that he had a stroke work up this morning.    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      Pt shares that his pain is 9/10   Previously, he has rated it as 27/10   He does share that pain is better overall since the beginning of this hospitalization.      Team will continue to follow and support.       Info or Referral              Information or Referral to Community Resources: No Needs Identified  Positive SDOH Domains and Potential Barriers   Positive for SDOH Domain:  (No Needs Identified)               Medication Needs              Medication Needs: No Needs Identified Financial              Financial: No Needs Identified  Legal              Legal: No Needs Identified  Other              Other/None: No needs identified  Discharge Disposition  Selected Continued Care - Admitted Since 12/08/2023       [facility] Coordination complete.      Service Provider Services Address Phone Fax Patient Preferred    [facility] [address], [place] [address] [phone] --              [facility] Coordination complete.      Service Provider Services Address Phone Fax Patient Preferred    [facility] [address], [place] [address] [phone] --                  Briscoe Burns, LSCSW  [employer]

## 2023-12-23 NOTE — Consults
 Physical Medicine & Rehabilitation Consult Service    Name: Daryl Baker    MRN: [id number]     DOB: 01/16/1956      Age: 68 y.o.   Admission Date: 12/08/2023     LOS: 15          Date of Service: 12/23/2023  Financial Class: Payor: HU[employer] MEDICARE / Plan: HU[employer] CHOICE PPO MEDICARE / Product Type: Medicare /   Referring Physician: Sharen Hones, MD  Reason for Consult: evaluate for Post-Acute Rehab/Placement  Precautions: Fall   Weight Bearing Precautions: WBAT    ASSESSMENT:  Principal Problem:    Oropharyngeal mass  Active Problems:    Oropharyngeal cancer (CMS-HCC)    Severe malnutrition    Mouth pain    Drug-induced constipation    Encephalopathy acute  Type 2 DM  Neutropenic fever  Aspiration pneumonia  Gait abnormality  Impaired mobility/ADLs  Impaired transfers  Cognitive Deficits       Daryl Baker is a 68 y.o. 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 Rehabilitation medicine consultation was requested to assist with discharge planning and rehabilitation needs.     Rehab diagnosis: stroke  Impairments: cognitive impairments, loss of coordination, and weakness     Activity Limitations:  grooming, dressing - upper, dressing - lower, toileting, transfers, ambulation, stairs, and memory  Participation Restrictions: unable to return home safely  Family / Patient Dispositional Goals: return home with family assistance    DISCHARGE RECOMMENDATIONS:  Post-acute care rehabilitation needs: acute inpatient rehabilitation  Patient?s medical complexity warrants daily physician oversight and functional goals consistent with intensive rehabilitation in acute inpatient rehabilitation.     He has therapy goals in all three disciplines (PT/OT/SLP).  Medical complexity includes oropharyngeal SCC on active treatment, pancytopenia, hypercoagulable state, new stroke, neutropenic fever with sepsis, T2DM with uncontrolled hyperglycemia, tube feeds.     Barriers/Facilitators:  Barriers: Medical complexity/cancer treatment plan  Facilitators: controlled pain, patient motivation, and improving strength / endurance  Rehabilitation Prognosis: Fair to good  Tolerance for three hours of therapy a day: Good    Prior to the inpatient rehabilitation admission complete the following:   Chemo Please note that [facility] acute inpatient rehabilitation does not typically administer chemotherapy during admission to our facility.  Please clarify plans for treatment and clear follow-up with [facility] for future treatments.  IV pain medications: still intermittently requiring IV dilaudid. Should be off all IV pain medications prior to discharge to rehab.     Overall Functional Goals  Gait and mobility ModI with walker   Transfers ModI   Upper body dressing ModI   Lower body dressing SBA   Toileting SBA   Bathing SBA   Cognition / Communication Speech therapy will evaluate and treat cognition and communication deficits and assess for safe swallow      ____________________________________________________________________________    REHABILITATION RECOMMENDATIONS:    Impaired mobility and self cares  Musculoskeletal considerations  Mobilize to chair or EOB for meals TID to increase endurance as able to promote upright tolerance, truncal stability, and prevent orthostasis. Goal 4-6 hours a day in total.   Agree with continuing PT/OT to address impaired mobility/functional deficits to improve independence with transfers, gait training, self cares    Impaired cognition  Would benefit from SLP cognitive evaluation but this can be done  in the rehab setting.     Post-discharge follow-up: Oncology Rehabilitation clinic with Dr. Lillia Pauls 4-6 weeks after rehab discharge.    Thank you for the opportunity to be involved in the care of this patient. We will continue to follow regarding the patient's rehabilitation needs. Please call our consult pager with questions or concerns.    Zachery Dakins, MD MPH  Attending Physician  Oncology Rehabilitation  Available via Voalte        HISTORY OF PRESENT ILLNESS:    CC: Weakness    Hospital Course: Daryl Baker is a pleasant 68 y.o. 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        During this admission, pain managed with support from palliative care. He is receiving IV dilaudid 0.5-1mg  a couple of times a day currently for primarily pain of his throat.  AMS has been secondary to metabolic encephalopathy and stroke. MRI brain 12/22/23 demonstrating scattered small acute to early subacute bilateral cerebral white matter infarcts, LP with elevated protein. On high dose thiamine. Neurology following for bilateral scattered infarcts and AMS. Suspected to be from hypoperfusion or embolic. Started on ASA 81.  He has been treated with antibiotics for presumed aspiration pneumonia. Gtube placed 12/10/23, on tube feeds. Has elevated blood sugars. He completed radiation 12/18/23. Remains afebrile, blood pressures controlled.     The primary team has consulted PT and OT to address functional and mobility deficits.  SLP has been consulted as well for swallow. Functionally, the patient is ambulating 111ft with a walker. He reports LLE weakness and numbness prior to admit. He has been doing aquatic therapy which he enjoyed.     Last Bowel Movement Date: 12/22/23 (12/22/2023  9:01 PM)  Large loose bowels.      The patient's family/social support consists of: wife.    ONCOLOGIC HISTORY:  Medical Oncologist:Dr. Neita Carp  Surgeon: Alpha Gula, MD  Radiation Oncologist: Diamantina Providence, MD    Onc Timeline   Oropharyngeal cancer (CMS-HCC)   10/14/2023 Initial Diagnosis    Primary cancer of oropharynx (HCC)     10/14/2023 Cancer Staged Staging form: Pharynx - HPV-Mediated Oropharynx, AJCC 8th Edition  - Clinical stage from 10/14/2023: Stage II (cT3, cN2, cM0, p16+)     11/03/2023 -  Chemotherapy    OP HEAD/NECK CISPLATIN (WEEKLY - 40MG /M2) + XRT  Plan Provider: Jolyne Loa, MD  Treatment goal: Curative  Line of treatment: First Line         Past Medical History:    DM (diabetes mellitus) (CMS-HCC)    Hyperlipidemia    Hypertension    Neurogenic amyotrophy    Neuropathy        Surgical History:   Procedure Laterality Date    ABDOMINAL HERNIA REPAIR      KNEE ARTHROSCOPY Left         Social History     Socioeconomic History    Marital status: Married   Tobacco Use    Smoking status: Former     Current packs/day: 1.00     Types: Cigarettes     Passive exposure: Past    Smokeless tobacco: Never   Vaping Use    Vaping status: Never Used   Substance and Sexual Activity    Alcohol use: Yes     Alcohol/week: 1.0 standard drink of alcohol     Types: 1 Shots of liquor  per week    Drug use: Never    Sexual activity: Not Currently     Partners: Female       Family History   Problem Relation Name Age of Onset    Cancer Mother      Diabetes Mother      Hypertension Mother      Cancer-Lung Mother      Stroke Father      Huntington's Chorea Maternal Grandfather          Scheduled Meds:aspirin chewable tablet 81 mg, 81 mg, PEG Tube, QDAY  Diet Enteral Feeding Bolus, , SEE ADMIN INSTRUCTIONS, 5XDAY (6,10,14,18,22   And  Water Bolus, 100 mL, PEG Tube, 5XDAY (6,10,14,18,22  enoxaparin (LOVENOX) syringe 40 mg, 40 mg, Subcutaneous, QDAY(21)  fentaNYL (DURAGESIC) 50 mcg/hr patch 1 patch, 1 patch, Transdermal, Q72H*   And  Verification of Patch Placement and Integrity - fentaNYL 50 mcg/hr, , Transdermal, BID  fluconazole (DIFLUCAN) oral suspension 200 mg, 200 mg, Oral, QDAY  gabapentin (NEURONTIN) capsule 300 mg, 300 mg, Per G Tube, TID  glyBURIDE (DIABETA) tablet 2.5 mg, 2.5 mg, Oral, QDAY  melatonin tablet 10 mg, 10 mg, Oral, QHS  metFORMIN (GLUCOPHAGE) tablet 500 mg, 500 mg, Oral, BID w/meals  polyethylene glycol 3350 (MIRALAX) packet 17 g, 1 packet, Per G Tube, QDAY  sennosides-docusate sodium (SENOKOT-S) tablet 2 tablet, 2 tablet, Per G Tube, QDAY  sodium chloride 0.9% bacteriostatic injection 20 mL, 20 mL, Injection, ONCE  thiamine hcl (vitamin B1) injection 500 mg, 500 mg, Intravenous, Q8H  traZODone (DESYREL) tablet 75 mg, 75 mg, Oral, QHS  triamcinolone acetonide (KENALOG) 0.1 % topical cream 1 g, 1 g, Topical, BID      Continuous Infusions:  PRN and Respiratory Meds:acetaminophen Q6H PRN, bisacodyL QDAY PRN, dextrose 50% (D50) IV PRN, diphenhydrAMINE/lidocaine/mag,al-simeth(#) TID PRN, HYDROmorphone (DILAUDID) injection Q2H PRN, ondansetron Q6H PRN **OR** ondansetron (ZOFRAN) IV Q6H PRN, oxyCODONE Q3H PRN, pancrelipase 20,880 Units/sodium bicarbonate 650 mg ([place] CLOG DESTROYER) PRN (On Call from Rx), sodium chloride PRN       No Known Allergies          Prior Level of Function:  Independent with ADL and community mobility with device (rollator)    Patient lives with spouse and special needs child. Patient lives on main level, spouse and child live upstairs. Patients spouse with recent shoulder surgery and providing cares for child who has muscular dystrophy, so generally unable to provide assistance for patient. Can provide intermittent supervision.       Home Environment:  Home set up and support  Type of home: house  Location: [place], [place]  Steps to enter: 0. Ramp  Stairs inside: Able to live on the main level  Equipment: rollator  Caregiver support: wife Bonita Quin    Current Level Of Function:  PT Gait:Gait Distance: 150 feet Gait: Assistance Level: Minimal Assist Gait: Assistive Device: Roller Walker  Bed Mobility/Transfers  Comments: Pt in chair at beginning and end of session.  Transfer Type: Sit to Stand  Transfer: Assistance Level: From, Bed, Minimal Assist  Transfer: Assistive Device: Nurse, adult  Transfers: Type Of Assistance: For Balance, For Safety Considerations  Other Transfer Type: Stand to Sit  Other Transfer: Assistance Level: To, Bed Side Chair, Minimal Assist  Other Transfer: Assistive Device: Roller Walker  Other Transfer: Type Of Assistance: For Balance, For Safety Considerations  End Of Activity Status: Up in Chair, Nursing Notified, Instructed Patient to Request Assist with Mobility, Instructed Patient to Use  Call Light (chair alarm activate)  Comments: Pt has been up ad lib, or ambulating with family   OT ADL's  LE Dressing Assist: Minimal Assist  LE Dressing Deficits: Thread RLE Into Underwear, Thread LLE Into Underwear, Pull Up Over Hips  Comment: steadying assist in standing to pull brief over hips   SLP COGNITIVE EVALUATION SUMMARY     ORIENTATION:      SWALLOW EVALUATION SUMMARY                       Review of Systems:  A 14 point review of systems was negative except for: that noted in the HPI    Physical Exam:    BP: 118/60 (03/25 0752)  Temp: 36.9 ?C (98.4 ?F) (03/25 1610)  Pulse: 78 (03/25 0752)  Respirations: 16 PER MINUTE (03/25 0752)  SpO2: 95 % (03/25 0752)  O2 Device: None (Room air) (03/25 0752)  O2 Liter Flow: 2 Lpm (03/24 1630)  Height: 179.1 cm (5' 10.51) (03/25 9604)  Body mass index is 31.64 kg/m?.     Gen: no acute distress, appears staged age, sitting up in chair.   HEENT: Sclerae anicteric, mucous membranes dry, radiation dermatitis along the neck.   CV:  Extremities well perfused  Respiratory: non labored breathing on room air    GU:  No foley catheter in place   Extremities: No edema noted.   Neuro:    Cranial nerves: CN 3-12 intact   Motor:   Root Right Left   Shoulder Abduction C5 5 5   Elbow Flexion C5 5 5   Elbow Extension C7 5 5   Wrist Extension C6 5 5   Finger Flexion C8 5 5   Finger Abduction T1 5 5   Hip Flexion L2 5 4-   Knee Extension L3 5 4   Dorsiflexion L4 5 4   Plantarflexion S1 5 5   EHL Extension L5 5 4     Sensory:  Light touch: Decreased in the LLE.    Coordination: No dysmetria on finger-to-nose or heel-to shin testing.  Tremor: none  Mental status: alert and oriented to self, location, month, day of the week, year. Answers questions appropriately. Some memory difficulties.   Cognition:  Speech: fluent  Writing: Able to write a sentence with a subject and verb and read it back   Direction following: Able to follow 1-2 step commands    Visuoconstructional: Able to draw a clock circle, all numbers present and in correct quadrants. Able to draw hands for 11:40   Reflexes:   Hoffman's: negative bilaterally  Clonus: negative in bilateral ankles         Intake/Output Summary (Last 24 hours) at 12/23/2023 1120  Last data filed at 12/22/2023 2101  Gross per 24 hour   Intake 1047 ml   Output 312 ml   Net 735 ml        Hematology:    Lab Results   Component Value Date    HGB 8.2 12/23/2023    HGB 15.7 02/11/2023    HCT 23.2 12/23/2023    HCT 45.1 02/11/2023    PLTCT 441 12/23/2023    PLTCT 192 02/11/2023    WBC 3.50 12/23/2023    WBC 7.5 02/11/2023    NEUT 60.0 12/18/2023    ANC 2.9 12/23/2023    LYMPH 4 12/23/2023    ALC 0.20 12/18/2023    MONA 23.4 12/18/2023    AMC 0.20 12/18/2023  EOSA 0.5 12/18/2023    ABC 0.00 12/18/2023    BASOPHILS 1 12/21/2023    MCV 94.2 12/23/2023    MCV 94.7 02/11/2023    MCH 33.2 12/23/2023    MCH 33.1 02/11/2023    MCHC 35.2 12/23/2023    MCHC 34.9 02/11/2023    MPV 8.3 12/23/2023    MPV 10.1 02/11/2023    RDW 17.5 12/23/2023    RDW 14.6 02/11/2023   , Coagulation:    Lab Results   Component Value Date    PT 11.4 12/05/2023    INR 1.0 12/05/2023    and General Chemistry:    Lab Results   Component Value Date    NA 134 12/23/2023    NA 140 02/11/2023    K 4.4 12/23/2023    K 3.9 02/11/2023    CL 97 12/23/2023    CL 104 02/11/2023    CO2 29 12/23/2023    CO2 25 02/11/2023    GAP 8 12/23/2023    GAP 11 02/11/2023    BUN 16 12/23/2023    BUN 6 02/11/2023    CR 0.62 12/23/2023    CR 0.75 02/11/2023    GLU 212 12/23/2023    GLU 141 02/11/2023    CA 8.1 12/23/2023    CA 9.3 02/11/2023    ALBUMIN 3.2 12/23/2023    ALBUMIN 4.4 02/11/2023    LACTIC 1.8 12/15/2023    MG 2.0 12/23/2023    TOTBILI 0.4 12/23/2023    TOTBILI 0.8 02/11/2023    PO4 3.9 12/15/2023        Radiology:  Reviewed    MRI Head wo/w 12/22/23  IMPRESSION   1. Scattered small acute to subacute bilateral cerebral white matter   infarcts. No significant intracranial mass effect.   2.  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     CTA PE 12/15/23  IMPRESSION   1. No large or central pulmonary embolism. Evaluation of distal branches   is limited secondary to respiratory motion.   2.  Development of a few clustered nodular and patchy opacities within the   right greater than left lower lobes, probably infectious or   aspiration-related.   3.  Increased prominence of a few subcentimeter mediastinal nodes, likely   reactive in the above context.   4.  Stable well-circumscribed subpleural nodule in the posterior right   lower lobe which did not demonstrate significant FDG uptake on previous   PET/CT.     Lou Miner, MD

## 2023-12-23 NOTE — Progress Notes
 General Progress Note    Name:  Daryl Baker     Today's Date:  12/23/2023  Admission Date: 12/08/2023  LOS: 15 days                     Assessment/Plan:    Principal Problem:    Oropharyngeal mass  Active Problems:    Oropharyngeal cancer (CMS-HCC)    Severe malnutrition    Mouth pain    Drug-induced constipation    Encephalopathy acute      68 y.o. 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 He has had increasing pain over the last few days prior to admission near his oropharyngeal mass on the left along with the left aspect of his tongue preventing any PO intake and has a scheduled G-tube placement.    Interval Events 12/23/2023:   -Scheduled trazodone 50mg  & melatonin 10mg  qhs for sleep  -MRI brain: infarcts concerning for emboli vs watershed infarction  -LP: NGTD, pending cytology  -TTE: valves not visualized, TEE: ordered  -carotid ultrasound: normal    Multiple Brain Infarcts  Acute metabolic encephalopathy  -Differential Dx: emboli vs watershed infarcts  -Blood sugar  = normal/ elevated  -Na = wnl, Ca = 8.3  -3/17 venous pH = 7.42  -TSH = 0.39  -Hx of cisplatin 5 cycles, which can cause encephalopathy/PRES  -currently denying headache, has been stooling regularly  -Had a course of thiamine during admission  -EEG: mild encephalopathy  -MRI attempted x2 with ativan but patient was unable to tolerate without anesthesia  -MRI brain: Scattered small acute to subacute bilateral cerebral white matter Infarcts concerning for emboli.  -Lumbar Puncture: elevated protein, cultures pending, cytology pending  -Lipid Profile: wnl;  last HbA1c = 7.6 (12/16/23)  -HSV & crypto neg  -TTE: valves not visualized, TEE: ordered  -carotid ultrasound: normal  -HSV & crypto neg  Plan:  > TEE with bubble study  > high dose thiamine   > Scheduled melatonin & trazodone increased to 50mg , continue delirium precautions  > Neurology consulted- start ASA 81, optimize BP & diabetes, not consistent with PRES despite cisplatin, likely watershed infarct  > Start ASA 81    Neutropenic fever  Presumed Aspiration pneumonia  Sepsis- resolved  -tachycardia, fever, increase in WBC (leukopenic at baseline)  -source likely pulmonary given imaging findings  -UA unremarkable  -CTA Chest: Development of a few clustered nodular and patchy opacities within the   right greater than left lower lobes, probably infectious or aspiration-related.   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  Plan:  > Trulicity nonformulary, will hold inpatient  > glyburide 2.5 mg daily & metformin 500 BID for hyperglycemia via G-tube (held 3/23 at midnight for MRI & LP under anesthesia)    Dysphagia   Oropharyngeal pain  -Due to left oropharyngeal cancer  -G-tube placed 3/12 AM  -He will need the G-tube feeds for >90 days.  Plan:  >Magic mouthwash TID PRN for oropharyngeal pain  > Consulted Dietician  >Transitioned to tube feeds 3/15, formal recs  in dietician note  > Palliative Care consult for pain management as above.   -Fentanyl patch 36mcg/hr  -Oxycodone 15mg  q4h PRN  -Dilaudid 0.5-1mg  IV q2h PRN  -Gabapentin 300mg  TID  -Bowel regimen with senokot-s 2ts BID, miralax BID  >Fluconazole 400mg  qday for oral candidiasis     Left HPV-Mediated Oropharyngeal SCC  -Currently on chemo and radiation  -Follow in Oncology clinic with dr Neita Carp and radiation Oncology Dr Mariane Masters  -CT Neck revealed:  1.  Marked decrease size of the prior large left oropharyngeal mass. There   is a residual 1.7 x 1.5 cm lesion involving the left tonsil with central   fluid attenuation. Treated necrotic tumor is the leading consideration. A   post therapeutic retention cyst could appear similar. An abscess is   thought less likely though direct visualization and clinical correlation   is recommended regarding symptoms of infection.   2.  Improvement of bilateral cervical lymphadenopathy.   Plan:  > Radiation Oncology with continue to perform radiation while inpatient.(3/17-3/19)  > Triamcinolone cream for radiation induced dermatitis    Pancytopenia   Neutropenia  -ANC <1  -likely due to Chemo & radiation  -follow with daily CBC w/ Diff     Hypertension  >Continue to hold metoprolol & losartan    FEN:  Bolus tube feeds with free water flushes/Replace electrolytes as needed/Tube feeds  DIET CLEAR LIQUID w/ Body mass index is 31.66 kg/m?Marland Kitchen   DVT PPX:  40mg  qday enoxaparin, SCDs bilat  Code status: Full Code     Patient seen and case discussed with Dr. Franco Nones, MD   Internal Medicine-Psychiatry Resident, PGY1  Available on Voalte   ________________________________________________________________________    Subjective  Patient seen and examined this morning at bedside. Remains encephalopathic. He has been refusing telemetry.    Medications  Scheduled Meds:Diet Enteral Feeding Bolus, , SEE ADMIN INSTRUCTIONS, 5XDAY (6,10,14,18,22   And  Water Bolus, 100 mL, PEG Tube, 5XDAY (6,10,14,18,22  enoxaparin (LOVENOX) syringe 40 mg, 40 mg, Subcutaneous, QDAY(21)  fentaNYL (DURAGESIC) 50 mcg/hr patch 1 patch, 1 patch, Transdermal, Q72H*   And  Verification of Patch Placement and Integrity - fentaNYL 50 mcg/hr, , Transdermal, BID  fluconazole (DIFLUCAN) oral suspension 200 mg, 200 mg, Oral, QDAY  gabapentin (NEURONTIN) capsule 300 mg, 300 mg, Per G Tube, TID  glyBURIDE (DIABETA) tablet 2.5 mg, 2.5 mg, Oral, QDAY  melatonin tablet 10 mg, 10 mg, Oral, QHS  metFORMIN (GLUCOPHAGE) tablet 500 mg, 500 mg, Oral, BID w/meals  polyethylene glycol 3350 (MIRALAX) packet 17 g, 1 packet, Per G Tube, QDAY  sennosides-docusate sodium (SENOKOT-S) tablet 2 tablet, 2 tablet, Per G Tube, QDAY  sodium chloride 0.9% bacteriostatic injection 20 mL, 20 mL, Injection, ONCE  sodium chloride PF 0.9% injection 10 mL, 10 mL, Intravenous, ONCE  thiamine hcl (vitamin B1) injection 500 mg, 500 mg, Intravenous, Q8H  traZODone (DESYREL) tablet 75 mg, 75 mg, Oral, QHS  triamcinolone acetonide (KENALOG) 0.1 % topical cream 1 g, 1 g, Topical, BID    Continuous Infusions:      PRN and Respiratory Meds:acetaminophen Q6H PRN, bisacodyL QDAY PRN, dextrose 50% (D50) IV PRN, diphenhydrAMINE/lidocaine/mag,al-simeth(#) TID PRN, HYDROmorphone (DILAUDID) injection Q2H PRN, ondansetron Q6H PRN **OR** ondansetron (ZOFRAN) IV Q6H PRN, oxyCODONE Q3H PRN, pancrelipase 20,880 Units/sodium bicarbonate 650 mg ([place] CLOG DESTROYER) PRN (On Call from Rx), perflutren lipid microspheres Once PRN **AND** sodium chloride PF 0.9% ONCE, sodium chloride PRN        Objective:  Vital Signs: Last Filed                 Vital Signs: 24 Hour Range   BP: 91/52 (03/25 0411)  Temp: 36.7 ?C (98 ?F) (03/25 0411)  Pulse: 101 (03/25 0411)  Respirations: 19 PER MINUTE (03/24 1934)  SpO2: 93 % (03/25 0411)  O2 Device: None (Room air) (03/25 0411)  O2 Liter Flow: 2 Lpm (03/24 1630) BP: (91-129)/(47-75)   Temp:  [36.6 ?C (97.8 ?F)-36.9 ?C (98.4 ?F)]   Pulse:  [97-106]   Respirations:  [19 PER MINUTE-20 PER MINUTE]   SpO2:  [92 %-97 %]   O2 Device: None (Room air)  O2 Liter Flow: 2 Lpm   Intensity Pain Scale (Self Report): 8 (12/22/23 1038) Vitals:    12/08/23 0907 12/08/23 1445 12/09/23 1715   Weight: 102 kg (224 lb 13.9 oz) 101.5 kg (223 lb 12.8 oz) 101.5 kg (223 lb 12.8 oz)       Intake/Output Summary:  (Last 24 hours)    Intake/Output Summary (Last 24 hours) at 12/23/2023 0981  Last data filed at 12/22/2023 2101  Gross per 24 hour   Intake 1607 ml   Output 312 ml   Net 1295 ml           Physical Exam  Constitutional:       General: He is not in acute distress.     Appearance: Normal appearance.   HENT:      Head: Normocephalic and atraumatic.      Nose: Nose normal.   Eyes:      Conjunctiva/sclera: Conjunctivae normal.   Cardiovascular:      Rate and Rhythm: Normal rate and regular rhythm.      Heart sounds: Normal heart sounds. No murmur heard.  Pulmonary:      Effort: Pulmonary effort is normal. No respiratory distress.      Breath sounds: Normal breath sounds.   Skin:     General: Skin is warm and dry.   Neurological:      Mental Status: He is alert. He is disoriented.      Comments: Remains encephalopathic            Point of Care Testing  (Last 24 hours)  POC Glucose (Download): (!) 396 (12/22/23 2101)    Radiology and other Diagnostics Review:    CT NECK W/CONTRAST  Result Date: 12/08/2023  1.  Marked decrease size of the prior large left oropharyngeal mass. There is a residual 1.7 x 1.5 cm lesion involving the left tonsil with central fluid attenuation. Treated necrotic tumor is the leading consideration. A post therapeutic retention cyst could appear similar. An abscess is thought less likely though direct visualization and clinical correlation is recommended regarding symptoms of infection. 2.  Improvement of bilateral cervical lymphadenopathy. By my electronic signature, I attest that I have personally reviewed the images for this examination and formulated the interpretations and opinions expressed in this report  Finalized by Marily Memos, M.D. on 12/08/2023 12:00 PM. Dictated by Jerene Pitch, MD on 12/08/2023 11:07 AM.    CT HEAD WO CONTRAST  Result Date: 12/08/2023  1.  No acute intracranial hemorrhage or mass effect. 2.  Mild patchy cerebral white matter hypodensities, likely sequelae of chronic microvascular ischemia. 3.  The CT neck is dictated separately. Please see that report. By my electronic signature, I attest that I have personally reviewed the images for this examination and formulated the interpretations and opinions expressed in this report  Finalized by Marily Memos, M.D. on 12/08/2023 12:00 PM. Dictated by Jerene Pitch, MD on 12/08/2023 10:51 AM.

## 2023-12-23 NOTE — Progress Notes
 Report Sheet    TEE           Indication: Embolic event   Ordering Provider: Synetta Fail     Name: Daryl Baker     Age: 68 y.o.    DOB: 05-12-1956     MRN: [id number]    RM #:  [id number]    RN:     Phone #:     Isolation: Droplet     Communication Barrier:     NPO    A&O    Travel    IV    O2    Additional Information:  Fentanyl patch     Lab(s) needed:   Other:   Device check needed: None     Device: No results found for: GENERATOR, EPDEVTYP    Prior TEE:      Prior DCCV:      Prior Echo: 12/23/23  Previous TEE/DCCV details:     EF:   ECHO EF   Date Value Ref Range Status   12/23/2023 60 % Final       Anticoag:     Dose:     Frequency:     Last Dose:     Missed:    LAB:   Lab Results   Component Value Date/Time    INR 1.0 12/05/2023 01:25 PM    HGB 8.2 (L) 12/23/2023 06:30 AM    PLTCT 441 (H) 12/23/2023 06:30 AM    GLU 212 (H) 12/23/2023 06:30 AM    NA 134 (L) 12/23/2023 06:30 AM    K 4.4 12/23/2023 06:30 AM    CR 0.62 12/23/2023 06:30 AM       Probe   Gastric Surgery    GERD    Swallow difficulty    Head/Neck Surg/Rad    Chest Surg/Rad    EGD    Varices/Esophag CA    GI bleed    Dental issues      Sedation   COPD    OSA/CPAP    Asthma    Pulm HTN    Anesthesia Issues    Smoker    ETOH & Frequency    Drug Use    Chronic Pain Med      Current Medications:    fentaNYL (DURAGESIC) 12 mcg/hr patch Apply one patch to top of skin as directed every 72 hours. Indications: severe chronic pain with opioid tolerance  Use with patch    fentaNYL (DURAGESIC) 25 mcg/hr patch Apply one patch to top of skin as directed every 72 hours. 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

## 2023-12-23 NOTE — Progress Notes
 PALLIATIVE CARE INPATIENT NOTE     Name: Daryl Baker            MRN: [id number]                DOB: [birth year]          Age: 68 y.o.  Admission Date: 12/08/2023             LOS: 15 days    ASSESSMENT/PLAN   Daryl Baker is a 68 y.o. male with with a past medical history of hypertension, diabetes type 2, left-sided HPV mediated oropharyngeal cancer undergoing radiation therapy who presented to [facility] on 12/08/2023 for dysphagia, oropharyngeal pain and inability to tolerate p.o. intake. The palliative care team was consulted for assistance with symptom management for radiation induced mucositis.    # Pain, secondary to malignancy  # Oropharyngeal carcinoma, left, HPV mediated  # Dysphagia  # Poor p.o. intake    Discussion: Visited with Daryl Baker this morning. No family at bedside. Patient is less confused and more talkative today. Daryl Baker was informed on 3/24 he had multiple small strokes. This information was understandably shocking to both Daryl Baker and his wife. He did not express any specific concerns or feelings regarding this diagnosis with the palliative team.      The team notices his diction and mental status have improved greatly over the course of this hospitalization.     RECOMMENDATIONS: (No new changes)  Improving mental status   Continue fentanyl patch to 50 mcg/h  Continue Oxycodone 15 mg Q3H PRN - (used 3 doses in past 24 hours)  Continue Dilaudid 0.5 to 1mg  IV Q2 hours PRN - (used 1.5 mg in past 24 hours)  Continue gabapentin 300 mg 3 times daily  Continue senna 2 tab BID, continue miralax 1 packet twice daily.   Awaiting further Neurologic work up   Ulcerative lesion in mouth is improving may still need days to weeks for ongoing healing  Patient's goals remain aggressive at this time     Kandis Mannan, Medical Student  Patient seen and discussed with Dr. Stevphen Rochester    ATTESTATION    I personally performed or re-performed the history, physical exam and treatment for the E/M. I discussed the case with the Medical Student, and concur with the Medical Student documentation of history, physical exam and treatment plan unless otherwise noted.     Total Time Today was 40 minutes in the following activities: Preparing to see the patient, Obtaining and/or reviewing separately obtained history, Performing a medically appropriate examination and/or evaluation, Counseling and educating the patient/family/caregiver, Referring and communication with other health care professionals (when not separately reported), Documenting clinical information in the electronic or other health record, Independently interpreting results (not separately reported) and communicating results to the patient/family/caregiver, and Care coordination (not separately reported) my additions in bold italics throughout       Narda Bonds DO   Palliative Care  Available by Amie Critchley and AMS     PALLIATIVE CARE PLANNING     Advance Care Planning:   Identified Health Care Decision Maker:    DPOA - Would benefit from completion of DPOA  TPOPP - Not discussed    PC Clinic - NA    Medication safety - Advise a Narcan prescription on discharge    Disposition planning: Pending     SUBJECTIVE     CC/Reason for Visit: Symptoms (pain)    Interval HPI: Patient reports his pain is still very  significant, rating it a 9/10. Over the course of this hospitalization, his pain has improved with treatment. He endorses having a large bowel movement this morning which is contributing to his overall comfort.     ROS: Review of Systems   HENT:  Positive for sinus pain.    Cardiovascular:  Positive for leg swelling.   Gastrointestinal:         Constipation, improving      OBJECTIVE     Blood pressure 118/60, pulse 78, temperature 36.9 ?C (98.4 ?F), height 179.1 cm (5' 10.51), weight 101.5 kg (223 lb 12.3 oz), SpO2 95%.  Physical Exam  Constitutional:       Comments: Uncomfortable appearing male, sitting up in in bed, looks less uncomfortable.   HENT:      Head: Normocephalic.      Mouth/Throat:      Comments: One small (<1cm) ulcerative lesion in left lower buccal mucosa, nonbleeeding   Eyes:      Extraocular Movements: Extraocular movements intact.   Cardiovascular:      Rate and Rhythm: Normal rate.   Pulmonary:      Effort: Pulmonary effort is normal.   Abdominal:      General: There is distension.      Palpations: Abdomen is soft.      Tenderness: There is no abdominal tenderness.   Musculoskeletal:         General: Normal range of motion.      Cervical back: Normal range of motion.      Right lower leg: Edema present.      Left lower leg: Edema present.   Skin:     General: Skin is warm and dry.      Coloration: Skin is pale.   Neurological:      Mental Status: He is alert. He is confused.   Psychiatric:         Attention and Perception: He is inattentive.         Behavior: Behavior is cooperative.       Lab Results:  CBC   Lab Results   Component Value Date/Time    WBC 3.50 (L) 12/23/2023 06:30 AM    HGB 8.2 (L) 12/23/2023 06:30 AM    PLTCT 441 (H) 12/23/2023 06:30 AM     Lab Results   Component Value Date/Time    NEUT 60.0 12/18/2023 05:59 AM    ANC 2.9 12/23/2023 06:30 AM      Chemistries   Lab Results   Component Value Date/Time    NA 134 (L) 12/23/2023 06:30 AM    K 4.4 12/23/2023 06:30 AM    BUN 16 12/23/2023 06:30 AM    CR 0.62 12/23/2023 06:30 AM    GLU 212 (H) 12/23/2023 06:30 AM     Lab Results   Component Value Date/Time    CA 8.1 (L) 12/23/2023 06:30 AM    PO4 3.9 12/15/2023 04:27 AM    ALBUMIN 3.2 (L) 12/23/2023 06:30 AM    TOTPROT 5.8 (L) 12/23/2023 06:30 AM    ALKPHOS 47 12/23/2023 06:30 AM    AST 15 12/23/2023 06:30 AM    ALT 10 12/23/2023 06:30 AM    TOTBILI 0.4 12/23/2023 06:30 AM    GFR >60 12/23/2023 06:30 AM        Other Pertinent Diagnostic Results:     MRI 12/22/23  1.  Scattered small acute to subacute bilateral cerebral white matter   infarcts. No significant intracranial mass effect.  2.  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     Kandis Mannan, Medical Student  Patient seen and discussed with Dr. Stevphen Rochester

## 2023-12-23 NOTE — Progress Notes
 Neurology Dr to bedside at 2245 3/24 to inform pt that he has had multiple small strokes and could die if his BP gets too low. He assessed pt for stroke symptoms and left. Pt, who has been confused, immediately called wife to inform her that he had a stroke. She is understandably upset and requested that the Dr call her to talk off the ledge. RN reached out to overnight team to request a call to wife with information.

## 2023-12-23 NOTE — Case Management (ED)
 Notified by Victorino Dike (SW) that the patient is anticipated to be ready for discharge end of week and would prefer to stay at [facility] IP rehab unit. Wife can provide supervision.     Romie Minus, BSN, RN   [facility] Admission Nurse (office: [phone] or voalte)

## 2023-12-23 NOTE — Progress Notes
 End of Treatment Summary Note    Date:  12/23/2023    Daryl Baker is a 68 y.o. male.     There were no encounter diagnoses.  Staging:  Cancer Staging   Oropharyngeal cancer (CMS-HCC)  Staging form: Pharynx - HPV-Mediated Oropharynx, AJCC 8th Edition  - Clinical stage from 10/14/2023: Stage II (cT3, cN2, cM0, p16+) - Signed by Monico Blitz, MD on 10/14/2023      Treatment Data Summary:   IMRT: Intensity Modulated Radiation Therapy       12/09/2023     2:16 PM 12/10/2023     2:07 PM 12/11/2023     7:43 AM 12/12/2023     7:26 AM 12/16/2023     9:06 AM 12/17/2023     9:59 AM 12/18/2023    10:48 AM   Treatment Data   Course ID C1 Oropharynx 25  C1 Oropharynx 25  C1 Oropharynx 25  C1 Oropharynx 25  C1 Oropharynx 25  C1 Oropharynx 25  C1 Oropharynx 25    Plan ID H&N  H&N  H&N  H&N  H&N  H&N  H&N    Prescription Dose (cGy) 6,996  6,996  6,996  6,996  6,996  6,996  6,996    Prescribed Dose per Fraction (Gy) 2.12  2.12  2.12  2.12  2.12  2.12  2.12    Fractions Treated to Date 27  28  29  30  31   32  33    Total Fractions on Plan 33  33  33  33  33  33  33    Treatment Elapsed Days 36  37  38  39  43  44  45    Reference Point ID Oropharynx/H&N  Oropharynx/H&N  Oropharynx/H&N  Oropharynx/H&N  Oropharynx/H&N  Oropharynx/H&N  Oropharynx/H&N    Dosage Given to Date 57.24  59.36  61.48  63.6  65.72  67.84  69.96         Treatment Tolerance and Response:  The patient experienced the expected acute GI, mucosa, skin and salivary side effects of treatment which were managed with appropriate supportive measures. He was admitted for failure to thrive and altered mental status and completed treatment while hospitalized.      Follow Up:  The patient will be scheduled for a follow up with me at 2-4 weeks.

## 2023-12-24 LAB — POC GLUCOSE
~~LOC~~ BKR POC GLUCOSE: 154 mg/dL — ABNORMAL HIGH (ref 70–100)
~~LOC~~ BKR POC GLUCOSE: 129 mg/dL — ABNORMAL HIGH (ref 70–100)
~~LOC~~ BKR POC GLUCOSE: 58 mg/dL — ABNORMAL LOW (ref 70–100)
~~LOC~~ BKR POC GLUCOSE: 75 mg/dL (ref 70–100)
~~LOC~~ BKR POC GLUCOSE: 88 mg/dL (ref 70–100)
~~LOC~~ BKR POC GLUCOSE: 90 mg/dL (ref 70–100)

## 2023-12-24 LAB — CBC AND DIFF
~~LOC~~ BKR MCHC: 34 g/dL (ref 32.0–36.0)
~~LOC~~ BKR MPV: 8.2 fL — ABNORMAL LOW (ref 7.0–11.0)
~~LOC~~ BKR PLATELET COUNT: 472 10*3/uL — ABNORMAL HIGH (ref 150–400)
~~LOC~~ BKR RDW: 18 % — ABNORMAL HIGH (ref 11.0–15.0)

## 2023-12-24 LAB — COMPREHENSIVE METABOLIC PANEL
~~LOC~~ BKR ALBUMIN: 3 g/dL — ABNORMAL LOW (ref 3.5–5.0)
~~LOC~~ BKR ALK PHOSPHATASE: 50 U/L — ABNORMAL HIGH (ref 25–110)
~~LOC~~ BKR ALT: 11 U/L — ABNORMAL HIGH (ref 7–56)
~~LOC~~ BKR ANION GAP: 10 10*6/uL — ABNORMAL LOW (ref 3–12)
~~LOC~~ BKR AST: 18 U/L — ABNORMAL HIGH (ref 7–40)
~~LOC~~ BKR CO2: 29 mmol/L — ABNORMAL HIGH (ref 21–30)
~~LOC~~ BKR GLOMERULAR FILTRATION RATE (GFR): >60 mL/min — ABNORMAL LOW (ref >60–16.5)
~~LOC~~ BKR TOTAL BILIRUBIN: 0.3 mg/dL — ABNORMAL LOW (ref 0.2–1.3)

## 2023-12-24 LAB — MANUAL DIFF
~~LOC~~ BKR BANDS - RELATIVE: 3 % (ref 0–10)
~~LOC~~ BKR BLASTS - RELATIVE: 2 % (ref 4–12)
~~LOC~~ BKR NEUT+BANDS - ABSOLUTE: 5.2 10*3/uL (ref 1.8–7.0)

## 2023-12-24 LAB — CYTOLOGY NON-GYN

## 2023-12-24 LAB — VZV ANTIBODY IGG CSF: ~~LOC~~ BKR VZV ANTIBODY IGG CSF: 0 {s_co_ratio}

## 2023-12-24 MED ORDER — THIAMINE MONONITRATE (VIT B1) 100 MG PO TAB
100 mg | Freq: Every day | GASTROSTOMY | 0 refills | Status: DC
Start: 2023-12-24 — End: 2024-01-02
  Administered 2023-12-26 – 2024-01-02 (×8): 100 mg via GASTROSTOMY

## 2023-12-24 NOTE — Case Management (ED)
 Per chart review, TEE ordered and neuro workup ongoing. Duplex scan completed yesterday (WNL). Manual diff ordered and in process this AM. IV Thiamine and IV Dilaudid will need to be discontinued and changed to oral. Platelets trending up. Requested Victorino Dike (SW) to f/u on EDD.     Romie Minus, BSN, RN   [facility] Admission Nurse (office: [phone] or voalte)

## 2023-12-24 NOTE — Care Coordination-Inpatient
 Palliative Care does not plan to visit patient today.   We will continue to follow peripherally with intermittent visits throughout the week.  If the patient is needing to be seen sooner or the team is needing further assistance please contact us.     Thank you for allowing [place] to be a part of this patient's care.    Palliative Care Team 5  Lekeisha Arenas NP  Dr. Kyra Searles SW     Michon Kaczmarek Lyn Hollingshead, APRN-NP  Available by [facility]  Palliative Medicine Division  Team On Call Pager [phone]

## 2023-12-24 NOTE — Progress Notes
 OCCUPATIONAL THERAPY  PROGRESS NOTE      Name: Daryl Baker   MRN: [id number]     DOB: [birth year]      Age: 68 y.o.  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 intake.  Mental / Cognitive: Alert;Oriented;Cooperative;Follows commands;Tangential  Pain: No complaint of pain  Pain Interventions: Patient assisted into position of comfort    Home Living Situation  Lives With: Spouse/significant other  Type of Home: House  Entry Stairs: No stairs;Ramp  In-Home Stairs: Able to live on main level  Patient Owned Equipment: Walker: Rollator  Comments: Patient lives with spouse and special needs child. Patient lives on main level, spouse and child live upstairs. Patients spouse with recent shoulder surgery and providing cares for child, so generally unable to provide assistance for patient. Can provide intermittent supervision. Spouse concerned about increase in patients confusion and ability to take care of himself, as he has largely been in bed since admission. She reports patient has called her in the middle of the night asked her to pick him up because he feels like he is being held hostage.    Prior Level of Function  Level Of Independence: Independent with ADL and community mobility with device  History of Falls in Past 3 Months: No    ADL's  UE Dressing Assist: Stand By Assist  UE Dressing Deficits: Setup  Toileting Assist: Minimal Assist  Toileting Deficits: Steadying;Supervision/Safety  Comment: pt dons t shirt seated EOB with setup. ambulates to bathroom for toileting, steadying assist in standing for clothing management    ADL Mobility  Bed Mobility: Supine to Sit: Standby assist  Bed Mobility: Sit to Supine: Standby assist  Transfer Type: Sit to/from stand  Transfer: Assistance Level: From;Bed;Toilet;Minimal assist  Transfer: Assistive Device: 4-wheeled walker  End of Activity Status: In bed;Instructed patient to request assist with mobility;Instructed patient to use call light  Transfer Comments: steadying assist. pt requests to use personal rollator despite education of risks as it is missing one brake  Gait Distance: 100 feet  Gait: Assistance Level: Minimal assist  Gait: Assistive Device: 4-wheeled walker  Gait Comments: pt ambulates in room. steadying assist and intermittent cues for safety    Cognition  Cognition Comment: pt continues with intermittent confusion and tangential speech. requires cues for safety    Assessment  Assessment: Decreased ADL Status;Decreased Safe/Judg during ADL;Decreased Cognition;Decreased Endurance;Decreased Self-Care Trans;Decreased High-Level ADLs  Goal Formulation: Patient  Comments: pt is limited by continued confusion as well as generalized weakness. would benefit from continued therapy in an inpatient setting as pt must be independent to return home. pending acute length of stay, may progress for home discharge. could consider OT KELS evaluation closer to expected dc to evaluate cognitive needs    AM-PAC 6 Clicks Daily Activity Inpatient  Putting on and taking off regular lower body clothes: A Little  Bathing (Including washing, rinsing, drying): A Little  Toileting, which includes using toilet, bedpan, or  urinal: A Little  Putting on and taking off regular upper body clothing: None  Taking care of personal grooming such as brushing teeth: None  Eating meals: None  Daily Activity Raw Score: 21  Standardized (T-scale) Score: 44.27    Plan  OT Frequency: 5x/week  OT Plan for Next Visit: toileting in bathroom, grooming at sink, progress endurance    ADL Goals  Patient Will Perform All ADL's: w/ Modified Independence    Functional Transfer Goals  Pt Will Perform All Functional Transfers: Modified Independent    OT Discharge Recommendations  Recommendation: Inpatient setting;Recommend rehab medicine consult    Therapist: Lorella Nimrod, OTR/L [id number]  Date: 12/24/2023

## 2023-12-24 NOTE — Case Management (ED)
 Case Management Progress Note    NAME:Daryl Baker                          MRN: [id number]              DOB:1956-09-19          AGE: 68 y.o.  ADMISSION DATE: 12/08/2023             DAYS ADMITTED: LOS: 16 days      Today's Date: 12/24/2023    PLAN: Anticipate [facility] pending medical stability, and insurance auth approval.     Expected Discharge Date: 01/02/2024   Is Patient Medically Stable: No, Please explain: new strokes- ongoing workup  Are there Barriers to Discharge? no    INTERVENTION/DISPOSITION:  Discharge Planning              Discharge Planning: Inpatient Rehabilitation    SW participated in huddle; EMR reviewed.    Pt is not stable for d/c at this time. Current recommendations are for inpatient setting.    Pt currently being followed by [facility] admissions. They will submit for auth once pt is closer to being medically stable, hopefully by the end of this week vs early next week.     SW will continue to follow for d/c planning and placement needs.       Transportation              Does the Patient Need Case Management to Arrange Discharge Transport? (ex: facility, ambulance, wheelchair/stretcher, Medicaid, cab, other): No  Transportation Name, Phone and Availability #1: zaheer, wageman (Spouse)  [phone] (Mobile)    Support              Support: Pt/Family Updates re:POC or DC Plan, Huddle/team update    Info or Referral              Information or Referral to Community Resources: No Needs Identified    Positive SDOH Domains and Potential Barriers   Positive for SDOH Domain:  (No Needs Identified)     Medication Needs              Medication Needs: No Needs Identified    Financial              Financial: No Needs Identified    Legal              Legal: No Needs Identified    Other              Other/None: No needs identified    Discharge Disposition                               Selected Continued Care - Admitted Since 12/08/2023       [facility] Coordination complete.      Service Provider Services Address Phone Fax Patient Preferred    [facility] [address], [place] [address] [phone] --              [facility] Coordination complete.      Service Provider Services Address Phone Fax Patient Preferred    [facility] [address], [place] [address] [phone] --                    Gray Bernhardt, LSCSW,  LCSW  Inpatient Social Work Case Manager  Med Onc, MPP  Available on Voalte   Work Cell: [phone]

## 2023-12-24 NOTE — Case Management (ED)
 Case Management Progress Note    NAME:Coree Amon Costilla                          MRN: [id number]              DOB:11/23/1955          AGE: 68 y.o.  ADMISSION DATE: 12/08/2023             DAYS ADMITTED: LOS: 16 days      Today's Date: 12/24/2023    PLAN: Palliative care continues to follow for support and symptom management    Expected Discharge Date: 01/02/2024   Is Patient Medically Stable: No, Please explain: symptoms, stroke work up  Are there Barriers to Discharge? no    INTERVENTION/DISPOSITION:  Discharge Planning              Discharge Planning: Inpatient Rehabilitation  Transportation              Does the Patient Need Case Management to Arrange Discharge Transport? (ex: facility, ambulance, wheelchair/stretcher, Medicaid, cab, other): No  Transportation Name, Phone and Availability #1: karlton, maya (Spouse)  [phone] (Mobile)  Support              Support: Pt/Family Updates re:POC or DC Plan, Huddle/team update   SW reviewed EMR and participated in palliative huddle   Stroke work up planned today, including TEE     Info or Referral              Information or Referral to [facility]: No Needs Identified  Positive SDOH Domains and Potential Barriers   Positive for SDOH Domain:  (No Needs Identified)               Medication Needs              Medication Needs: No Needs Identified                                                                                                                                          Financial              Financial: No Needs Identified  Legal              Legal: No Needs Identified  Other              Other/None: No needs identified  Discharge Disposition  Selected Continued Care - Admitted Since 12/08/2023       [facility] Coordination complete.      Service Provider Services Address Phone Fax Patient Preferred    [facility] [address], [place] [address] [phone] --              [facility] Coordination complete.      Service Provider Services Address Phone Fax Patient Preferred    [facility] [address], [place] [address] [phone] --                  Briscoe Burns, LSCSW  Voalte

## 2023-12-24 NOTE — Progress Notes
 PHYSICAL THERAPY  PROGRESS NOTE          Name: Daryl Baker   MRN: [id number]     DOB: 02-Jul-1956      Age: 68 y.o.  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 intake.  Mental / Cognitive: Alert;Oriented;Cooperative;Follows commands;Tangential  Pain: No complaint of pain  Pain Interventions: Patient assisted into position of comfort  Persons Present: Family    Home Living Situation  Lives With: Spouse/significant other  Type of Home: House  Entry Stairs: No stairs;Ramp  In-Home Stairs: Able to live on main level  Patient Owned Equipment: Walker: Rollator  Comments: Patient lives with spouse and special needs child. Patient lives on main level, spouse and child live upstairs. Patients spouse with recent shoulder surgery and providing cares for child, so generally unable to provide assistance for patient. Can provide intermittent supervision. Spouse concerned about increase in patients confusion and ability to take care of himself, as he has largely been in bed since admission. She reports patient has called her in the middle of the night asked her to pick him up because he feels like he is being held hostage.    Prior Level of Function  Level Of Independence: Independent with ADL and community mobility with device  History of Falls in Past 3 Months: No    Bed Mobility/Transfer  Comments: Patient sitting up in bedside chair upon arrival to room and end of session.  Transfer Type: Sit to/from Stand  Transfer: Assistance Level: To/From;Bed Side Chair;Minimal Assist  Transfer: Assistive Device: 4-Wheeled Walker  Transfers: Type Of Assistance: For Balance;For Safety Considerations  End Of Activity Status: Up in Chair;Nursing Notified;Instructed Patient to Request Assist with Mobility;Instructed Patient to Use Call Light (alarm activated)    Gait  Gait Distance: 350 feet  Gait: Assistance Level: Minimal Assist  Gait: Assistive Device: Roller Walker  Gait: Descriptors: Pace: Slow;Swing-Through Gait;Decreased step length  Comments: Cues to keep walker close to body. Two standing rest breaks due to fatigue and SOA.  Activity Limited By: Complaint of Fatigue    Assessment/Progress  Impaired Mobility Due To: Impaired Balance;Deconditioning  Assessment/Progress: Should Improve w/ Continued PT;Expect Good Progress    AM-PAC 6 Clicks Basic Mobility Inpatient  Turning from your back to your side while in a flat bed without using bed rails: None  Moving from lying on your back to sitting on the side of a flat bed without using bedrails : None  Moving to and from a bed to a chair (including a wheelchair): None  Standing up from a chair using your arms (e.g. wheelchair, or bedside chair): None  To walk in hospital room: A Little  Climbing 3-5 steps with a railing: A Little  Basic Mobility Inpatient Raw Score: 22  Standardized (T-scale) Score: 47.4  Mobility Goal [facility]: JH-HLM 7 Walk >= 25 ft    Goals  Goal Formulation: With Patient  Time For Goal Achievement: 5  days  Patient Will Go Supine To/From Sit: Independently  Patient Will Transfer Bed/Chair: Independently  Patient Will Transfer Sit to Stand: Independently  Patient Will Ambulate: Greater than 200 Feet, w/ Walker, Independently    Plan  Treatment Interventions: Mobility training;Strengthening;Balance activities;Endurance training  Plan Frequency: 5 Days per Week  PT Plan for Next Visit: Progress gait distance/independence, balance test. Decrease frequency and update recs pending progression.    PT Discharge Recommendations  Recommendation: Inpatient setting;Recommend rehab medicine consult  Patient Currently Requires Physical Assist With: Ambulation  Patient Currently Requires Equipment: Owns what is needed  Comments: Patient may be able to progress to home discharge pending LOS.    Therapist: Lauretta Chester, PTA  Date: 12/24/2023

## 2023-12-24 NOTE — Progress Notes
 General Progress Note    Name:  Daryl Baker     Today's Date:  12/24/2023  Admission Date: 12/08/2023  LOS: 16 days                     Assessment/Plan:    Principal Problem:    Oropharyngeal mass  Active Problems:    Oropharyngeal cancer (CMS-HCC)    Severe malnutrition    Mouth pain    Drug-induced constipation    Encephalopathy acute      68 y.o. 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 He has had increasing pain over the last few days prior to admission near his oropharyngeal mass on the left along with the left aspect of his tongue preventing any PO intake and has a scheduled G-tube placement.    Interval Events 12/24/2023:   -NPO & holding tube feeds at midnight for TEE on 3/27    Multiple Brain Infarcts  Acute metabolic encephalopathy  -Differential Dx: emboli vs watershed infarcts  -Blood sugar  = normal/ elevated  -Na = wnl, Ca = 8.3  -3/17 venous pH = 7.42  -TSH = 0.39  -Hx of cisplatin 5 cycles, which can cause encephalopathy/PRES  -currently denying headache, has been stooling regularly  -Had a course of thiamine during admission  -EEG: mild encephalopathy  -MRI attempted x2 with ativan but patient was unable to tolerate without anesthesia  -MRI brain: Scattered small acute to subacute bilateral cerebral white matter Infarcts concerning for emboli.  -Lumbar Puncture: elevated protein, cultures NGTD, cytology pending  -Lipid Profile: wnl;  last HbA1c = 7.6 (12/16/23)  -HSV & crypto neg  -TTE: valves not visualized, TEE: ordered  -carotid ultrasound: normal  -HSV & crypto neg  Plan:  > TEE with bubble study  > high dose thiamine   > Scheduled melatonin & trazodone increased to 50mg , continue delirium precautions  > Neurology consulted- start ASA 81, optimize BP & diabetes, not consistent with PRES despite cisplatin, likely watershed infarct  > Start ASA 81    Neutropenic fever  Presumed Aspiration pneumonia  Sepsis- resolved  -tachycardia, fever, increase in WBC (leukopenic at baseline)  -source likely pulmonary given imaging findings  -UA unremarkable  -CTA Chest: Development of a few clustered nodular and patchy opacities within the   right greater than left lower lobes, probably infectious or aspiration-related.   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  Plan:  > Trulicity nonformulary, will hold inpatient  > glyburide 2.5 mg daily & metformin 500 BID for hyperglycemia via G-tube (held 3/23 at midnight for MRI & LP under anesthesia)    Dysphagia   Oropharyngeal pain  -Due to left oropharyngeal cancer  -G-tube placed 3/12 AM  -He will need the G-tube feeds for >90 days.  Plan:  >Magic mouthwash TID PRN for oropharyngeal pain  > Consulted Dietician  >Transitioned to tube feeds 3/15, formal recs in dietician note  > Palliative Care consult for pain management as above.   -Fentanyl patch 5mcg/hr  -Oxycodone 15mg  q4h PRN  -  Dilaudid 0.5-1mg  IV q2h PRN  -Gabapentin 300mg  TID  -Bowel regimen with senokot-s 2ts BID, miralax BID  >Fluconazole 400mg  qday for oral candidiasis     Left HPV-Mediated Oropharyngeal SCC  -Currently on chemo and radiation  -Follow in Oncology clinic with dr Neita Carp and radiation Oncology Dr Mariane Masters  -CT Neck revealed:  1.  Marked decrease size of the prior large left oropharyngeal mass. There   is a residual 1.7 x 1.5 cm lesion involving the left tonsil with central   fluid attenuation. Treated necrotic tumor is the leading consideration. A   post therapeutic retention cyst could appear similar. An abscess is   thought less likely though direct visualization and clinical correlation   is recommended regarding symptoms of infection.   2.  Improvement of bilateral cervical lymphadenopathy.   Plan:  > Radiation Oncology with continue to perform radiation while inpatient.(3/17-3/19)  > Triamcinolone cream for radiation induced dermatitis    Pancytopenia   Neutropenia  -ANC <1  -likely due to Chemo & radiation  -follow with daily CBC w/ Diff     Hypertension  >Continue to hold metoprolol & losartan    FEN:  Bolus tube feeds with free water flushes/Replace electrolytes as needed/Tube feeds  DIET NPO AT MIDNIGHT Sips With Medications w/ Body mass index is 31.64 kg/m?Marland Kitchen   DVT PPX:  40mg  qday enoxaparin, SCDs bilat  Code status: Full Code     Patient seen and case discussed with Dr. Franco Nones, MD   Internal Medicine-Psychiatry Resident, PGY1  Available on Voalte   ________________________________________________________________________    Subjective  Patient seen and examined this morning at bedside. He is able to converse appropriately and is A&Ox4. He continues to make some impulsive decisions such as spitting in his urinal. His wife is at bedside.    Medications  Scheduled Meds:aspirin chewable tablet 81 mg, 81 mg, PEG Tube, QDAY  [Held by Provider] Diet Enteral Feeding Bolus, , SEE ADMIN INSTRUCTIONS, 5XDAY (6,10,14,18,22   And  Water Bolus, 100 mL, PEG Tube, 5XDAY (6,10,14,18,22  enoxaparin (LOVENOX) syringe 40 mg, 40 mg, Subcutaneous, QDAY(21)  fentaNYL (DURAGESIC) 50 mcg/hr patch 1 patch, 1 patch, Transdermal, Q72H*   And  Verification of Patch Placement and Integrity - fentaNYL 50 mcg/hr, , Transdermal, BID  fluconazole (DIFLUCAN) oral suspension 200 mg, 200 mg, Oral, QDAY  gabapentin (NEURONTIN) capsule 300 mg, 300 mg, Per G Tube, TID  [Held by Provider] glyBURIDE (DIABETA) tablet 2.5 mg, 2.5 mg, Oral, QDAY  melatonin tablet 10 mg, 10 mg, Oral, QHS  metFORMIN (GLUCOPHAGE) tablet 500 mg, 500 mg, Oral, BID w/meals  polyethylene glycol 3350 (MIRALAX) packet 17 g, 1 packet, Per G Tube, QDAY  sennosides-docusate sodium (SENOKOT-S) tablet 2 tablet, 2 tablet, Per G Tube, QDAY  thiamine hcl (vitamin B1) injection 500 mg, 500 mg, Intravenous, Q8H  traZODone (DESYREL) tablet 75 mg, 75 mg, Oral, QHS  triamcinolone acetonide (KENALOG) 0.1 % topical cream 1 g, 1 g, Topical, BID    Continuous Infusions:      PRN and Respiratory Meds:acetaminophen Q6H PRN, bisacodyL QDAY PRN, dextrose 50% (D50) IV PRN, diphenhydrAMINE/lidocaine/mag,al-simeth(#) TID PRN, HYDROmorphone (DILAUDID) injection Q2H PRN, ondansetron Q6H PRN **OR** ondansetron (ZOFRAN) IV Q6H PRN, oxyCODONE Q3H PRN, pancrelipase 20,880 Units/sodium bicarbonate 650 mg ( CLOG DESTROYER) PRN (On Call from Rx), sodium chloride PRN        Objective:  Vital Signs: Last Filed                 Vital Signs: 24 Hour Range   BP: 135/60 (03/26 0359)  Temp: 37.1 ?C (98.8 ?F) (03/26 0359)  Pulse: 96 (03/26 0359)  Respirations: 15 PER MINUTE (03/26 0359)  SpO2: 94 % (03/26 0359)  O2 Device: None (Room air) (03/26 0359) BP: (103-135)/(60)   Temp:  [36.1 ?C (97 ?F)-37.1 ?C (98.8 ?F)]   Pulse:  [83-96]   Respirations:  [15 PER MINUTE-16 PER MINUTE]   SpO2:  [94 %-96 %]   O2 Device: None (Room air)   Intensity Pain Scale (Self Report): 9 (12/23/23 1031) Vitals:    12/08/23 1445 12/09/23 1715 12/23/23 0752   Weight: 101.5 kg (223 lb 12.8 oz) 101.5 kg (223 lb 12.8 oz) 101.5 kg (223 lb 12.3 oz)       Intake/Output Summary:  (Last 24 hours)    Intake/Output Summary (Last 24 hours) at 12/24/2023 0756  Last data filed at 12/24/2023 0359  Gross per 24 hour   Intake 987 ml   Output 325 ml   Net 662 ml           Physical Exam  Constitutional:       General: He is not in acute distress.     Appearance: Normal appearance.   HENT:      Head: Normocephalic and atraumatic.      Nose: Nose normal.   Eyes:      Conjunctiva/sclera: Conjunctivae normal.   Cardiovascular:      Rate and Rhythm: Normal rate and regular rhythm.      Heart sounds: Normal heart sounds. No murmur heard.  Pulmonary:      Effort: Pulmonary effort is normal. No respiratory distress.      Breath sounds: Normal breath sounds.   Skin:     General: Skin is warm and dry.   Neurological:      Mental Status: He is alert. He is disoriented.      Comments: Remains encephalopathic              Point of Care Testing  (Last 24 hours)  Glucose: (!) 62 (12/24/23 0417)  POC Glucose (Download): 90 (12/24/23 0618)    Radiology and other Diagnostics Review:    CT NECK W/CONTRAST  Result Date: 12/08/2023  1.  Marked decrease size of the prior large left oropharyngeal mass. There is a residual 1.7 x 1.5 cm lesion involving the left tonsil with central fluid attenuation. Treated necrotic tumor is the leading consideration. A post therapeutic retention cyst could appear similar. An abscess is thought less likely though direct visualization and clinical correlation is recommended regarding symptoms of infection. 2.  Improvement of bilateral cervical lymphadenopathy. By my electronic signature, I attest that I have personally reviewed the images for this examination and formulated the interpretations and opinions expressed in this report  Finalized by Marily Memos, M.D. on 12/08/2023 12:00 PM. Dictated by Jerene Pitch, MD on 12/08/2023 11:07 AM.    CT HEAD WO CONTRAST  Result Date: 12/08/2023  1.  No acute intracranial hemorrhage or mass effect. 2.  Mild patchy cerebral white matter hypodensities, likely sequelae of chronic microvascular ischemia. 3.  The CT neck is dictated separately. Please see that report. By my electronic signature, I attest that I have personally reviewed the images for this examination and formulated the interpretations and opinions expressed in this report  Finalized by Marily Memos, M.D. on  12/08/2023 12:00 PM. Dictated by Jerene Pitch, MD on 12/08/2023 10:51 AM.

## 2023-12-24 NOTE — Progress Notes
 Neurology Progress Note      Daryl Baker  Admission Date: 12/08/2023  LOS: 16 days  Date of service: 12/24/23      Assessment/Plan:  Principal Problem:    Oropharyngeal mass  Active Problems:    Oropharyngeal cancer (CMS-HCC)    Severe malnutrition    Mouth pain    Drug-induced constipation    Encephalopathy acute    Investigations     MRI Brain (with and without contrast, [date]):  Scattered small acute to early subacute bilateral cerebral white matter infarcts, predominantly in the bilateral corona radiata, centrum semiovale, and periatrial regions.  Additional small subcortical infarct at the junction of the medial right precentral gyrus and superior frontal gyrus  Stable mild generalized cerebral volume loss with prominence of ventricles and extra-axial spaces.    EEG : Mild encephalopathy, no epileptiform activity.    CT Neck (12/08/23): Marked decrease in size of left oropharyngeal mass; residual 1.7 x 1.5 cm lesion in left tonsil, likely treated necrotic tumor.    CT Head (12/08/23): No acute intracranial hemorrhage or mass effect; mild patchy white matter hypodensities consistent with chronic microvascular ischemia.     Carotid doppler - unremarkable     Impression     Daryl Baker is a 68 year old male with a history of oropharyngeal cancer, hypertension, and diabetes, presenting with AMS and MRI evidence of multiple punctate bilateral hemisphere strokes. 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 The bilateral distribution suggests a possible cardiac or proximal arterial embolic source. His neurological condition is multifactorial:    Acute and Subacute Ischemic Strokes:  MRI shows multiple small bilateral white matter infarcts and a right frontal subcortical infarct, consistent with watershed or border zone strokes.  Potential mechanisms include hypoperfusion (e.g., cardiac dysfunction, hypotension) or embolic phenomena (cardiac or arterial source).    Encephalopathy:  Likely multifactorial, including medication effects (opioids: fentanyl, oxycodone, hydromorphone; gabapentin) and delirium exacerbated by pain, sleep deprivation, or hospitalization.  Prior cisplatin use is noted, but MRI does not support posterior reversible encephalopathy syndrome (PRES).    Mental Status:  Significantly improved since the last assessment, though he remains disoriented to time and situation and occasionally makes bizarre statements.    Plan     TEE - due to history of cancer. Stroke likely from watersheds/border zone infarcts and less likely embolic or combination of both      telemetry monitoring to assess for atrial fibrillation or other arrhythmias.    Obtain lipid panel and HbA1c to optimize vascular risk factors.    Start aspirin 81 mg daily for secondary stroke prevention, pending further workup.    Optimize BP control (goal <130/80 mmHg) and resume metformin/glyburide post-MRI if appropriate.        Continue delirium precautions (e.g., reorientation, quiet environment).  Optimize sleep with trazodone 50 mg qhs and melatonin 10 mg qhs; consider further titration if ineffective.    Minimize opioids and sedatives as pain stabilizes; explore non-opioid alternatives with palliative care.      Neurology Consult 3 service  Please send Voalte messages regarding patient issues to Neurology Consults 3 [phone] First Call between the hours of 1200-2200. For any urgent questions or concerns outside of those hours, please contact pager [phone].      Ignacia Marvel MD   Assistant Professor  Department of Neurology      ________________________________________________________________________    Subjective:  mental status waxing waning -  overall improving.     Objective:                    Vital Signs:  Last Filed                   Vital Signs: 24 Hour Range   BP: 134/60 (03/25 2032)  Temp: 37.1 ?C (98.7 ?F) (03/25 2032)  Pulse: 89 (03/25 2032)  Respirations: 16 PER MINUTE (03/25 2032)  SpO2: 96 % (03/25 2032)  O2 Device: None (Room air) (03/25 2032)  Height: 179.1 cm (5' 10.51) (03/25 0752)  BP: (91-134)/(52-60)   Temp:  [36.1 ?C (97 ?F)-37.1 ?C (98.7 ?F)]   Pulse:  [78-101]   Respirations:  [16 PER MINUTE]   SpO2:  [93 %-96 %]   O2 Device: None (Room air)    Intensity Pain Scale (Self Report): (not recorded)    Intake/Output Summary: (Last 24 hours)    Intake/Output Summary (Last 24 hours) at 12/24/2023 0047  Last data filed at 12/23/2023 1735  Gross per 24 hour   Intake 987 ml   Output --   Net 987 ml           General: alert, oriented x 2  Speech: normal, no aphasia, no dysarthria   Cranial nerves: II Visual fields full to finger counting; III, IV, VI PERRL, extraocular muscles intact, no nystagmus; V facial sensation intact; VII facial expression symmetric; VIII hearing intact to conversation; IX, X palate rise symmetric; XI sternocleidomastoid strength intact; XII tongue midline, no fasciculations present  Motor: (Right/Left) No drift   Sensory: Normal to light touch.   Coordination: normal finger nose finger  Abnormal Movements: no tremors, no rigidity, no bradykinesia  Gait: deferred       Scheduled Meds:aspirin chewable tablet 81 mg, 81 mg, PEG Tube, QDAY  [Held by Provider] Diet Enteral Feeding Bolus, , SEE ADMIN INSTRUCTIONS, 5XDAY (6,10,14,18,22   And  Water Bolus, 100 mL, PEG Tube, 5XDAY (6,10,14,18,22  enoxaparin (LOVENOX) syringe 40 mg, 40 mg, Subcutaneous, QDAY(21)  fentaNYL (DURAGESIC) 50 mcg/hr patch 1 patch, 1 patch, Transdermal, Q72H*   And  Verification of Patch Placement and Integrity - fentaNYL 50 mcg/hr, , Transdermal, BID  fluconazole (DIFLUCAN) oral suspension 200 mg, 200 mg, Oral, QDAY  gabapentin (NEURONTIN) capsule 300 mg, 300 mg, Per G Tube, TID  [Held by Provider] glyBURIDE (DIABETA) tablet 2.5 mg, 2.5 mg, Oral, QDAY  melatonin tablet 10 mg, 10 mg, Oral, QHS  metFORMIN (GLUCOPHAGE) tablet 500 mg, 500 mg, Oral, BID w/meals  polyethylene glycol 3350 (MIRALAX) packet 17 g, 1 packet, Per G Tube, QDAY  sennosides-docusate sodium (SENOKOT-S) tablet 2 tablet, 2 tablet, Per G Tube, QDAY  sodium chloride 0.9% bacteriostatic injection 20 mL, 20 mL, Injection, ONCE  thiamine hcl (vitamin B1) injection 500 mg, 500 mg, Intravenous, Q8H  traZODone (DESYREL) tablet 75 mg, 75 mg, Oral, QHS  triamcinolone acetonide (KENALOG) 0.1 % topical cream 1 g, 1 g, Topical, BID    Continuous Infusions:  PRN and Respiratory Meds:acetaminophen Q6H PRN, bisacodyL QDAY PRN, dextrose 50% (D50) IV PRN, diphenhydrAMINE/lidocaine/mag,al-simeth(#) TID PRN, HYDROmorphone (DILAUDID) injection Q2H PRN, ondansetron Q6H PRN **OR** ondansetron (ZOFRAN) IV Q6H PRN, oxyCODONE Q3H PRN, pancrelipase 20,880 Units/sodium bicarbonate 650 mg ([place] CLOG DESTROYER) PRN (On Call from Rx), sodium chloride PRN       Laboratory Review:     No results found for: PHART, PCO2A, PO2ART, HCO3A, BASEEXA, BASEDEFA, O2SATACAL  Lab Results   Component Value Date  HGB 8.2 (L) 12/23/2023    HCT 23.2 (L) 12/23/2023    PLTCT 441 (H) 12/23/2023    WBC 3.50 (L) 12/23/2023    NEUT 60.0 12/18/2023    ANC 2.9 12/23/2023    LYMPH 4 (L) 12/23/2023     Lab Results   Component Value Date    NA 134 (L) 12/23/2023    K 4.4 12/23/2023    CL 97 (L) 12/23/2023    CO2 29 12/23/2023    GAP 8 12/23/2023    BUN 16 12/23/2023    CR 0.62 12/23/2023    GLU 212 (H) 12/23/2023    CA 8.1 (L) 12/23/2023    MG 2.0 12/23/2023    PO4 3.9 12/15/2023    LACTIC 1.8 12/15/2023    TOTPROT 5.8 (L) 12/23/2023    AST 15 12/23/2023    ALT 10 12/23/2023    ALKPHOS 47 12/23/2023     No results found for: TNI, CKMB, MYOGLB  Lab Results   Component Value Date    INR 1.0 12/05/2023     Lab Results   Component Value Date    TSH 0.38 12/08/2023     Lab Results   Component Value Date    CHOL 68 12/22/2023    TRIG 98 12/22/2023    HDL 32 (L) 12/22/2023    LDL 32 12/22/2023    VLDL 45.4 12/22/2023     No results found for: CYCLOSPOR, TACROLIMUS, FREEPHENY     Point of Care Testing:  (Last 24 hours):  FSBS (Manual):  (refused BS, nurse notified)  Glucose: (!) 212  POC Glucose (Download): (!) 129    Radiology and Other Diagnostics Review: reviewed     Ignacia Marvel, MD

## 2023-12-24 NOTE — Unmapped
 Patient/family declined:  Other: Telemetry .    Patient/family educated on importance of intervention to their safety/quality of care. Patient/family continues to decline care.    Reason Why Patient/Family declined:  I don't really think I need that right now. I'll ask again tomorrow.    Individualized safety and/or care plan implemented. If additional safety measures implemented, please list them.     Escalated to:  Provider: Dr Avelino Leeds .

## 2023-12-25 ENCOUNTER — Inpatient Hospital Stay: Admit: 2023-12-25 | Discharge: 2023-12-25 | Payer: MEDICARE

## 2023-12-25 ENCOUNTER — Encounter: Admit: 2023-12-25 | Discharge: 2023-12-25 | Payer: MEDICARE

## 2023-12-25 LAB — COMPREHENSIVE METABOLIC PANEL
~~LOC~~ BKR ALBUMIN: 3.1 g/dL — ABNORMAL LOW (ref 3.5–5.0)
~~LOC~~ BKR ALK PHOSPHATASE: 66 U/L — ABNORMAL LOW (ref 25–110)
~~LOC~~ BKR ALT: 10 U/L — ABNORMAL LOW (ref 7–56)
~~LOC~~ BKR ANION GAP: 6 10*6/uL — ABNORMAL LOW (ref 3–12)
~~LOC~~ BKR AST: 14 U/L — ABNORMAL LOW (ref 7–40)
~~LOC~~ BKR CO2: 32 mmol/L — ABNORMAL HIGH (ref 21–30)
~~LOC~~ BKR GLOMERULAR FILTRATION RATE (GFR): >60 mL/min — ABNORMAL LOW (ref >60–16.5)
~~LOC~~ BKR TOTAL BILIRUBIN: 0.2 mg/dL — ABNORMAL LOW (ref 0.2–1.3)
~~LOC~~ BKR TOTAL PROTEIN: 5.7 g/dL — ABNORMAL LOW (ref 6.0–8.0)

## 2023-12-25 LAB — MANUAL DIFF
~~LOC~~ BKR BANDS - RELATIVE: 1 % (ref 0–10)
~~LOC~~ BKR BLASTS - RELATIVE: 2 %
~~LOC~~ BKR LYMPHOCYTES - RELATIVE: 7 % — ABNORMAL LOW (ref 24–44)
~~LOC~~ BKR METAMYELOCYTES - RELATIVE: 4 %
~~LOC~~ BKR MONOCYTES - RELATIVE: 15 % — ABNORMAL HIGH (ref 4–12)
~~LOC~~ BKR MYELOCYTES - RELATIVE: 3 %
~~LOC~~ BKR NEUT+BANDS - ABSOLUTE: 5.3 10*3/uL (ref 1.8–7.0)
~~LOC~~ BKR NEUTROPHILS - RELATIVE: 68 % (ref 41–77)
~~LOC~~ BKR NUCLEATED RBC MANUAL: 0 10*3/uL

## 2023-12-25 LAB — CBC AND DIFF
~~LOC~~ BKR MPV: 8.2 fL — ABNORMAL LOW (ref 7.0–11.0)
~~LOC~~ BKR PLATELET COUNT: 489 10*3/uL — ABNORMAL HIGH (ref 150–400)

## 2023-12-25 LAB — CULTURE-CSF W/SENSITIVITY

## 2023-12-25 LAB — POC GLUCOSE
~~LOC~~ BKR POC GLUCOSE: 129 mg/dL — ABNORMAL HIGH (ref 70–100)
~~LOC~~ BKR POC GLUCOSE: 184 mg/dL — ABNORMAL HIGH (ref 70–100)
~~LOC~~ BKR POC GLUCOSE: 168 mg/dL — ABNORMAL HIGH (ref 70–100)

## 2023-12-25 MED ORDER — SODIUM CHLORIDE 0.9 % IJ SOLN
50 mL | Freq: Once | INTRAVENOUS | 0 refills | Status: CP
Start: 2023-12-25 — End: ?
  Administered 2023-12-25: 21:00:00 50 mL via INTRAVENOUS

## 2023-12-25 MED ORDER — IOHEXOL 350 MG IODINE/ML IV SOLN
80 mL | Freq: Once | INTRAVENOUS | 0 refills | Status: CP
Start: 2023-12-25 — End: ?
  Administered 2023-12-25: 21:00:00 80 mL via INTRAVENOUS

## 2023-12-25 MED ORDER — PHENOL 1.4 % MM SPRA
2 | OROMUCOSAL | 0 refills | Status: DC | PRN
Start: 2023-12-25 — End: 2024-01-02

## 2023-12-25 MED ORDER — TUBE FEED BOLUS (ENAR)
Freq: Every day | 0 refills | Status: DC
Start: 2023-12-25 — End: 2024-01-02

## 2023-12-25 MED ORDER — WATER BOLUS (ENAR)
100 mL | Freq: Every day | GASTROSTOMY | 0 refills | Status: DC
Start: 2023-12-25 — End: 2024-01-02

## 2023-12-25 NOTE — Progress Notes
 General Progress Note    Name:  Daryl Baker     Today's Date:  12/25/2023  Admission Date: 12/08/2023  LOS: 17 days                     Assessment/Plan:    Principal Problem:    Oropharyngeal mass  Active Problems:    Oropharyngeal cancer (CMS-HCC)    Severe malnutrition    Mouth pain    Drug-induced constipation    Encephalopathy acute      68 y.o. 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 He has had increasing pain over the last few days prior to admission near his oropharyngeal mass on the left along with the left aspect of his tongue preventing any PO intake and has a scheduled G-tube placement.    Interval Events 12/25/2023:   -Per Cardiology: recommend Cardiac CT rather than TEE to avoid putting apparatus down the throat given oral SCC & large lesion from the radiation.  -Appropriate for IPR per OT, PT, PMR    Multiple Brain Infarcts  Acute metabolic encephalopathy  -Differential Dx: emboli vs watershed infarcts  -Blood sugar  = normal/ elevated  -Na = wnl, Ca = 8.3  -3/17 venous pH = 7.42  -TSH = 0.39  -Hx of cisplatin 5 cycles, which can cause encephalopathy/PRES  -currently denying headache, has been stooling regularly  -Had a course of thiamine during admission  -EEG: mild encephalopathy  -MRI attempted x2 with ativan but patient was unable to tolerate without anesthesia  -MRI brain: Scattered small acute to subacute bilateral cerebral white matter Infarcts concerning for emboli.  -Lumbar Puncture: elevated protein, cultures NGTD, cytology pending  -Lipid Profile: wnl;  last HbA1c = 7.6 (12/16/23)  -HSV & crypto neg  -TTE: valves not visualized, TEE: ordered  -carotid ultrasound: normal  -HSV & crypto neg  Plan:  > Cardiac CT to investigate atrial appendage/ look for vegetation  > high dose thiamine PO  > Scheduled melatonin & trazodone increased to 50mg , continue delirium precautions  > Neurology consulted- start ASA 81, optimize BP & diabetes, not consistent with PRES despite cisplatin, likely watershed infarct    Neutropenic fever  Presumed Aspiration pneumonia  Sepsis- resolved  -tachycardia, fever, increase in WBC (leukopenic at baseline)  -source likely pulmonary given imaging findings  -UA unremarkable  -CTA Chest: Development of a few clustered nodular and patchy opacities within the   right greater than left lower lobes, probably infectious or aspiration-related.   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  Plan:  > Trulicity nonformulary, will hold inpatient  > glyburide 2.5 mg daily & metformin 500 BID for hyperglycemia via G-tube (held 3/23 at midnight for MRI & LP under anesthesia)    Dysphagia   Oropharyngeal pain  -Due to left oropharyngeal cancer  -G-tube placed 3/12 AM  -He will need the G-tube feeds for >90 days.  Plan:  >Magic mouthwash TID PRN for oropharyngeal pain  > Consulted Dietician  >Transitioned to tube feeds 3/15, formal recs in dietician note  >  Palliative Care consult for pain management as above.   -Fentanyl patch 46mcg/hr  -Oxycodone 15mg  q4h PRN  -Dilaudid 0.5-1mg  IV q2h PRN  -Gabapentin 300mg  TID  -Bowel regimen with senokot-s 2ts BID, miralax BID  >Fluconazole 400mg  qday for oral candidiasis     Left HPV-Mediated Oropharyngeal SCC  -Currently on chemo and radiation  -Follow in Oncology clinic with dr Neita Carp and radiation Oncology Dr Mariane Masters  -CT Neck revealed:  1.  Marked decrease size of the prior large left oropharyngeal mass. There   is a residual 1.7 x 1.5 cm lesion involving the left tonsil with central   fluid attenuation. Treated necrotic tumor is the leading consideration. A   post therapeutic retention cyst could appear similar. An abscess is   thought less likely though direct visualization and clinical correlation   is recommended regarding symptoms of infection.   2.  Improvement of bilateral cervical lymphadenopathy.   -Radiation Oncology finished radiation while inpatient.(3/17-3/19)  Plan:  > Triamcinolone cream for radiation induced dermatitis    Pancytopenia   Neutropenia  -ANC <1  -likely due to Chemo & radiation  -follow with daily CBC w/ Diff     Hypertension  >Continue to hold metoprolol & losartan    FEN:  Bolus tube feeds with free water flushes/Replace electrolytes as needed/Tube feeds  DIET NPO AT MIDNIGHT Sips With Medications w/ Body mass index is 31.64 kg/m?Marland Kitchen   DVT PPX:  40mg  qday enoxaparin, SCDs bilat  Code status: Full Code     Patient seen and case discussed with Dr. Franco Nones, MD   Internal Medicine-Psychiatry Resident, PGY1  Available on Voalte   ________________________________________________________________________    Subjective  Patient seen and examined this morning at bedside. He is able to converse appropriately and is A&Ox4. He continues to make some impulsive decisions. His wife is at bedside.     Medications  Scheduled Meds:aspirin chewable tablet 81 mg, 81 mg, PEG Tube, QDAY  [Held by Provider] Diet Enteral Feeding Bolus, , SEE ADMIN INSTRUCTIONS, 5XDAY (6,10,14,18,22   And  Water Bolus, 100 mL, PEG Tube, 5XDAY (6,10,14,18,22  enoxaparin (LOVENOX) syringe 40 mg, 40 mg, Subcutaneous, QDAY(21)  fentaNYL (DURAGESIC) 50 mcg/hr patch 1 patch, 1 patch, Transdermal, Q72H*   And  Verification of Patch Placement and Integrity - fentaNYL 50 mcg/hr, , Transdermal, BID  fluconazole (DIFLUCAN) oral suspension 200 mg, 200 mg, Oral, QDAY  gabapentin (NEURONTIN) capsule 300 mg, 300 mg, Per G Tube, TID  [Held by Provider] glyBURIDE (DIABETA) tablet 2.5 mg, 2.5 mg, Oral, QDAY  melatonin tablet 10 mg, 10 mg, Oral, QHS  metFORMIN (GLUCOPHAGE) tablet 500 mg, 500 mg, Oral, BID w/meals  polyethylene glycol 3350 (MIRALAX) packet 17 g, 1 packet, Per G Tube, QDAY  sennosides-docusate sodium (SENOKOT-S) tablet 2 tablet, 2 tablet, Per G Tube, QDAY  [START ON 12/26/2023] thiamine mononitrate (vit B1) tablet 100 mg, 100 mg, PEG Tube, QDAY  traZODone (DESYREL) tablet 75 mg, 75 mg, Oral, QHS  triamcinolone acetonide (KENALOG) 0.1 % topical cream 1 g, 1 g, Topical, BID    Continuous Infusions:      PRN and Respiratory Meds:acetaminophen Q6H PRN, bisacodyL QDAY PRN, dextrose 50% (D50) IV PRN, diphenhydrAMINE/lidocaine/mag,al-simeth(#) TID PRN, ondansetron Q6H PRN **OR** ondansetron (ZOFRAN) IV Q6H PRN, oxyCODONE Q3H PRN, pancrelipase 20,880 Units/sodium bicarbonate 650 mg ([place] CLOG DESTROYER) PRN (On Call from Rx), sodium chloride PRN        Objective:  Vital Signs: Last Filed                 Vital Signs: 24 Hour Range   BP: 146/82 (03/27 0304)  Temp: 36.2 ?C (97.2 ?F) (03/27 0304)  Pulse: 83 (03/27 0304)  Respirations: 18 PER MINUTE (03/27 0304)  SpO2: 95 % (03/27 0304)  O2 Device: None (Room air) (03/27 0304) BP: (108-146)/(58-82)   Temp:  [36.2 ?C (97.2 ?F)-36.9 ?C (98.5 ?F)]   Pulse:  [83-110]   Respirations:  [16 PER MINUTE-18 PER MINUTE]   SpO2:  [94 %-97 %]   O2 Device: None (Room air)   Intensity Pain Scale (Self Report): 9 (12/24/23 2031) Vitals:    12/08/23 1445 12/09/23 1715 12/23/23 0752   Weight: 101.5 kg (223 lb 12.8 oz) 101.5 kg (223 lb 12.8 oz) 101.5 kg (223 lb 12.3 oz)       Intake/Output Summary:  (Last 24 hours)    Intake/Output Summary (Last 24 hours) at 12/25/2023 0730  Last data filed at 12/25/2023 4696  Gross per 24 hour   Intake 995 ml   Output 1650 ml   Net -655 ml           Physical Exam  Constitutional:       General: He is not in acute distress.     Appearance: Normal appearance.   HENT:      Head: Normocephalic and atraumatic.      Nose: Nose normal.   Eyes:      Conjunctiva/sclera: Conjunctivae normal.   Cardiovascular:      Rate and Rhythm: Normal rate and regular rhythm.      Heart sounds: Normal heart sounds. No murmur heard.  Pulmonary:      Effort: Pulmonary effort is normal. No respiratory distress.      Breath sounds: Normal breath sounds.   Skin:     General: Skin is warm and dry.   Neurological:      Mental Status: He is alert. He is disoriented.      Comments: Remains encephalopathic              Point of Care Testing  (Last 24 hours)  Glucose: (!) 169 (12/25/23 0307)  POC Glucose (Download): (!) 154 (12/24/23 1200)    Radiology and other Diagnostics Review:    CT NECK W/CONTRAST  Result Date: 12/08/2023  1.  Marked decrease size of the prior large left oropharyngeal mass. There is a residual 1.7 x 1.5 cm lesion involving the left tonsil with central fluid attenuation. Treated necrotic tumor is the leading consideration. A post therapeutic retention cyst could appear similar. An abscess is thought less likely though direct visualization and clinical correlation is recommended regarding symptoms of infection. 2.  Improvement of bilateral cervical lymphadenopathy. By my electronic signature, I attest that I have personally reviewed the images for this examination and formulated the interpretations and opinions expressed in this report  Finalized by Marily Memos, M.D. on 12/08/2023 12:00 PM. Dictated by Jerene Pitch, MD on 12/08/2023 11:07 AM.    CT HEAD WO CONTRAST  Result Date: 12/08/2023  1.  No acute intracranial hemorrhage or mass effect. 2.  Mild patchy cerebral white matter hypodensities, likely sequelae of chronic microvascular ischemia. 3.  The CT neck is dictated separately. Please see that report. By my electronic signature, I attest that I have personally reviewed the images for this examination and formulated the interpretations and opinions expressed in this report  Finalized by Marily Memos, M.D.  on 12/08/2023 12:00 PM. Dictated by Jerene Pitch, MD on 12/08/2023 10:51 AM.

## 2023-12-25 NOTE — Progress Notes
 OCCUPATIONAL THERAPY  PROGRESS NOTE      Name: Daryl Baker   MRN: [id number]     DOB: Jan 18, 1956      Age: 68 y.o.  Admission Date: 12/08/2023     LOS: 17 days     Date of Service: 12/25/2023      Mobility  Patient Turn/Position: Chair  Mobility Level [facility] Highest Level of Mobility (JH-HLM): Walk 250 feet or more  Distance Walked (feet): 350 ft  Level of Assistance: Assist X1  Assistive Device:  [id number])  Activity Limited By: Weakness;Fatigue    Subjective  Significant Hospital Events: Hx of hypertension, diabetes type 2, left-sided HPV mediated oropharyngeal cancer undergoing radiation therapy who presented to [facility] on 12/08/2023 for dysphagia, oropharyngeal pain and inability to tolerate p.o. intake.  Mental / Cognitive: Alert;Oriented;Cooperative;Follows commands  Pain: No complaint of pain  Persons Present: Spouse;Sister    Home Living Situation  Lives With: Spouse/significant other (Wife)  Type of Home: House  Entry Stairs: No stairs;Ramp  In-Home Stairs: Able to live on main level  Patient Owned Equipment: Walker: Rollator  Comments: Patient lives with spouse and special needs child. Patient lives on main level, spouse and child live upstairs. Patients spouse with recent shoulder surgery and providing cares for child, so generally unable to provide assistance for patient. Can provide intermittent supervision. Spouse concerned about increase in patients confusion and ability to take care of himself, as he has largely been in bed since admission. She reports patient has called her in the middle of the night asked her to pick him up because he feels like he is being held hostage.    Prior Level of Function  Level Of Independence: Independent with ADL and community mobility with device  History of Falls in Past 3 Months: No    ADL's  Where Assessed: Chair;In Bathroom;Standing at [employer]  Eating Assist:  (PEG)  Grooming Assist: Minimal Assist (CGA)  Grooming Deficits: Steadying;Wash/Dry Hands  LE Dressing Assist: Stand By Assist  LE Dressing Deficits: Setup;Don/Doff R Shoe;Don/Doff L Shoe (Requires assist for retrieval of shoes.)  Toileting Assist: Minimal Assist  Toileting Deficits: Steadying;Supervision/Safety;Grab Bar Use;Clothing Management Up;Clothing Management Down  Comment: Pt fully dressed in street clothes upon therapists arrival.    ADL Mobility  Bed Mobility Comments: Pt in bedside chair upon therapists arrival/departure  Transfer Type: Sit to/from stand  Transfer: Assistance Level: To/from;Bedside chair;Toilet;Minimal assist  Transfer: Assistive Device: 4-wheeled walker  Transfer: Type of Assistance: For balance;For safety considerations  Other Transfer Type: Stand pivot  Other Transfer: Assistance Level: To/from;Bedside chair;Toilet;Minimal assist  Other Transfer: Assistive Device: 4-wheeled walker  Other Transfer: Type of Assistance: For balance;For safety considerations;Verbal cues  End of Activity Status: Up in chair;Instructed patient to request assist with mobility;Instructed patient to use call light;Nursing notified (Chair alarm activated)  Gait Distance: 350 feet  Gait: Assistance Level: Minimal assist  Gait: Assistive Device: 4-wheeled walker  Gait Comments: Steadying assist and intermittent cues for safety    Activity Tolerance  Endurance: 2/5 Tolerates 10-20 Minutes Exercise w/Multiple Rests    Cognition  Overall Cognitive Status: WFL to Adequately Complete Self Care Tasks Safely  Social Interaction: Interacts in a Spontaneous,Cooperative Manner  Problem Solving:  (Mildly impulsive, though likely pt's baseline)  Orientation: Alert & Oriented x4  Cognition Comment: Conversation WFL this date    Education  Persons Educated: Patient/Family  Teaching Methods: Verbal Instruction  Patient Response: Verbalized and Demo Understanding  Topics: Role of OT, Goals for Therapy  Goal Formulation: With Patient    Assessment  Assessment: Decreased ADL Status;Decreased Safe/Judg during ADL;Decreased Cognition;Decreased Endurance;Decreased Self-Care Trans;Decreased High-Level ADLs  Prognosis: Good;w/Cont OT s/p Acute Discharge  Goal Formulation: Patient  Comments: Pt is limited by  continued confusion, impaired balance, and generalized weakness. He would benefit from continued therapy in an inpatient setting as pt must be independent to return home. Pending acute length of stay, may progress for home discharge. Could consider OT KELS evaluation closer to expected dc to evaluate cognitive needs    AM-PAC 6 Clicks Daily Activity Inpatient  Putting on and taking off regular lower body clothes: A Little  Bathing (Including washing, rinsing, drying): A Little  Toileting, which includes using toilet, bedpan, or urinal: A Little  Putting on and taking off regular upper body clothing: None  Taking care of personal grooming such as brushing teeth: None  Eating meals: None  Daily Activity Raw Score: 21  Standardized (T-scale) Score: 44.27    Plan  Progress: Progressing Toward Goals  OT Frequency: 5x/week  OT Plan for Next Visit: Dynamic balance during functional tasks, grooming at sink, progress endurance    ADL Goals  Patient Will Perform All ADL's: w/ Modified Independence    Functional Transfer Goals  Pt Will Perform All Functional Transfers: Modified Independent    OT Discharge Recommendations  Recommendation: Inpatient setting;Recommend rehab medicine consult      Therapist: Michaelle Copas, OTR/L [id number]  Date: 12/25/2023

## 2023-12-25 NOTE — Unmapped
 Patient/family declined:  Other: Telemetry .    Patient/family educated on importance of intervention to their safety/quality of care. Patient/family continues to decline care.    Reason Why Patient/Family declined:  Pt wishes to not wear telemetry. States it is bothersome and pulls it off if replaced    Individualized safety and/or care plan implemented. If additional safety measures implemented, please list them.     Escalated to:  Unit coordinator/charge nurse.

## 2023-12-25 NOTE — Case Management (ED)
 0945: Per chart review, VSS with recent consecutive elevated B/P. PEG tube - clamped for TEE today. Neuro work up ongoing. Per SW, patient could possibly be medically stable for discharge tomorrow. Admissions team will submit for insurance authorization for hopeful admit to [facility].     1000: Notified Thea Silversmith (SW) re: chart review findings. Admissions liaison to follow up per updates / auth determination.     Dorann Ou, BSN, RN  [facility] Admission Nurse (office: [phone]) or (voalte).

## 2023-12-25 NOTE — Progress Notes
 Patient's Daryl Baker/CV: Transesophageal Echocardiogram canceled today per Dr. Danella Deis with ANES/CARD: Cardiology.     Dr. Danella Deis would like to talk to attending doctors for CT exam prior Daryl Baker.

## 2023-12-25 NOTE — Progress Notes
 CLINICAL NUTRITION                                                        Clinical Nutrition Follow-Up Assessment     Name: Daryl Baker   MRN: [id number]     DOB: September 23, 1956      Age: 68 y.o.  Admission Date: 12/08/2023     LOS: 17 days     Date of Service: 12/25/2023        Recommendation:  Least restrictive PO diet per SLP/primary team recs.     When able to resume EN, continue bolus feeds of TwoCal HN, total of 5.5 cartons daily.   1 carton (237 mL) x 4 feeds per day + 1.5 cartons (~356 mL) once daily.  This provides 2613 kcal/day, 109 g protein/day, and 913 mL free water/day.   At least 30mL free water flush before and after each bolus feed. Additional fluids per primary team recs.     Comments:  Clinical nutrition following for EN management. EN resumed evening of 3/24, received 2 feeds. On 3/25, pt received 4 feeds. EN held 3/26 (resumed in the afternoon, received 3 feeds); held again today for TEE. Amt of formula administered at each feed not documented consistently in [facility], so difficult to calculate EN avg. Per MD note today, TEE cancelled and now plans for cardiac CT. Noted that pt finished radiation tx while inpatient. Spoke to RN prior to pt visit- no bolus feeds given yet today d/t planned procedure; did not receive report from other staff about TF. Visited pt, denied issues/concerns with TF. Denied n/v, c/d. +BM 3/26 per EMR. RD will continue to follow.     Meds- reviewed; liquid tylenol, metformin, glyburide, oxycodone, miralax (held/refused), senna (held/refused), thiamine. Labs- 136 Na, 88-267 glucose POC.                            Nutrition Assessment of Patient:  Admit Weight: 102 kg; Weight Change Since Admit: +8# (2+ pitting edema noted, +12L per I/Os since 3/13); Desired Weight: 79.9 kg  BMI (Calculated): 32.36;    Pertinent Allergies/Intolerances: none per EMR      Oral Diet Order: NPO at midnight;    Current EN Order: Bolus feeds- TwoCal HN, 5.5 cartons daily  Current Energy Intake: NPO  Weight Used for Calculation: 79.9 kg  Estimated Calorie Needs: 2395-2795 kcal (30-35 kcal/kg DBW)  Estimated Protein Needs: 104-120 g protein (1.3-1.5 g protein/kg DBW)    Malnutrition Assessment:  Malnutrition present on admission  ICD-10 code E43: Chronic illness/Severe malnutrition        Energy intake: 75% or less of estimated energy requirement for 1 month or more, Weight loss: Greater than 20% x 1 year          Malnutrition Interventions: Monitor TF adequacy, tolerance    Nutrition Focused Physical Exam:  Loss of Subcutaneous Fat: Yes; Severity: Moderate; Location: Triceps  Muscle Wasting: Yes; Severity: Mild; Location: [place], Deltoid    Physical Assessment:  RLE Edema: Pitting 2+  LLE Edema: Pitting 2+  Pedal Edema: Pitting 2+     Pressure Injury: none noted     Comment: +BM 3/26 per EMR (x2, loose)    Nutrition Diagnosis:  Inadequate oral intake  Etiology: mouth pain, PO  intake intolerance, poor appetite  Signs & Symptoms: pt report per chart review, wife report, need for G-tube placement and EN recs    Intervention / Plan:  Monitor wt trends, labs, I/Os, PO intake, GI/BMs      Kimmerly Lora, MCN, RDN, LD  Available on Voalte

## 2023-12-25 NOTE — Case Management (ED)
 Case Management Progress Note    NAME:Daryl Baker                          MRN: [id number]              DOB:1956/04/28          AGE: 68 y.o.  ADMISSION DATE: 12/08/2023             DAYS ADMITTED: LOS: 17 days      Today's Date: 12/25/2023    PLAN: Discharge to [facility] pending medical stability and insurance auth.     Expected Discharge Date: 01/02/2024   Is Patient Medically Stable: No, Please explain: TEE  Are there Barriers to Discharge? no    INTERVENTION/DISPOSITION:  Discharge Planning              Discharge Planning: Inpatient Rehabilitation    SW attended team huddle and reviewed EMR. Pt getting TEE today; if okay could be nearing dc, per team.    SW met with pt at bedside. He confirms preference for dc to [facility] when medically stable.     SW updated [facility]. They have reviewed and will start insurance auth.    SW to continue to follow.    Transportation              Does the Patient Need Case Management to Arrange Discharge Transport? (ex: facility, ambulance, wheelchair/stretcher, Medicaid, cab, other): No  Transportation Name, Phone and Availability #1: drevin, ortner (Spouse)  [phone] (Mobile)  Support              Support: Pt/Family Updates re:POC or DC Plan, Huddle/team update  Info or Referral              Information or Referral to Community Resources: No Needs Identified  Positive SDOH Domains and Potential Barriers   Positive for SDOH Domain:  (No Needs Identified)               Medication Needs              Medication Needs: No Needs Identified                                                                                                                                          Financial              Financial: No Needs Identified  Legal              Legal: No Needs Identified  Other              Other/None: No needs identified  Discharge Disposition Selected Continued Care - Admitted Since 12/08/2023       [facility] Coordination complete.      Service Provider Services Address Phone Fax Patient Preferred    [facility]  [facility] [address], [place] [address] [phone] --              [facility] Coordination complete.      Service Provider Services Address Phone Fax Patient Preferred    [facility] [address], [place] [address] [phone] --                      Esau Grew    [facility]

## 2023-12-25 NOTE — Progress Notes
 Pre-Operative Assessment for Daryl Baker or Cardioversion    Date of Service:  12/25/2023    Daryl Baker is a 68 y.o. y.o. male who is referred for Daryl Baker Indication: Embolic Evvent .      he has been compliant with his  aspirin 81mg  and Lovenox  Missed dose: No and denies bleeding. he is Positive for: Swallowing difficulty, Loose/False teeth, and Hx. Head/Neck surgery/radiation and Positive for: None.      GI procedures: No             When: No    Chest pain:  No   SOB: No         Medical History:  Past Medical History:    DM (diabetes mellitus) (CMS-HCC)    Hyperlipidemia    Hypertension    Neurogenic amyotrophy    Neuropathy        Surgical History:   Surgical History:   Procedure Laterality Date    ABDOMINAL HERNIA REPAIR      KNEE ARTHROSCOPY Left        Social History     Social History     Tobacco Use    Smoking status: Former     Current packs/day: 1.00     Types: Cigarettes     Passive exposure: Past    Smokeless tobacco: Never   Vaping Use    Vaping status: Never Used   Substance Use Topics    Alcohol use: Yes     Alcohol/week: 1.0 standard drink of alcohol     Types: 1 Shots of liquor per week    Drug use: Never         Allergies                                        No Known Allergies       Current Medications  No current facility-administered medications on file prior to encounter.     Current Outpatient Medications on File Prior to Encounter   Medication Sig Dispense Refill    dexAMETHasone (DECADRON) 4 mg tablet Beginning 24 hours after each Cisplatin infusion, take 2 tablets by mouth daily for 3 days. 36 tablet 0    FREESTYLE LIBRE 2 READER reader Use one each as directed every 6 hours.      lidocaine hcl viscous (LIDOCAINE VISCOUS) 2 % solution Swish and Spit 5 mL by mouth as directed four times daily as needed. 100 mL 3    losartan (COZAAR) 50 mg tablet Take two tablets by mouth daily.      metFORMIN-XR (GLUCOPHAGE XR) 750 mg extended release tablet Take one tablet by mouth daily with dinner. metoprolol succinate XL (TOPROL XL) 100 mg extended release tablet Take one tablet by mouth daily.      morphine SR (MS CONTIN) 15 mg tablet Take one tablet by mouth every 12 hours.      morphine SR (MS CONTIN) 30 mg ER tablet Take one tablet by mouth every 12 hours for 30 days. Indications: severe chronic pain requiring long-term opioid treatment  Dose adjustment requiring early fill 60 tablet 0    OLANZapine (ZYPREXA) 5 mg tablet Take 1 tablet by mouth daily at bedtime for 4 days starting on Cisplatin infusion day.  Indications: prevent nausea and vomiting from cancer chemotherapy 24 tablet 0    ondansetron HCL (ZOFRAN) 8 mg tablet Take one tablet by  mouth every 8 hours as needed (nausea and vomiting). Indications: prevent nausea and vomiting from cancer chemotherapy 90 tablet 0    oxyCODONE (ROXICODONE) 5 mg tablet Take one tablet to two tablets by mouth every 4-6 hours as needed for Pain. Indications: pain 120 tablet 0    triamcinolone acetonide (TRIDERM) 0.1 % topical cream Apply one g topically to affected area twice daily. 80 g 3    TRULICITY 3 mg/0.5 mL injection pen Inject 0.5 mL under the skin every 7 days. Sunday         Vitals  Estimated body mass index is 32.36 kg/m? as calculated from the following:    Height as of this encounter: 180.3 cm (5' 11).    Weight as of this encounter: 105.2 kg (232 lb).       Patient appears alert and oriented: Yes  NPO: for greater than 8 hours  Inpatient IV status:  Port    Diagnostic Tests  White Blood Cells   Date Value Ref Range Status   12/25/2023 7.70 4.50 - 11.00 10*3/uL Final     Hemoglobin   Date Value Ref Range Status   12/25/2023 8.1 (L) 13.5 - 16.5 g/dL Final     Hematocrit   Date Value Ref Range Status   12/25/2023 24.1 (L) 40.0 - 50.0 % Final     Platelet Count   Date Value Ref Range Status   12/25/2023 489 (H) 150 - 400 10*3/uL Final     Sodium   Date Value Ref Range Status   12/25/2023 136 (L) 137 - 147 mmol/L Final     Potassium   Date Value Ref Range Status   12/25/2023 4.5 3.5 - 5.1 mmol/L Final     Magnesium   Date Value Ref Range Status   12/25/2023 2.0 1.6 - 2.6 mg/dL Final     Blood Urea Nitrogen   Date Value Ref Range Status   12/25/2023 17 7 - 25 mg/dL Final     Creatinine   Date Value Ref Range Status   12/25/2023 0.55 0.40 - 1.24 mg/dL Final     Glucose   Date Value Ref Range Status   12/25/2023 169 (H) 70 - 100 mg/dL Final       Last MAC INR Flow Sheet Entry:    Last recorded Lab results:   INR   Date Value Ref Range Status   12/05/2023 1.0 0.9 - 1.2 Final     No results found for: PTT        Blood Cultures     Microbiology - Resulted Micro Last 72 Hrs        CULTURE-CSF W/SENSITIVITY  Resulted: 12/25/23 0636, Result status: Final result     Ordering provider: Alver Fisher, Daryl Baker  12/22/23 1434 Resulting lab: [facility]   CLIA number: [id number]      Specimen Information      Source Collected On   Lumbar Puncture 12/22/23 1526              Components      Component Value Flag   Csf Culture No growth at 3 days  --   Gram Stain No Neutrophils Seen  --   Gram Stain No Organisms seen  --                  CULTURE-FUNGAL,CSF  Resulted: 12/23/23 1901, Result status: Preliminary result     Ordering provider: Alver Fisher, Daryl Baker  12/22/23 1434 Resulting lab: [facility]   CLIA number: [id number]      Specimen Information      Source Collected On   Lumbar Puncture 12/22/23 1526              Components      Component Value Flag   Csf Fungus Culture No growth of fungus to date  --                  CRYPTOCOCCUS AG-CSF (Normal)  Resulted: 12/22/23 1908, Result status: Final result     Ordering provider: Alver Fisher, Daryl Baker  12/22/23 1434 Resulting lab: [facility]   CLIA number: [id number]      Specimen Information      Source Collected On   Lumbar Puncture 12/22/23 1526              Components      Component Value Flag   Cryptococcal AG Screen,CSF Negative  --                    Last July Nickson date: None  Last Cardioversion date: None  Echo procedures within the past 30 days:  US CAROTID DOPPLER BILATERAL  Result Date: 12/23/2023  CAROTID DOPPLER CLINICAL INDICATION: Male, 68 years old; watershed infarcts TECHNIQUE: Multiple grayscale, color Doppler and spectral Doppler ultrasound images were obtained throughout both carotid systems. COMPARISON: CT neck 12/08/2023 FINDINGS: RIGHT: CCA PSV: 71 cm/s ECA PSV: 107 cm/s ICA PSV: 75 cm/s ICA EDV: 29 cm/s ICA/CCA Ratio: <2 Vertebral artery: Normal antegrade, low resistive waveform. Grayscale images demonstrate no significant plaque or visible stenosis. Spectral analysis demonstrates no hemodynamically significant CCA or ICA stenosis. LEFT: CCA PSV: 121 cm/s ECA PSV: 92 cm/s ICA PSV: 76 cm/s ICA EDV: 31 cm/s ICA/CCA ratio: <2  Vertebral artery: Normal antegrade, low resistive waveform. Grayscale images demonstrate no significant plaque or visible stenosis. Spectral analysis demonstrates no hemodynamically significant CCA or ICA stenosis.     Normal carotid Doppler exam. IAC Vascular Testing Updated Recommendations for Carotid Stenosis Interpretation Criteria November 2023 [email or web address]  Finalized by Daryl Baker, M.D. on 12/23/2023 3:25 PM. Dictated by Daryl Baker, M.D. on 12/23/2023 3:22 PM.    2D + DOPPLER ECHO  Result Date: 12/23/2023  Technically a challenging study with suboptimal overall visualization, even with the use of echo contrast. Normal LV cavity size and systolic function, LVEF ~ 60% No obvious regional wall motion abnormalities on contrast enhanced images Unable to accurately assess diastolic parameters on this study Qualitatively normal RV size with mildly reduced systolic function Atria were not well-visualized.  Saline contrast was administered with views taken from a subcostal window.  No obvious right-to-left shunting was visualized. Most valvular structures were not well-visualized.  There appeared to be moderate mitral annular calcification.  The aortic valve is sclerotic.  There were no significant Doppler valvular abnormalities. The pulmonary artery pressure could not be estimated due to inadequate tricuspid regurgitation signal. No pericardial effusion There are no prior studies for comparison.     IR LUMBAR PUNCTURE  Result Date: 12/22/2023  Exam: Fluoroscopically guided lumbar puncture. History: Altered mental status. Anesthesia: Monitored anesthesia care. Technique: After obtaining informed written consent the patient was placed in a prone position on the procedure table. The lower back was prepped and draped in a sterile fashion. Lidocaine was used for local anesthesia. An 18-gauge guide needle was placed into the dorsal soft tissues.  A 22-gauge spinal needle was advanced into the intrathecal space. Opening pressures were obtained and fluid was removed as detailed below. The needle was removed. The patient tolerated procedure well left the department in stable condition.     1. Fluoroscopically guided lumbar puncture, opening pressures equal to 15 cm of water. 2. 12 mL clear CSF removed and sent to the laboratory for further analysis. I, Daryl Baker, M.D., the attending radiologist, was present for the procedure, personally reviewed the images, and formulated the interpretations and opinions expressed in this report. @TT   Finalized by Daryl Baker, M.D. on 12/22/2023 4:02 PM. Dictated by Daryl Baker, M.D. on 12/22/2023 4:02 PM.    MRI HEAD WO/W CONTRAST  Result Date: 12/22/2023  EXAM: MRI BRAIN HISTORY: Oropharyngeal cancer history, altered mental status TECHNIQUE: Multiplanar and multisequence MR imaging of the head was performed.   COMPARISON: CT head 12/08/2023 FINDINGS: Scattered small acute to early subacute bilateral cerebral white matter infarcts, which are predominantly located within the bilateral corona radiata, centrum semiovale and periatrial regions. Additional small subcortical infarct along at the junction of the medial right precentral gyrus and superior frontal gyrus (series 4, image 26). Stable mild generalized cerebral volume loss with concordant prominence of the ventricles and extra-axial spaces. Additional mild patchy cerebral white matter FLAIR hyperintensities. No abnormal enhancing intracranial lesion is identified. No midline shift or mass effect. The major intracranial arterial flow voids are preserved. Minimal left maxillary alveolar recess mucosal thickening. Mild layering pharyngeal secretions with partially visualized indwelling support device. Previously treated left oropharyngeal mass and cervical adenopathy much better evaluated on prior dedicated CT neck.     1.  Scattered small acute to subacute bilateral cerebral white matter infarcts. No significant intracranial mass effect. 2.  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 Findings were communicated with Dr. Hilma Baker by Dr. Ignacia Baker via phone at 2:45 PM on 12/22/2023.  Finalized by Daryl Baker, D.O. on 12/22/2023 2:46 PM. Dictated by Daryl Baker, D.O. on 12/22/2023 2:33 PM.    CTA CHEST PULM EMBOLISM W/CONT  Result Date: 12/15/2023  CTA CHEST Clinical Indication:  AHRF and tachycardia in patient with known malignancy, concern for PE, oropharyngeal cancer Technique: Multiple contiguous axial CT images were obtained through the chest following the administration of IV contrast.Pulmonary angiogram phase of postcontrast imaging was obtained. Post processing coronal and sagittal reconstruction images were made from the axial images.  Image post-processing was obtained.  Comparison: External CT chest 08/12/2023, CT neck 12/08/2023, PET/CT 10/03/2023 FINDINGS: Axilla, Mediastinum, and Hila: A few subcentimeter mediastinal nodes are more prominent. No axillary or hilar lymphadenopathy. Heart and Great Vessels: Right IJ chest port catheter tip terminates at the superior cavoatrial junction. Normal heart size. No pericardial effusion. Moderate coronary artery calcification. Normal caliber thoracic aorta. No central pulmonary artery filling defect. Evaluation for more distal pulmonary emboli is limited secondary to respiratory motion and contrast timing. No CT evidence of right heart strain. Airway, Lungs, and Pleura: Trace central airway secretions. Central bronchial wall thickening. Expiratory phase imaging. Bibasilar atelectasis and/or scarring. Development of clustered nodular opacities within the right lower lobe as well. Unchanged size of a 1.1 cm nodule in the right lower lobe (series 3 image 122), which did not previously demonstrate significant FDG uptake. Trace left pleural effusion. No pneumothorax. Upper Abdomen: Trace pneumoperitoneum, likely secondary to recent gastrostomy tube placement. Percutaneous gastrostomy tube tip terminates within the body of the stomach. Colonic diverticulosis. Chest  Wall and Osseous Structures: Thoracic spondylosis. DISH. Bilateral glenohumeral arthrosis. No destructive osseous lesion.     1.  No large or central pulmonary embolism. Evaluation of distal branches is limited secondary to respiratory motion. 2.  Development of a few clustered nodular and patchy opacities within the right greater than left lower lobes, probably infectious or aspiration-related. 3.  Increased prominence of a few subcentimeter mediastinal nodes, likely reactive in the above context. 4.  Stable well-circumscribed subpleural nodule in the posterior right lower lobe which did not demonstrate significant FDG uptake on previous PET/CT. By my electronic signature, I attest that I have personally reviewed the images for this examination and formulated the interpretations and opinions expressed in this report  Finalized by Daryl Riley, MD on 12/15/2023 10:24 AM. Dictated by Daryl Baker, Daryl Baker on 12/15/2023 9:46 AM.    CHEST SINGLE VIEW  Result Date: 12/15/2023  CHEST SINGLE VIEW INDICATION: New onset fever in neutropenic patient wiht unknown source. COMPARISON STUDY: Chest CT 08/12/2023. FINDINGS: Support Devices: Right IJ chest port catheter with tip in the mid SVC. Lungs/Pleura: Low lung volume with mild basilar opacity. No pleural effusion or pneumothorax. Heart and Mediastinum: Normal heart size.     Mild basilar opacities could simply represent subsegmental atelectasis in the setting of low lung volumes. Consider PA and lateral radiographs if patient's condition permits.  Finalized by Daryl Riley, MD on 12/15/2023 7:56 AM. Dictated by Daryl Riley, MD on 12/15/2023 7:54 AM.    IR GASTROSTOMY TUBE PLACEMENT  Result Date: 12/10/2023  G Tube placement under fluoroscopy and ultrasound Clinical history: Dysphagia, oropharyngeal mass Contrast: Omnipaque 300 Sedation:  I was personally responsible for the administration of moderate sedation services during the procedure performed and I confirm requirements described in CPT section on moderate sedation were followed, including the use of an independent trained observer who had no other duties during the procedure.  Please see the nursing log for the drug(s) utilized. The total face-to-face time was 15 or more minutes. Procedure: The risks benefits of the procedure explained and informed consent obtained. The patient was placed supine on the fluoroscopic table. The abdomen was prepped and draped in the usual sterile fashion. Under ultrasound guidance, the left lateral margin of the liver was identified. This area was then marked on the skin. The stomach which was then insufflated with air through the patient's existing Corpak. The inferior body of the stomach was localized under fluoroscopic guidance. 2% lidocaine was used to anesthetize the overlying soft tissues. Three gastroplasty  needles were placed in the stomach and the T-fasteners deployed. The stomach wall was then secured against the anterior abdominal wall. Next, a dermatotomy was then created with an 11-blade centrally within the gastroplasty sutures. An 18-gauge needle was then placed through the dermatotomy. A 0.035 Amplatz wire was then placed through the needle and curled in the stomach.  A 22 French telescoping sheath was then placed over the wire and the tract dilated. Next, the gastrostomy tube was placed over the wire and through the sheath. The peel-away  sheath then removed. The gastrostomy tube balloon was inflated and pulled against the stomach wall.  A small injection of contrast through the gastrostomy tube confirmed appropriate position within the stomach. The gastrostomy tube was then secured in place. The patient tolerated the procedure well and there were no complications.     Impression: Gastrostomy feeding tube placement as described above. I, Daryl Baker, M.D., the attending interventional radiologist, performed the entire  procedure, personally reviewed the images, and formulated the interpretations and opinions expressed in this report. @TT   Finalized by Daryl Baker, M.D. on 12/10/2023 10:36 AM. Dictated by Daryl Baker, M.D. on 12/10/2023 10:36 AM.    CT NECK W/CONTRAST  Result Date: 12/08/2023  CT NECK WITH CONTRAST HISTORY: 68 years old Male, MOUTH CANCER WITH PAIN TECHNIQUE: CT images of the neck were acquired with intravenous contrast. COMPARISON: CT head, same day. PET 10/03/2023. CT neck 08/27/2023. FINDINGS: INTRACRANIAL STRUCTURES AND ORBITS: No acute abnormality. SINUSES AND MASTOIDS: Mild left maxillary sinus mucosal thickening. Trace left mastoid fluid. PHARYNGEAL MUCOSA: There has been marked decrease size of the patient's left oropharyngeal mass involving the base of the tongue glossotonsillar sulcus and left tonsil. At the left palatine tonsil there continues to be a centrally fluid attenuation lesion measuring 1.5 x 1.7 cm. There is circumferential mucosal thickening compatible with post therapeutic change. Mild asymmetric thickening at the left vallecula remains. ORAL CAVITY: The oral tongue and floor of the mouth appear unremarkable. LARYNX AND TRACHEA: Mild mucosal edema of the supraglottic larynx. Normal glottic and subglottic larynx. LYMPH NODES AND SOFT TISSUES: Decrease in size of bilateral cervical lymphadenopathy which previously was hypermetabolic on PET scan imaging. For reference the prior dominant left level 2 lymph node which measured 4.6 x 3.0 cm in November 2024 today measures 2.3 x 1.1 cm. SALIVARY GLANDS: Normal parotid and submandibular glands. THYROID: Normal. VESSELS AND CAROTID SPACE: Indwelling right IJ chest port. Vasculature is otherwise unremarkable. BONES: Moderate to marked cervical spondylosis. UPPER THORAX: Visualized lung apices are clear.     1.  Marked decrease size of the prior large left oropharyngeal mass. There is a residual 1.7 x 1.5 cm lesion involving the left tonsil with central fluid attenuation. Treated necrotic tumor is the leading consideration. A post therapeutic retention cyst could appear similar. An abscess is thought less likely though direct visualization and clinical correlation is recommended regarding symptoms of infection. 2.  Improvement of bilateral cervical lymphadenopathy. By my electronic signature, I attest that I have personally reviewed the images for this examination and formulated the interpretations and opinions expressed in this report  Finalized by Daryl Baker, M.D. on 12/08/2023 12:00 PM. Dictated by Daryl Pitch, MD on 12/08/2023 11:07 AM.    CT HEAD WO CONTRAST  Result Date: 12/08/2023  EXAM: CT HEAD HISTORY: Hallucinations. Oropharyngeal cancer. TECHNIQUE: Multiple contiguous axial images were obtained of the brain without intravenous contrast. COMPARISON: PET 10/03/2023. CT NECK NOVEMBER 2024. CT NECK, SAME DAY. FINDINGS: Mild cerebral volume loss appears grossly within expected limits for age. The ventricle sizes are normal. Mild patchy supratentorial white matter hypodensities. No midline shift or mass effect. The gray white matter interfaces are maintained. The basal cisterns are patent. No evidence of acute intracranial hemorrhage or extra-axial fluid collection. There is a small amount of inferior left maxillary sinus mucosal thickening and trace left mastoid fluid. Mild calcifications within the bilateral supraclinoid ICAs and vertebral arteries. Neck findings are described separately on the CT of the neck.     1.  No acute intracranial hemorrhage or mass effect. 2.  Mild patchy cerebral white matter hypodensities, likely sequelae of chronic microvascular ischemia. 3.  The CT neck is dictated separately. Please see that report. By my electronic signature, I attest that I have personally reviewed the images for this examination and formulated the interpretations and opinions expressed in this report  Finalized by Daryl Baker, M.D. on 12/08/2023 12:00 PM. Dictated by Daryl Pitch, MD  on 12/08/2023 10:51 AM.        Device Information on File  No results found for: GENERATOR, EPDEVTYP      Additional Comments:  None    Plan:  Dr. Danella Deis will NOT plan to proceed with the  Llewelyn Sheaffer today 12/25/2023. Dr. Danella Deis cancelled Kaliopi Blyden and will discuss with attending doctors for more test prior doing Arionne Iams

## 2023-12-25 NOTE — Rehab Pre-Admission Screening
 Physical Medicine & Rehabilitation Pre-Admission Screening    Daryl Baker is a 68 y.o.  male.     DOB: 1956/03/24                  MRN#:  [id number]  Primary Insurance: [name] MEDICARE  Financial Class: Medicare Repl  Date of Hospital Admission:  12/08/2023  Date of Expected Rehab Admission:  03/  /2025  Precautions:  Fall  Weight bearing Precautions:  [facility] Course:       Daryl Baker is a pleasant 68 y.o. 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         During this admission, pain managed with support from palliative care. He is receiving IV dilaudid 0.5-1mg  a couple of times a day currently for primarily pain of his throat.  AMS has been secondary to metabolic encephalopathy and stroke. MRI brain 12/22/23 demonstrating scattered small acute to early subacute bilateral cerebral white matter infarcts, LP with elevated protein. On high dose thiamine. Neurology following for bilateral scattered infarcts and AMS. Suspected to be from hypoperfusion or embolic. Started on ASA 81.  He has been treated with antibiotics for presumed aspiration pneumonia. Gtube placed 12/10/23, on tube feeds. Has elevated blood sugars. He completed radiation 12/18/23. Remains afebrile, blood pressures controlled.  Hospital course has been complicated by pain, malnutrition, Type 2 DM, aspiration pneumonia, impaired mobility and activities of daily living.     Patient has been working with physical and occupational therapy since 12/22/2023 and has been making functional progress . Patient is anticipated to return to home at the Modified with walker / stand by assist level of care.      Patient  will also receive formal cognitive evaluation to assess for baseline status and any new cognitive/linguistic deficits.         Prior Level of Function        Self-Care/ADLs: Independent with ADL and community mobility with device   Mobility:  Independent with ADL and community mobility with device   Work/Personal Responsibilities/Hobbies:  Self Employed    Home Environment:  Type of Home: House (12/24/2023  1:00 PM)   Entry Stairs: No stairs; Ramp (12/24/2023  1:00 PM)   In-Home Stairs: Able to live on main level (12/24/2023  1:00 PM)       Support System:  Spouse         Current Level of Function    Physical Therapy: (12/24/2023)  Bed Mobility/Transfer  Comments: Patient sitting up in bedside chair upon arrival to room and end of session.  Transfer Type: Sit to/from Stand  Transfer: Assistance Level: To/From;Bed Side Chair;Minimal Assist  Transfer: Assistive Device: 4-Wheeled Walker  Transfers: Type Of Assistance: For Balance;For Safety Considerations  End Of Activity Status: Up in Chair;Nursing Notified;Instructed Patient to Request Assist with Mobility;Instructed Patient to Use Call Light (alarm activated)     Gait  Gait Distance: 350 feet  Gait: Assistance Level: Minimal Assist  Gait: Assistive Device: Roller Walker  Gait: Descriptors: Pace: Slow;Swing-Through Gait;Decreased step length  Comments: Cues to keep walker close to body. Two standing rest breaks due to fatigue and SOA.  Activity Limited By: Complaint of Fatigue    Occupational Therapy: (12/24/2023)  ADL's  UE Dressing Assist: Stand By Assist  UE Dressing Deficits: Setup  Toileting Assist: Minimal Assist  Toileting Deficits: Steadying;Supervision/Safety  Comment: pt dons t shirt seated EOB with setup. ambulates to bathroom for toileting, steadying assist in standing for clothing management     ADL Mobility  Bed Mobility: Supine to Sit: Standby assist  Bed Mobility: Sit to Supine: Standby assist  Transfer Type: Sit to/from stand  Transfer: Assistance Level: From;Bed;Toilet;Minimal assist  Transfer: Assistive Device: 4-wheeled walker  End of Activity Status: In bed;Instructed patient to request assist with mobility;Instructed patient to use call light  Transfer Comments: steadying assist. pt requests to use personal rollator despite education of risks as it is missing one brake  Gait Distance: 100 feet  Gait: Assistance Level: Minimal assist  Gait: Assistive Device: 4-wheeled walker  Gait Comments: pt ambulates in room. steadying assist and intermittent cues for safety     Cognition  Cognition Comment: pt continues with intermittent confusion and tangential speech. requires cues for safety          Rehabilitation Plan    Rehab Diagnosis and conditions that require Rehabilitation:      Stroke  Balance impairment, Cognitive impairment, and Weakness       Active Comorbidities/Risk of Medical Complications:     Oropharyngeal Mass / Stroke: Patient will continue to have daily Physician oversight while in acute inpatient rehab.   Stroke: Cognitive Impairment: Patient with cognitive impairment.  May need 24 hour supervision upon discharge home.  Impaired balance:Patient will work with physical therapy for gait retraining, balance training, and neuromuscular re-education.  Muscle Weakness:Patient has demonstrated muscle weakness in the LE bilaterally, based upon the rehab consult physical examination. Patient would continue to work with PT/OT to improve strength in order to prevent falls and other injuries.  Anemia: Will need to monitor and manage, as this may negatively impact patient's endurance with therapy.  Diabetes: Patient is at risk for hyper/hypoglycemia during aggressive mobilization with therapies, and will need daily physician monitoring and adjustment of medications.  Patient may benefit from ongoing education from diabetes nurse educator for healthy lifestyle modifications.  Encephalopathy: Patient with cognitive impairment and is at risk for sundowning, confusion, and inappropriate behaviors.  Staff will provide frequent re-orientation, low stimulation environment, maintain sleep-wake cycle, and use behavioral modification plan as needed.  Gait Abnormality: Patient will work with physical therapy for gait retraining, balance training, and neuromuscular re-education.  Hyponatremia: Patient is at risk for worsening electrolyte imbalance and will require physician monitoring of labs and adjustments to medications and fluid intake.  Will continue to monitor and manage, as this may affect patient's cognitive status.  Hypocalcemia:  Patient is at risk for worsening electrolyte imbalance and will require physician monitoring of labs and adjustments to medications and fluid intake.  Monitor for signs and symptoms spasms of the hands and feet, muscle cramps and over reactive neurological reflexes. Patient may benefit from calcium and Vitamin D supplements for treatment.  Malnutrition: Patient's BMI is 31.64 and is at risk for malnutrition.  Consult Dietician for recommendations on supplements.  Neuropathy: Patient is at increased risk for wounds, falls, and gait abnormality and needs rehabilitation nursing care to reinforce proper safety and skin precautions.  Teach daily skin checks with rehabilitation nursing.  Risk for Pain: There is a risk of uncontrolled pain, risk of failure to participate in therapies, and risk of sedation due to pain medications.  This will require daily medication adjustments to find the balance between meaningful participation in therapies and pain control, avoid the potential for addiction, while facilitating the rehabilitation for their condition.  Will continue to monitor and adjust medication regimen in order to maximize pain relief.  Thrombocytopenia:  Patient is at risk for bleeding and needs daily physician monitoring of labs and blood loss prevention.  Risk for Thrombosis: Patient has Lovenox, sequential compression devices ordered.  Patient will be encouraged to ambulate frequently with the assistance of health care provider.                Patient will receive Physical therapy, Occupational therapy, and Speech therapy each 60 minutes a day, 5 days a week for a total of 3 hours daily (minimum) for the duration of the rehabilitation stay within an interdisciplinary rehabilitation program with case manager/social worker, dietician, neuropsychologist, rehab nursing and PM&R oversight.    Rehabilitation Prognosis: Fair to Good  Medical Prognosis: The patient is deemed medically stable to tolerate, benefit from, and participate in IRF level services.  Tolerance for three hours of therapy a day: Good  Patient has participated well with therapies since 12/22/2023 in the acute care setting and is anticipated to tolerate therapy as required.       Goals/Barriers/Facilitators  Family / Patient Goals: return home with family assistance  Mobility Goals: Overall goal is Independent Setup or clean-up assistance and Physical Therapy will evaluate and treat ambulation/wheelchair use and bed transfers  Activities of Daily Living (ADLs) Goals: Overall goal is Independent Setup or clean-up assistance Supervision or touching assistance and Occupational therapy will evaluate and treat basic Activities of Daily Living  Cognition / Communication Goals: Speech intact, Overall goal is Independent Setup or clean-up assistance, and Speech therapy will evaluate and treat cognition and communication deficits and assess for safe swallow         Barriers & Interventions:   Caregiver Apprehension: Arrange caregiver support and discuss barriers and patient progress with caregivers when appropriate.  High Burden of Care: Initiate interdisciplinary rehabilitation to improve functional independence and reduce burden of care.  Medication Education:  Pharmacist and nursing staff to provide education to patient and family regarding medication side effects, special precautions, and safe administration.  Facilitators: controlled pain, patient motivation, and improving strength / endurance    Discharge Planning  Expected Length of Stay  10-14  day(s)  Expected Discharge Disposition Home          Dorann Ou, RN, BSN

## 2023-12-25 NOTE — Progress Notes
 PALLIATIVE CARE INPATIENT NOTE     Name: Daryl Baker            MRN: [id number]                DOB: January 03, 1956          Age: 68 y.o.  Admission Date: 12/08/2023             LOS: 17 days    ASSESSMENT/PLAN   Daryl Baker is a 68 y.o. 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 intake. The palliative care team was consulted for assistance with symptom management for radiation induced mucositis.    # Pain, secondary to malignancy  # Oropharyngeal carcinoma, left, HPV mediated  # Dysphagia  # Poor p.o. intake    Discussion: Palliative care team saw with Daryl Baker. His wife, Bonita Quin, and sister in law were present at bedside. Daryl Baker would like to know when he can expect to move from the hospital to the inpatient rehab center. His wife is curious about how his inpatient rehab stay will impact his chemo schedule and when Daryl Baker will receive his next treatment. They shared they live fairly far away from [place] and want to see if they can get the last cycle while he is here in [facility]. Palliative team will reach out to primary to regarding the timing of the next chemo treatment.     Our time with Daryl Baker was short on 3/27 due to his scheduled TEE.       RECOMMENDATIONS:   Stable mental status , very alert and conversational   Continue fentanyl patch to 50 mcg/h  Continue Oxycodone 15 mg Q3H PRN - (used 4 doses in past 24 hours)  Discontinue Dilaudid 0.5 to 1mg  IV Q2 hours PRN   Continue gabapentin 300 mg 3 times daily  Continue senna 2 tab BID, continue miralax 1 packet twice daily.   Awaiting further Neurologic work up   Ulcerative lesion in mouth is improving may still need days to weeks for ongoing healing  Patient's goals remain aggressive at this time   They are agreeable to outpatient [facility] referral for when he gets out of the hospital, may not be able to be seen until discharged from rehab  Palliative Care will continue to follow     Kandis Mannan, Medical Student  Patient seen and discussed with Dr. Stevphen Rochester    ATTESTATION    I personally performed or re-performed the history, physical exam and treatment for the E/M. I discussed the case with the Medical Student, and concur with the Medical Student documentation of history, physical exam and treatment plan unless otherwise noted.         Narda Bonds DO   Palliative Care  Available by Voalte and AMS     Total Time Today was 40 minutes in the following activities: Preparing to see the patient, Obtaining and/or reviewing separately obtained history, Performing a medically appropriate examination and/or evaluation, Counseling and educating the patient/family/caregiver, Referring and communication with other health care professionals (when not separately reported), Documenting clinical information in the electronic or other health record, and Independently interpreting results (not separately reported) and communicating results to the patient/family/caregiver      PALLIATIVE CARE PLANNING     Advance Care Planning:   Identified Health Care Decision  Maker:    DPOA - Would benefit from completion of DPOA  TPOPP - Not discussed    PC Clinic - NA    Medication safety - Advise a Narcan prescription on discharge    Disposition planning: Pending     SUBJECTIVE     CC/Reason for Visit: Symptoms (pain)    Interval HPI: Patient reports his pain is it a 9/10. Patient shows no signs of sedation with current opioid regimen. He is very awake and alert, willing to ask and answer questions, and actively participating in his own care.       ROS: Review of Systems   HENT:  Positive for sinus pain.    Cardiovascular:  Positive for leg swelling.   Gastrointestinal:         Constipation, improving      OBJECTIVE     Blood pressure (!) 145/72, pulse 88, temperature 36.6 ?C (97.9 ?F), height 180.3 cm (5' 11), weight 105.2 kg (232 lb), SpO2 (!) 91%.  Physical Exam  Constitutional:       Comments: Uncomfortable appearing male, sitting up in in bed, looks less uncomfortable.   HENT:      Head: Normocephalic.      Mouth/Throat:      Comments: One small (<1cm) ulcerative lesion in left lower buccal mucosa, nonbleeeding   Eyes:      Extraocular Movements: Extraocular movements intact.   Cardiovascular:      Rate and Rhythm: Normal rate.   Pulmonary:      Effort: Pulmonary effort is normal.   Abdominal:      General: There is distension.      Palpations: Abdomen is soft.      Tenderness: There is no abdominal tenderness.   Musculoskeletal:         General: Normal range of motion.      Cervical back: Normal range of motion.      Right lower leg: Edema present.      Left lower leg: Edema present.   Skin:     General: Skin is warm and dry.      Coloration: Skin is pale.   Neurological:      Mental Status: He is alert. He is confused.   Psychiatric:         Attention and Perception: He is inattentive.         Behavior: Behavior is cooperative.       Lab Results:  CBC   Lab Results   Component Value Date/Time    WBC 7.70 12/25/2023 03:07 AM    HGB 8.1 (L) 12/25/2023 03:07 AM    PLTCT 489 (H) 12/25/2023 03:07 AM     Lab Results   Component Value Date/Time    NEUT 60.0 12/18/2023 05:59 AM    ANC 5.3 12/25/2023 03:07 AM      Chemistries   Lab Results   Component Value Date/Time    NA 136 (L) 12/25/2023 03:07 AM    K 4.5 12/25/2023 03:07 AM    BUN 17 12/25/2023 03:07 AM    CR 0.55 12/25/2023 03:07 AM    GLU 169 (H) 12/25/2023 03:07 AM     Lab Results   Component Value Date/Time    CA 7.9 (L) 12/25/2023 03:07 AM    PO4 3.9 12/15/2023 04:27 AM    ALBUMIN 3.1 (L) 12/25/2023 03:07 AM    TOTPROT 5.7 (L) 12/25/2023 03:07 AM    ALKPHOS 66 12/25/2023 03:07 AM    AST  14 12/25/2023 03:07 AM    ALT 10 12/25/2023 03:07 AM    TOTBILI 0.2 12/25/2023 03:07 AM    GFR >60 12/25/2023 03:07 AM        Other Pertinent Diagnostic Results:     MRI 12/22/23  1.  Scattered small acute to subacute bilateral cerebral white matter   infarcts. No significant intracranial mass effect.   2.  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     Kandis Mannan, Medical Student  Patient seen and discussed with Dr. Stevphen Rochester

## 2023-12-26 ENCOUNTER — Encounter: Admit: 2023-12-26 | Discharge: 2023-12-26

## 2023-12-26 ENCOUNTER — Inpatient Hospital Stay: Admit: 2023-12-26 | Discharge: 2023-12-26

## 2023-12-26 LAB — MANUAL DIFF
~~LOC~~ BKR BLASTS - RELATIVE: 1 %
~~LOC~~ BKR EOSINOPHILS - RELATIVE: 1 % (ref 0–5)
~~LOC~~ BKR LYMPHOCYTES - RELATIVE: 4 % — ABNORMAL LOW (ref 24–44)
~~LOC~~ BKR METAMYELOCYTES - RELATIVE: 4 %
~~LOC~~ BKR MONOCYTES - RELATIVE: 14 % — ABNORMAL HIGH (ref 4–12)
~~LOC~~ BKR MYELOCYTES - RELATIVE: 8 %
~~LOC~~ BKR NEUT+BANDS - ABSOLUTE: 6.9 10*3/uL (ref 1.8–7.0)
~~LOC~~ BKR NEUTROPHILS - RELATIVE: 68 % (ref 41–77)

## 2023-12-26 LAB — COMPREHENSIVE METABOLIC PANEL
~~LOC~~ BKR ALBUMIN: 2.9 g/dL — ABNORMAL LOW (ref 3.5–5.0)
~~LOC~~ BKR ALK PHOSPHATASE: 67 U/L — ABNORMAL HIGH (ref 25–110)
~~LOC~~ BKR ALT: 10 U/L — ABNORMAL LOW (ref 7–56)
~~LOC~~ BKR ANION GAP: 9 10*6/uL — ABNORMAL LOW (ref 3–12)
~~LOC~~ BKR AST: 18 U/L — ABNORMAL HIGH (ref 7–40)
~~LOC~~ BKR CALCIUM: 8.3 mg/dL — ABNORMAL LOW (ref 8.5–10.6)
~~LOC~~ BKR CO2: 30 mmol/L — ABNORMAL HIGH (ref 21–30)
~~LOC~~ BKR CREATININE: 0.5 mg/dL — ABNORMAL HIGH (ref 0.40–1.24)
~~LOC~~ BKR GLOMERULAR FILTRATION RATE (GFR): >60 mL/min — ABNORMAL LOW (ref >60–11.00)
~~LOC~~ BKR TOTAL BILIRUBIN: 0.3 mg/dL — ABNORMAL HIGH (ref 0.2–1.3)
~~LOC~~ BKR TOTAL PROTEIN: 5.9 g/dL — ABNORMAL LOW (ref 6.0–8.0)

## 2023-12-26 LAB — ENTEROVIRUS PCR

## 2023-12-26 LAB — POC GLUCOSE
~~LOC~~ BKR POC GLUCOSE: 271 mg/dL — ABNORMAL HIGH (ref 70–100)
~~LOC~~ BKR POC GLUCOSE: 184 mg/dL — ABNORMAL HIGH (ref 70–100)
~~LOC~~ BKR POC GLUCOSE: 218 mg/dL — ABNORMAL HIGH (ref 70–100)
~~LOC~~ BKR POC GLUCOSE: 315 mg/dL — ABNORMAL HIGH (ref 70–100)

## 2023-12-26 MED ORDER — SODIUM CHLORIDE 0.9 % IJ SOLN
50 mL | Freq: Once | INTRAVENOUS | 0 refills | Status: CP
Start: 2023-12-26 — End: ?
  Administered 2023-12-27: 01:00:00 50 mL via INTRAVENOUS

## 2023-12-26 MED ORDER — IOHEXOL 350 MG IODINE/ML IV SOLN
65 mL | Freq: Once | INTRAVENOUS | 0 refills | Status: CP
Start: 2023-12-26 — End: ?
  Administered 2023-12-27: 01:00:00 65 mL via INTRAVENOUS

## 2023-12-26 NOTE — Progress Notes
 OCCUPATIONAL THERAPY  PROGRESS NOTE      Name: Daryl Baker   MRN: [id number]     DOB: 1956/04/26      Age: 68 y.o.  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 intake.  Mental / Cognitive: Alert;Oriented;Cooperative;Follows commands;Tangential  Pain: No complaint of pain  Pain Interventions: Patient assisted into position of comfort    Home Living Situation  Lives With: Spouse/significant other  Type of Home: House  Entry Stairs: No stairs;Ramp  In-Home Stairs: Able to live on main level  Patient Owned Equipment: Walker: Rollator  Comments: Patient lives with spouse and special needs child. Patient lives on main level, spouse and child live upstairs. Patients spouse with recent shoulder surgery and providing cares for child, so generally unable to provide assistance for patient. Can provide intermittent supervision. Spouse concerned about increase in patients confusion and ability to take care of himself, as he has largely been in bed since admission. She reports patient has called her in the middle of the night asked her to pick him up because he feels like he is being held hostage.    Prior Level of Function  Level Of Independence: Independent with ADL and community mobility with device  History of Falls in Past 3 Months: No    ADL's  UE Dressing Assist: Stand By Assist  UE Dressing Deficits: Setup  LE Dressing Assist: Minimal Assist  LE Dressing Deficits: Setup;Supervision/Safety;Steadying;Thread RLE Into Pants;Thread LLE Into Pants;Pull Up Over Hips;Don/Doff R Shoe;Don/Doff L Shoe  Comment: pt dons t shirt, pants, and shoes sitting EOB with setup and CGA in standing to pull pants over hips    ADL Mobility  Bed Mobility: Supine to Sit: Standby assist  Transfer Type: Sit to/from stand  Transfer: Assistance Level: From;Bed;Wheelchair;Minimal assist  Transfer: Assistive Device: 4-wheeled walker  End of Activity Status: Up in chair;Instructed patient to request assist with mobility;Instructed patient to use call light;Nursing notified  Gait Distance: 15 feet  Gait: Assistance Level: Minimal assist  Gait: Assistive Device: 4-wheeled walker    Cognition  Cognition Comment: pt requesting to go outside for some fresh air. transported pt in wc outside for ~10 minutes    Assessment  Assessment: Decreased ADL Status;Decreased Safe/Judg during ADL;Decreased Cognition;Decreased Endurance;Decreased Self-Care Trans;Decreased High-Level ADLs  Prognosis: Good;w/Cont OT s/p Acute Discharge  Goal Formulation: Patient  Comments: Pt is limited by  continued confusion, impaired balance, and generalized weakness. He would benefit from continued therapy in an inpatient setting as pt must be independent to return home. Pending acute length of stay, may progress for home discharge. Could consider OT KELS evaluation closer to expected dc to evaluate cognitive needs    AM-PAC 6 Clicks Daily Activity Inpatient  Putting on and taking off regular lower body clothes: A Little  Bathing (Including washing, rinsing, drying): A Little  Toileting, which includes using toilet, bedpan, or urinal: A Little  Putting on and taking off  regular upper body clothing: None  Taking care of personal grooming such as brushing teeth: None  Eating meals: None  Daily Activity Raw Score: 21  Standardized (T-scale) Score: 44.27    Plan  Progress: Progressing Toward Goals  OT Frequency: 5x/week  OT Plan for Next Visit: Dynamic balance during functional tasks, grooming at sink, progress endurance    ADL Goals  Patient Will Perform All ADL's: w/ Modified Independence    Functional Transfer Goals  Pt Will Perform All Functional Transfers: Modified Independent    OT Discharge Recommendations  Recommendation: Inpatient setting;Recommend rehab medicine consult    Therapist: Lorella Nimrod, OTR/L [id number]  Date: 12/26/2023

## 2023-12-26 NOTE — Progress Notes
 PHYSICAL THERAPY  PROGRESS NOTE          Name: Daryl Baker   MRN: [id number]     DOB: [birth year]      Age: 68 y.o.  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 intake.  Mental / Cognitive: Alert;Oriented;Cooperative;Follows commands;Tangential  Pain: No complaint of pain  Pain Interventions: Patient assisted into position of comfort    Home Living Situation  Lives With: Spouse/significant other  Type of Home: House  Entry Stairs: No stairs;Ramp  In-Home Stairs: Able to live on main level  Patient Owned Equipment: Walker: Rollator  Comments: Patient lives with spouse and special needs child. Patient lives on main level, spouse and child live upstairs. Patients spouse with recent shoulder surgery and providing cares for child, so generally unable to provide assistance for patient. Can provide intermittent supervision. Spouse concerned about increase in patients confusion and ability to take care of himself, as he has largely been in bed since admission. She reports patient has called her in the middle of the night asked her to pick him up because he feels like he is being held hostage.    Prior Level of Function  Level Of Independence: Independent with ADL and community mobility with device  History of Falls in Past 3 Months: No    Bed Mobility/Transfer  Comments: Patient sitting up in bedside chair upon arrival to room and end of session.  Transfer Type: Sit to/from Stand  Transfer: Assistance Level: To/From;Bed Side Chair;Minimal Assist (contact guard assist)  Transfer: Assistive Device: 4-Wheeled Walker  Transfers: Type Of Assistance: For Balance;For Safety Considerations  End Of Activity Status: Up in Chair;Nursing Notified;Instructed Patient to Request Assist with Mobility;Instructed Patient to Use Call Light (alarm activated)    Gait  Gait Distance: 600 feet  Gait: Assistance Level: Minimal Assist (CGA)  Gait: Assistive Device: Roller Walker  Gait: Descriptors: Pace: Slow;Swing-Through Gait;Decreased step length  Comments: One seated rest break due to fatigue.  Activity Limited By: Complaint of Fatigue    Assessment/Progress  Impaired Mobility Due To: Impaired Balance;Deconditioning  Assessment/Progress: Should Improve w/ Continued PT;Expect Good Progress    AM-PAC 6 Clicks Basic Mobility Inpatient  Turning from your back to your side while in a flat bed without using bed rails: None  Moving from lying on your back to sitting on the side of a flat bed without using bedrails : None  Moving to and from a bed to a chair (including a wheelchair): None  Standing up from a chair using your arms (e.g. wheelchair, or bedside chair): None  To walk in hospital room: A Little  Climbing 3-5 steps with a railing: A Little  Basic Mobility Inpatient Raw Score: 22  Standardized (T-scale) Score: 47.4  AM-PAC Basic Mobility Functional Stage: 34-51 Limited Mobility Indoors  Mobility Goal [facility]: JH-HLM 7 Walk >= 25 ft    Goals  Goal Formulation: With Patient  Time For Goal Achievement: 5 days  Patient Will Go Supine To/From Sit: Independently, Ongoing  Patient Will Transfer Bed/Chair: Independently, Ongoing  Patient Will Transfer Sit to Stand: Independently, Ongoing  Patient Will Ambulate: Greater than 200 Feet, w/ Walker, Independently, Ongoing    Plan  Treatment Interventions: Mobility training;Strengthening;Balance activities;Endurance training  Plan Frequency: 5 Days per Week  PT Plan for Next Visit: Progress gait distance/independence. Decrease frequency and update recs pending progression.    PT Discharge Recommendations  Recommendation: Inpatient setting;Recommend rehab medicine consult  Patient Currently Requires Physical Assist With: Ambulation  Patient Currently Requires Equipment: Owns what is needed  Comments: Patient may be able to progress to home discharge pending LOS. Will need to be essentially independent to safely d/c home.    Therapist: Lauretta Chester, PTA  Date: 12/26/2023

## 2023-12-26 NOTE — Progress Notes
 General Progress Note    Name:  Daryl Baker     Today's Date:  12/26/2023  Admission Date: 12/08/2023  LOS: 18 days                     Assessment/Plan:    Principal Problem:    Oropharyngeal mass  Active Problems:    Oropharyngeal cancer (CMS-HCC)    Severe malnutrition    Mouth pain    Drug-induced constipation    Encephalopathy acute      68 y.o. 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 He has had increasing pain over the last few days prior to admission near his oropharyngeal mass on the left along with the left aspect of his tongue preventing any PO intake and has a scheduled G-tube placement.    Multiple Brain Infarcts  Acute metabolic encephalopathy  -Differential Dx: emboli vs watershed infarcts  -Blood sugar  = normal/ elevated  -Na = wnl, Ca = 8.3  -3/17 venous pH = 7.42  -TSH = 0.39  -Hx of cisplatin 5 cycles, which can cause encephalopathy/PRES  -currently denying headache, has been stooling regularly  -Had a course of thiamine during admission  -EEG: mild encephalopathy  -MRI attempted x2 with ativan but patient was unable to tolerate without anesthesia  -MRI brain: Scattered small acute to subacute bilateral cerebral white matter Infarcts concerning for emboli.  -Lumbar Puncture: elevated protein, cultures NGTD, cytology pending  -Lipid Profile: wnl;  last HbA1c = 7.6 (12/16/23)  -HSV & crypto neg  -TTE: valves not visualized, TEE: ordered  -carotid ultrasound: normal  -HSV & crypto neg  -Cardiac CT without evidence of vegetation  Plan:  > Daily thiamine supplementation  > Scheduled melatonin & trazodone increased to 50mg , continue delirium precautions  > Neurology consulted- start ASA 81, optimize BP & diabetes, not consistent with PRES despite cisplatin, likely watershed infarct    Neutropenic fever- resolved  Presumed Aspiration pneumonia- resolved  Sepsis- resolved  -tachycardia, fever, increase in WBC (leukopenic at baseline)  -source likely pulmonary given imaging findings  -UA unremarkable  -CTA Chest: Development of a few clustered nodular and patchy opacities within the   right greater than left lower lobes, probably infectious or aspiration-related.   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  Plan:  > Trulicity nonformulary, will hold inpatient  > glyburide 2.5 mg daily & metformin 500 BID for hyperglycemia via G-tube    Dysphagia   Oropharyngeal pain  -Due to left oropharyngeal cancer  -G-tube placed 3/12 AM  -He will need the G-tube feeds for >90 days.  Plan:  >Magic mouthwash TID PRN for oropharyngeal pain  > Consulted Dietician  >Transitioned to tube feeds 3/15, formal recs in dietician note  > Palliative Care consult for pain management as above.   -Fentanyl patch 27mcg/hr  -Oxycodone 15mg  q4h PRN  -Gabapentin 300mg  TID  -Bowel regimen with senokot-s 2ts BID, miralax BID  >Fluconazole 400mg  qday for oral candidiasis until 4/4     Left HPV-Mediated Oropharyngeal SCC  -Currently on chemo and radiation  -  Follow in Oncology clinic with dr Neita Carp and radiation Oncology Dr Mariane Masters  -CT Neck revealed:  1.  Marked decrease size of the prior large left oropharyngeal mass. There   is a residual 1.7 x 1.5 cm lesion involving the left tonsil with central   fluid attenuation. Treated necrotic tumor is the leading consideration. A   post therapeutic retention cyst could appear similar. An abscess is   thought less likely though direct visualization and clinical correlation   is recommended regarding symptoms of infection.   2.  Improvement of bilateral cervical lymphadenopathy. -Radiation Oncology finished radiation while inpatient.(3/17-3/19)  Plan:  > Triamcinolone cream for radiation induced dermatitis    Pancytopenia   Neutropenia  -ANC <1  -likely due to Chemo & radiation  -follow with daily CBC w/ Diff     Hypertension  >Continue to hold metoprolol & losartan    FEN:  Bolus tube feeds with free water flushes/Replace electrolytes as needed/Tube feeds  DIET CLEAR LIQUID w/ Body mass index is 32.36 kg/m?Marland Kitchen   DVT PPX:  40mg  qday enoxaparin, SCDs bilat  Code status: Full Code     Patient seen and case discussed with Dr. Beckie Salts, DO  Internal Medicine Resident PGY3  Available on Voalte  ________________________________________________________________________    Subjective  Patient seen and examined at bedside this morning. Doing well and looking forward to going to rehab when able. No concerns or complaints. No acute overnight events.      Medications  Scheduled Meds:aspirin chewable tablet 81 mg, 81 mg, PEG Tube, QDAY  Diet Enteral Feeding Bolus, , SEE ADMIN INSTRUCTIONS, 5XDAY (6,10,14,18,22   And  Water Bolus, 100 mL, PEG Tube, 5XDAY (6,10,14,18,22  enoxaparin (LOVENOX) syringe 40 mg, 40 mg, Subcutaneous, QDAY(21)  fentaNYL (DURAGESIC) 50 mcg/hr patch 1 patch, 1 patch, Transdermal, Q72H*   And  Verification of Patch Placement and Integrity - fentaNYL 50 mcg/hr, , Transdermal, BID  fluconazole (DIFLUCAN) oral suspension 200 mg, 200 mg, Oral, QDAY  gabapentin (NEURONTIN) capsule 300 mg, 300 mg, Per G Tube, TID  glyBURIDE (DIABETA) tablet 2.5 mg, 2.5 mg, Oral, QDAY  melatonin tablet 10 mg, 10 mg, Oral, QHS  metFORMIN (GLUCOPHAGE) tablet 500 mg, 500 mg, Oral, BID w/meals  polyethylene glycol 3350 (MIRALAX) packet 17 g, 1 packet, Per G Tube, QDAY  sennosides-docusate sodium (SENOKOT-S) tablet 2 tablet, 2 tablet, Per G Tube, QDAY  thiamine mononitrate (vit B1) tablet 100 mg, 100 mg, PEG Tube, QDAY  traZODone (DESYREL) tablet 75 mg, 75 mg, Oral, QHS  triamcinolone acetonide (KENALOG) 0.1 % topical cream 1 g, 1 g, Topical, BID    Continuous Infusions:      PRN and Respiratory Meds:acetaminophen Q6H PRN, bisacodyL QDAY PRN, dextrose 50% (D50) IV PRN, diphenhydrAMINE/lidocaine/mag,al-simeth(#) TID PRN, ondansetron Q6H PRN **OR** ondansetron (ZOFRAN) IV Q6H PRN, oxyCODONE Q3H PRN, pancrelipase 20,880 Units/sodium bicarbonate 650 mg ([place] CLOG DESTROYER) PRN (On Call from Rx), phenoL PRN, sodium chloride PRN        Objective:                          Vital Signs: Last Filed                 Vital Signs: 24 Hour Range   BP: 147/79 (03/28 1128)  Temp: 36.2 ?C (97.1 ?F) (03/28 1128)  Pulse: 108 (03/28 1128)  Respirations: 16 PER MINUTE (03/28 1128)  SpO2: 97 % (03/28 1128)  O2 Device: None (Room air) (03/28 1128) BP: (114-147)/(63-85)   Temp:  [36.2 ?C (97.1 ?F)-37 ?C (98.6 ?F)]   Pulse:  [90-128]   Respirations:  [16 PER MINUTE]   SpO2:  [92 %-97 %]   O2 Device: None (Room air)   Intensity Pain Scale (Self Report): 9 (12/26/23 0908) Vitals:    12/09/23 1715 12/23/23 0752 12/25/23 1242   Weight: 101.5 kg (223 lb 12.8 oz) 101.5 kg (223 lb 12.3 oz) 105.2 kg (232 lb)       Intake/Output Summary:  (Last 24 hours)    Intake/Output Summary (Last 24 hours) at 12/26/2023 1538  Last data filed at 12/26/2023 0909  Gross per 24 hour   Intake 1145 ml   Output 800 ml   Net 345 ml           Physical Exam  Constitutional:       General: He is not in acute distress.     Appearance: Normal appearance.   HENT:      Head: Normocephalic and atraumatic.      Nose: Nose normal.   Eyes:      Conjunctiva/sclera: Conjunctivae normal.   Cardiovascular:      Rate and Rhythm: Normal rate and regular rhythm.      Heart sounds: Normal heart sounds. No murmur heard.  Pulmonary:      Effort: Pulmonary effort is normal. No respiratory distress.      Breath sounds: Normal breath sounds.   Skin:     General: Skin is warm and dry.   Neurological:      Mental Status: He is alert. Mental status is at baseline.              Point of Care Testing  (Last 24 hours)  Glucose: (!) 129 (12/26/23 0405)  POC Glucose (Download): (!) 315 (12/26/23 1128)    Radiology and other Diagnostics Review:    CT NECK W/CONTRAST  Result Date: 12/08/2023  1.  Marked decrease size of the prior large left oropharyngeal mass. There is a residual 1.7 x 1.5 cm lesion involving the left tonsil with central fluid attenuation. Treated necrotic tumor is the leading consideration. A post therapeutic retention cyst could appear similar. An abscess is thought less likely though direct visualization and clinical correlation is recommended regarding symptoms of infection. 2.  Improvement of bilateral cervical lymphadenopathy. By my electronic signature, I attest that I have personally reviewed the images for this examination and formulated the interpretations and opinions expressed in this report  Finalized by Marily Memos, M.D. on 12/08/2023 12:00 PM. Dictated by Jerene Pitch, MD on 12/08/2023 11:07 AM.    CT HEAD WO CONTRAST  Result Date: 12/08/2023  1.  No acute intracranial hemorrhage or mass effect. 2.  Mild patchy cerebral white matter hypodensities, likely sequelae of chronic microvascular ischemia. 3.  The CT neck is dictated separately. Please see that report. By my electronic signature, I attest that I have personally reviewed the images for this examination and formulated the interpretations and opinions expressed in this report  Finalized by Marily Memos, M.D. on 12/08/2023 12:00 PM. Dictated by Jerene Pitch, MD on 12/08/2023 10:51 AM.

## 2023-12-26 NOTE — Case Management (ED)
 Case Management Progress Note    NAME:Daryl Baker                          MRN: [id number]              DOB:[birth year]          AGE: 68 y.o.  ADMISSION DATE: 12/08/2023             DAYS ADMITTED: LOS: 18 days      Today's Date: 12/26/2023    PLAN: Discharge to SNF anticipated.     Expected Discharge Date: 12/29/2023   Is Patient Medically Stable: Yes   Are there Barriers to Discharge? yes (placement)    INTERVENTION/DISPOSITION:  Discharge Planning              Discharge Planning: Inpatient Rehabilitation    SW attended team huddle and reviewed EMR. Pt stable for discharge pending placement.    [facility] accepted and submitted for auth. SW notifed by rehab that insurance offering p2p. P2P completed by rehab team - pt denied for IPR.     SW updated pt. He is agreeable to SNF. He would like to dc to SNF closer to home. SW tasked CMA to deliver in network SNF list to bedside to pt review.     SW updated weekend report for follow up.     Transportation              Does the Patient Need Case Management to Arrange Discharge Transport? (ex: facility, ambulance, wheelchair/stretcher, Medicaid, cab, other): No  Transportation Name, Phone and Availability #1: cameron, schwinn (Spouse)  [phone] (Mobile)  Support              Support: Pt/Family Updates re:POC or DC Plan, Huddle/team update  Info or Referral              Information or Referral to Community Resources: No Needs Identified  Positive SDOH Domains and Potential Barriers   Positive for SDOH Domain:  (No Needs Identified)               Medication Needs              Medication Needs: No Needs Identified                                                                                                                                          Financial              Financial: No Needs Identified  Legal              Legal: No Needs Identified  Other              Other/None: No needs identified  Discharge Disposition Selected Continued Care - Admitted Since 12/08/2023       [facility]  Coordination complete.      Service Provider Services Address Phone Fax Patient Preferred    [facility] [address], [place] [address] [phone] --              [facility] Coordination complete.      Service Provider Services Address Phone Fax Patient Preferred    [facility] [address], [place] [address] [phone] --                      Esau Grew    [facility]

## 2023-12-26 NOTE — Case Management (ED)
 CMA Note:       Delivered an in network SNF list to pt per request from Esau Grew, [facility].     Calla Kicks  Case Management Assistant  For additional assistance please contact SWCM

## 2023-12-26 NOTE — Care Coordination-Inpatient
 Palliative Medicine    Palliative Care does not plan to visit patient today or over the weekend.  If there are any needs during that time, please reach out to our on-call team at [phone]. Dr. Veatrice Kells will be covering this weekend.      Dr. Collie Siad and myself plan to return for follow up on Monday.    Thank you for allowing [place] to be a part of this patient's care.      Daryl Salvador Lyn Hollingshead, APRN-NP  Available by [facility]  Palliative Medicine Division  Team On Call Pager [phone]

## 2023-12-26 NOTE — Case Management (ED)
 Case Management Progress Note    NAME:Daryl Baker                          MRN: [id number]              DOB:02-28-1956          AGE: 68 y.o.  ADMISSION DATE: 12/08/2023             DAYS ADMITTED: LOS: 18 days      Today's Date: 12/26/2023    PLAN: Outpt palliative care clinic appointment / follow up needed.     Expected Discharge Date: 12/27/2023   Is Patient Medically Stable: Yes   Are there Barriers to Discharge? no    INTERVENTION/DISPOSITION:  Discharge Planning              Discharge Planning: Inpatient Rehabilitation  Transportation              Does the Patient Need Case Management to Arrange Discharge Transport? (ex: facility, ambulance, wheelchair/stretcher, Medicaid, cab, other): No  Transportation Name, Phone and Availability #1: javen, hinderliter (Spouse)  [phone] (Mobile)  Support              Support: Pt/Family Updates re:POC or DC Plan, Huddle/team update  Info or Referral              Information or Referral to Community Resources: No Needs Identified   SW completed order for outpt palliative care clinic appointment.    Outpt team will contact Pt and/or family regarding appointment time and date.      Positive SDOH Domains and Potential Barriers   Positive for SDOH Domain:  (No Needs Identified)               Medication Needs              Medication Needs: No Needs Identified                                                   Financial              Financial: No Needs Identified  Legal              Legal: No Needs Identified  Other              Other/None: No needs identified  Discharge Disposition                                                                                                                                                      Selected Continued Care - Admitted Since 12/08/2023       [facility]  Home Care Coordination complete.      Service Provider Services Address Phone Fax Patient Preferred    [facility] [address], [place] [address] [phone] --              [facility] Coordination complete.      Service Provider Services Address Phone Fax Patient Preferred    [facility] [address], [place] [address] [phone] --                  Briscoe Burns, LSCSW  Voalte

## 2023-12-27 ENCOUNTER — Inpatient Hospital Stay: Admit: 2023-12-27 | Discharge: 2023-12-27

## 2023-12-27 LAB — COMPREHENSIVE METABOLIC PANEL
~~LOC~~ BKR ALT: 9 U/L — ABNORMAL HIGH (ref 7–56)
~~LOC~~ BKR ANION GAP: 7 mL/min — ABNORMAL HIGH (ref 3–12)
~~LOC~~ BKR AST: 14 U/L — ABNORMAL LOW (ref 7–40)
~~LOC~~ BKR CO2: 31 mmol/L — ABNORMAL HIGH (ref 21–30)
~~LOC~~ BKR GLOMERULAR FILTRATION RATE (GFR): >60 mL/min (ref >60–2.6)

## 2023-12-27 LAB — POC GLUCOSE
~~LOC~~ BKR POC GLUCOSE: 179 mg/dL — ABNORMAL HIGH (ref 70–100)
~~LOC~~ BKR POC GLUCOSE: 175 mg/dL — ABNORMAL HIGH (ref 70–100)
~~LOC~~ BKR POC GLUCOSE: 199 mg/dL — ABNORMAL HIGH (ref 70–100)
~~LOC~~ BKR POC GLUCOSE: 211 mg/dL — ABNORMAL HIGH (ref 70–100)

## 2023-12-27 LAB — URINALYSIS DIPSTICK REFLEX TO CULTURE
~~LOC~~ BKR LEUKOCYTES: NEGATIVE
~~LOC~~ BKR NITRITE: NEGATIVE
~~LOC~~ BKR PROTEIN,UA: NEGATIVE /LPF
~~LOC~~ BKR URINE BILE: NEGATIVE
~~LOC~~ BKR URINE BLOOD: NEGATIVE
~~LOC~~ BKR URINE KETONE: NEGATIVE
~~LOC~~ BKR URINE PH: 7 /HPF (ref 5.0–8.0)
~~LOC~~ BKR URINE SPEC GRAVITY: 1 /HPF (ref 1.005–1.030)

## 2023-12-27 LAB — LACTIC ACID(LACTATE): ~~LOC~~ BKR LACTIC ACID: 1.8 mmol/L (ref 0.5–2.0)

## 2023-12-27 LAB — MANUAL DIFF: ~~LOC~~ BKR NEUTROPHILS - RELATIVE: 61 % (ref 41–77)

## 2023-12-27 MED ORDER — HYDROMORPHONE (PF) 2 MG/ML IJ SYRG
.5 mg | Freq: Once | INTRAVENOUS | 0 refills | Status: CP
Start: 2023-12-27 — End: ?
  Administered 2023-12-28: 04:00:00 0.5 mg via INTRAVENOUS

## 2023-12-27 MED ORDER — HYDROXYZINE HCL 10 MG/5 ML PO SOLN
25 mg | Freq: Once | GASTROSTOMY | 0 refills | Status: CP
Start: 2023-12-27 — End: ?
  Administered 2023-12-27: 17:00:00 25 mg via GASTROSTOMY

## 2023-12-27 NOTE — Unmapped
 Patient/family declined:  [facility].    Patient/family educated on importance of intervention to their safety/quality of care. Patient/family continues to decline care.    Reason Why Patient/Family declined:  Patient does not desire [facility] at this time.    Individualized safety and/or care plan implemented. If additional safety measures implemented, please list them.     Escalated to:  Unit coordinator/charge nurse.

## 2023-12-27 NOTE — Progress Notes
 General Progress Note    Name:  Daryl Baker     Today's Date:  12/27/2023  Admission Date: 12/08/2023  LOS: 19 days                     Assessment/Plan:    Principal Problem:    Oropharyngeal mass  Active Problems:    Oropharyngeal cancer (CMS-HCC)    Severe malnutrition    Mouth pain    Drug-induced constipation    Encephalopathy acute      68 y.o. 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 He has had increasing pain over the last few days prior to admission near his oropharyngeal mass on the left along with the left aspect of his tongue preventing any PO intake and has a scheduled G-tube placement.    SIRS (3/4 criteria)   -Fevered this AM of 3/29 to 100.8  -tachycardic to 113 at 3 AM  -New leukocytosis, WBC = 11.4  -UA unremarkable  -lactate 1.8  >f/u CXR, blood cultures    Multiple Brain Infarcts  Acute metabolic encephalopathy  -Differential Dx: emboli vs watershed infarcts  -Blood sugar  = normal/ elevated  -Na = wnl, Ca = 8.3  -3/17 venous pH = 7.42  -TSH = 0.39  -Hx of cisplatin 5 cycles, which can cause encephalopathy/PRES  -currently denying headache, has been stooling regularly  -Had a course of thiamine during admission  -EEG: mild encephalopathy  -MRI attempted x2 with ativan but patient was unable to tolerate without anesthesia  -MRI brain: Scattered small acute to subacute bilateral cerebral white matter Infarcts concerning for emboli.  -Lumbar Puncture: elevated protein, cultures NGTD, cytology pending  -Lipid Profile: wnl;  last HbA1c = 7.6 (12/16/23)  -HSV & crypto neg  -TTE: valves not visualized, TEE: ordered  -carotid ultrasound: normal  -HSV & crypto neg  -Cardiac CT without evidence of vegetation  -CTA head: mild stenosis of carotid siphons & vertebral arteries  Plan:  > Daily thiamine supplementation  > Scheduled melatonin & trazodone 50mg , continue delirium precautions  > Neurology consulted- ASA 81, optimize BP & diabetes, not consistent with PRES despite cisplatin, likely watershed infarct    Neutropenic fever- resolved  Presumed Aspiration pneumonia- resolved  Sepsis- resolved  -tachycardia, fever, increase in WBC (leukopenic at baseline)  -source likely pulmonary given imaging findings  -UA unremarkable  -CTA Chest: Development of a few clustered nodular and patchy opacities within the   right greater than left lower lobes, probably infectious or aspiration-related.   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  Plan:  > Trulicity nonformulary, will hold inpatient  > glyburide 2.5 mg daily & metformin 500 BID for hyperglycemia via G-tube    Dysphagia   Oropharyngeal pain  -Due to left oropharyngeal cancer  -G-tube placed 3/12 AM  -He will need the G-tube feeds for >90 days.  Plan:  >Magic mouthwash TID PRN for oropharyngeal pain  > Consulted Dietician  >Transitioned to tube feeds 3/15, formal recs in dietician note  > Palliative Care consult for pain management as above.   -  Fentanyl patch 29mcg/hr  -Oxycodone 15mg  q4h PRN  -Gabapentin 300mg  TID  -Bowel regimen with senokot-s 2ts BID, miralax BID  >Fluconazole 400mg  qday for oral candidiasis until 4/4     Left HPV-Mediated Oropharyngeal SCC  -Currently on chemo and radiation  -Follow in Oncology clinic with dr Neita Carp and radiation Oncology Dr Mariane Masters  -CT Neck revealed:  1.  Marked decrease size of the prior large left oropharyngeal mass. There   is a residual 1.7 x 1.5 cm lesion involving the left tonsil with central   fluid attenuation. Treated necrotic tumor is the leading consideration. A   post therapeutic retention cyst could appear similar. An abscess is   thought less likely though direct visualization and clinical correlation   is recommended regarding symptoms of infection.   2.  Improvement of bilateral cervical lymphadenopathy.   -Radiation Oncology finished radiation while inpatient.(3/17-3/19)  Plan:  > Triamcinolone cream for radiation induced dermatitis    Pancytopenia   Neutropenia  -ANC <1  -likely due to Chemo & radiation  -follow with daily CBC w/ Diff     Hypertension  >Continue to hold metoprolol & losartan    FEN:  Bolus tube feeds with free water flushes/Replace electrolytes as needed/Tube feeds  DIET CLEAR LIQUID w/ Body mass index is 32.36 kg/m?Marland Kitchen   DVT PPX:  40mg  qday enoxaparin, SCDs bilat  Code status: Full Code     Patient seen and case discussed with Dr. Franco Nones, MD   Internal Medicine-Psychiatry Resident, PGY1  Available on Voalte   ________________________________________________________________________    Subjective  Patient seen and examined at bedside this morning. He denies complaints this AM & denies chills/malaise, diarrhea, urinary symptoms, or pain at the G-tube site, Despite a fever, new leukocytosis, and tachycardia this AM. He does have a cough which he reports is unchanged.     Medications  Scheduled Meds:aspirin chewable tablet 81 mg, 81 mg, PEG Tube, QDAY  Diet Enteral Feeding Bolus, , SEE ADMIN INSTRUCTIONS, 5XDAY (6,10,14,18,22   And  Water Bolus, 100 mL, PEG Tube, 5XDAY (6,10,14,18,22  enoxaparin (LOVENOX) syringe 40 mg, 40 mg, Subcutaneous, QDAY(21)  fentaNYL (DURAGESIC) 50 mcg/hr patch 1 patch, 1 patch, Transdermal, Q72H*   And  Verification of Patch Placement and Integrity - fentaNYL 50 mcg/hr, , Transdermal, BID  fluconazole (DIFLUCAN) oral suspension 200 mg, 200 mg, Oral, QDAY  gabapentin (NEURONTIN) capsule 300 mg, 300 mg, Per G Tube, TID  glyBURIDE (DIABETA) tablet 2.5 mg, 2.5 mg, Oral, QDAY  melatonin tablet 10 mg, 10 mg, Oral, QHS  metFORMIN (GLUCOPHAGE) tablet 500 mg, 500 mg, Oral, BID w/meals  polyethylene glycol 3350 (MIRALAX) packet 17 g, 1 packet, Per G Tube, QDAY  sennosides-docusate sodium (SENOKOT-S) tablet 2 tablet, 2 tablet, Per G Tube, QDAY  thiamine mononitrate (vit B1) tablet 100 mg, 100 mg, PEG Tube, QDAY  traZODone (DESYREL) tablet 75 mg, 75 mg, Oral, QHS  triamcinolone acetonide (KENALOG) 0.1 % topical cream 1 g, 1 g, Topical, BID    Continuous Infusions:      PRN and Respiratory Meds:acetaminophen Q6H PRN, bisacodyL QDAY PRN, dextrose 50% (D50) IV PRN, diphenhydrAMINE/lidocaine/mag,al-simeth(#) TID PRN, ondansetron Q6H PRN **OR** ondansetron (ZOFRAN) IV Q6H PRN, oxyCODONE Q3H PRN, pancrelipase 20,880 Units/sodium bicarbonate 650 mg ([place] CLOG DESTROYER) PRN (On Call from Rx), phenoL PRN, sodium chloride PRN        Objective:  Vital Signs: Last Filed                 Vital Signs: 24 Hour Range   BP: 157/72 (03/29 0320)  Temp: 38.2 ?C (100.8 ?F) (03/29 0320)  Pulse: 85 (03/29 0400)  Respirations: 20 PER MINUTE (03/29 0320)  SpO2: 98 % (03/29 0320)  O2 Device: None (Room air) (03/29 0320) BP: (113-157)/(62-79)   Temp:  [36.2 ?C (97.1 ?F)-38.2 ?C (100.8 ?F)]   Pulse:  [85-128]   Respirations:  [16 PER MINUTE-20 PER MINUTE]   SpO2:  [96 %-98 %]   O2 Device: None (Room air)   Intensity Pain Scale (Self Report): 8 (12/27/23 0626) Vitals:    12/09/23 1715 12/23/23 0752 12/25/23 1242   Weight: 101.5 kg (223 lb 12.8 oz) 101.5 kg (223 lb 12.3 oz) 105.2 kg (232 lb)       Intake/Output Summary:  (Last 24 hours)    Intake/Output Summary (Last 24 hours) at 12/27/2023 0745  Last data filed at 12/27/2023 1610  Gross per 24 hour   Intake 1975 ml   Output 1120 ml   Net 855 ml           Physical Exam  Constitutional:       General: He is not in acute distress.     Appearance: Normal appearance.   HENT:      Head: Normocephalic and atraumatic.      Nose: Nose normal.   Eyes:      Conjunctiva/sclera: Conjunctivae normal.   Cardiovascular:      Rate and Rhythm: Normal rate and regular rhythm.      Heart sounds: Normal heart sounds. No murmur heard.  Pulmonary:      Effort: Pulmonary effort is normal. No respiratory distress.      Breath sounds: Examination of the left-lower field reveals rales. Rales present.   Skin:     General: Skin is warm and dry.   Neurological:      Mental Status: He is alert. Mental status is at baseline.              Point of Care Testing  (Last 24 hours)  Glucose: (!) 107 (12/27/23 0335)  POC Glucose (Download): (!) 179 (12/26/23 2111)    Radiology and other Diagnostics Review:    CT NECK W/CONTRAST  Result Date: 12/08/2023  1.  Marked decrease size of the prior large left oropharyngeal mass. There is a residual 1.7 x 1.5 cm lesion involving the left tonsil with central fluid attenuation. Treated necrotic tumor is the leading consideration. A post therapeutic retention cyst could appear similar. An abscess is thought less likely though direct visualization and clinical correlation is recommended regarding symptoms of infection. 2.  Improvement of bilateral cervical lymphadenopathy. By my electronic signature, I attest that I have personally reviewed the images for this examination and formulated the interpretations and opinions expressed in this report  Finalized by Marily Memos, M.D. on 12/08/2023 12:00 PM. Dictated by Jerene Pitch, MD on 12/08/2023 11:07 AM.    CT HEAD WO CONTRAST  Result Date: 12/08/2023  1.  No acute intracranial hemorrhage or mass effect. 2.  Mild patchy cerebral white matter hypodensities, likely sequelae of chronic microvascular ischemia. 3.  The CT neck is dictated separately. Please see that report. By my electronic signature, I attest that I have personally reviewed the images for this examination and formulated the interpretations and opinions expressed in this report  Finalized by Marily Memos, M.D. on 12/08/2023  12:00 PM. Dictated by Jerene Pitch, MD on 12/08/2023 10:51 AM.

## 2023-12-27 NOTE — Case Management (ED)
 Case Management Progress Note    NAME:Daryl Baker                          MRN: [id number]              DOB:[birth year]          AGE: 68 y.o.  ADMISSION DATE: 12/08/2023             DAYS ADMITTED: LOS: 19 days      Today's Date: 12/27/2023    PLAN: Discharge to SNF    Expected Discharge Date: 12/29/2023   Is Patient Medically Stable: Yes   Are there Barriers to Discharge? no    INTERVENTION/DISPOSITION:  Met with pt at bedside to discuss SNF choices  Pt appeared confused and asked SW to f/u with his wife  SW called pt's wife and she confirmed pt has been confused and she will be making decisions regarding discharge plan  Wife requested SNF list for [place] area be emailed to pt's email address at [email or web address] for her reivew  SW emailed list      Discharge Planning              Discharge Planning: Inpatient Rehabilitation  Transportation              Does the Patient Need Case Management to Arrange Discharge Transport? (ex: facility, ambulance, wheelchair/stretcher, Medicaid, cab, other): No  Transportation Name, Phone and Availability #1: jordyn, doane (Spouse)  [phone] (Mobile)  Support              Support: Pt/Family Updates re:POC or DC Plan, Huddle/team update  Info or Referral              Information or Referral to Community Resources: No Needs Identified  Positive SDOH Domains and Potential Barriers   Positive for SDOH Domain:  (No Needs Identified)               Medication Needs              Medication Needs: No Needs Identified                                                                                                                                          Financial              Financial: No Needs Identified  Legal              Legal: No Needs Identified  Other              Other/None: No needs identified  Discharge Disposition  Selected Continued Care - Admitted Since 12/08/2023       [facility] Coordination complete.      Service Provider Services Address Phone Fax Patient Preferred    [facility] [address], [place] [address] [phone] --              [facility] Coordination complete.      Service Provider Services Address Phone Fax Patient Preferred    [facility] [address], [place] [address] [phone] --                      [name]  Available on [facility]  [phone]

## 2023-12-28 LAB — MANUAL DIFF
~~LOC~~ BKR BANDS - RELATIVE: 5 % (ref 0–10)
~~LOC~~ BKR LYMPHOCYTES - RELATIVE: 9 % — ABNORMAL LOW (ref 24–44)
~~LOC~~ BKR METAMYELOCYTES - RELATIVE: 2 %
~~LOC~~ BKR MONOCYTES - RELATIVE: 16 % — ABNORMAL HIGH (ref 4–12)
~~LOC~~ BKR MYELOCYTES - RELATIVE: 4 % — ABNORMAL LOW (ref 28–56)
~~LOC~~ BKR NEUT+BANDS - ABSOLUTE: 9.1 10*3/uL — ABNORMAL HIGH (ref 1.8–7.0)
~~LOC~~ BKR NEUTROPHILS - RELATIVE: 64 % (ref 41–77)

## 2023-12-28 LAB — RVP VIRAL PANEL PCR: ~~LOC~~ BKR SARS-COV-2 RESP PANEL: DETECTED — AB

## 2023-12-28 LAB — COMPREHENSIVE METABOLIC PANEL
~~LOC~~ BKR ANION GAP: 9 % — ABNORMAL HIGH (ref 3–12)
~~LOC~~ BKR GLOMERULAR FILTRATION RATE (GFR): >60 mL/min — ABNORMAL HIGH (ref >60–400)

## 2023-12-28 LAB — POC GLUCOSE
~~LOC~~ BKR POC GLUCOSE: 168 mg/dL — ABNORMAL HIGH (ref 70–100)
~~LOC~~ BKR POC GLUCOSE: 161 mg/dL — ABNORMAL HIGH (ref 70–100)

## 2023-12-28 MED ORDER — REMDESIVIR 100MG IVPB (MB+)
100 mg | INTRAVENOUS | 0 refills | Status: CP
Start: 2023-12-28 — End: ?
  Administered 2023-12-29 – 2024-01-01 (×8): 100 mg via INTRAVENOUS

## 2023-12-28 MED ORDER — REMDESIVIR IVPB (LOADING DOSE)
200 mg | Freq: Once | INTRAVENOUS | 0 refills | Status: CP
Start: 2023-12-28 — End: ?
  Administered 2023-12-28 (×2): 200 mg via INTRAVENOUS

## 2023-12-28 MED ORDER — GUAIFENESIN 100 MG/5 ML PO LIQD
200 mg | GASTROSTOMY | 0 refills | Status: DC | PRN
Start: 2023-12-28 — End: 2024-01-02

## 2023-12-28 MED ORDER — ROSUVASTATIN 20 MG PO TAB
20 mg | Freq: Every evening | ORAL | 0 refills | Status: DC
Start: 2023-12-28 — End: 2024-01-02
  Administered 2023-12-29 – 2024-01-02 (×5): 20 mg via ORAL

## 2023-12-28 MED ORDER — TRIAMCINOLONE ACETONIDE 0.1 % TP CREA
1 g | Freq: Every day | TOPICAL | 0 refills | Status: DC
Start: 2023-12-28 — End: 2024-01-02
  Administered 2023-12-28 – 2023-12-31 (×2): 1 g via TOPICAL

## 2023-12-28 NOTE — Progress Notes
 RT Adult Assessment Note    NAME:Daryl Baker             MRN: [id number]             DOB:[birth year]          AGE: 68 y.o.  ADMISSION DATE: 12/08/2023             DAYS ADMITTED: LOS: 20 days    Additional Comments:  Impressions of the patient: Patient sitting up in bed on RA, NAD  Intervention(s)/outcome(s): Started OPEP QID; no complications  Patient education that was completed: Instructed use on OPEP    Vital Signs:  Pulse: 106  RR: 20 PER MINUTE  SpO2: 97 %  O2 Device: None (Room air)  Liter Flow:    O2%:      Breath Sounds:   Breath Sounds WDL: Within Defined Limits  Respiratory Effort:   Respiratory WDL: Within Defined Limits  Comments:

## 2023-12-28 NOTE — Progress Notes
 General Progress Note    Name:  Daryl Baker     Today's Date:  12/28/2023  Admission Date: 12/08/2023  LOS: 20 days                     Assessment/Plan:    Principal Problem:    Oropharyngeal mass  Active Problems:    Oropharyngeal cancer (CMS-HCC)    Severe malnutrition    Mouth pain    Drug-induced constipation    Encephalopathy acute      68 y.o. 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 He has had increasing pain over the last few days prior to admission near his oropharyngeal mass on the left along with the left aspect of his tongue preventing any PO intake and has a scheduled G-tube placement.      SARS-COV-2 URI  -Fevered AM of 3/29 to 100.8  -tachycardic to 113 at 3 AM  -Uptrending leukocytosis, WBC = 13  -nasal congestion & cough  -UA unremarkable  -lactate 1.8  -CXR unremarkable  -RVP: COVID +   > blood cultures pending  > Start IV Remdesivir     Multiple Brain Infarcts  Acute metabolic encephalopathy  -Differential Dx: emboli vs watershed infarcts  -Blood sugar  = normal/ elevated  -Na = wnl, Ca = 8.3  -3/17 venous pH = 7.42  -TSH = 0.39  -Hx of cisplatin 5 cycles, which can cause encephalopathy/PRES  -currently denying headache, has been stooling regularly  -Had a course of thiamine during admission  -EEG: mild encephalopathy  -MRI attempted x2 with ativan but patient was unable to tolerate without anesthesia  -MRI brain: Scattered small acute to subacute bilateral cerebral white matter Infarcts concerning for emboli.  -Lumbar Puncture: elevated protein, cultures NGTD, cytology pending  -Lipid Profile: wnl;  last HbA1c = 7.6 (12/16/23)  -HSV & crypto neg  -TTE: valves not visualized, TEE: ordered  -carotid ultrasound: normal  -HSV & crypto neg  -Cardiac CT without evidence of vegetation  -CTA head: mild stenosis of carotid siphons & vertebral arteries  Plan:  > Daily thiamine supplementation  > Scheduled melatonin & trazodone 50mg , continue delirium precautions  > Neurology consulted- ASA 81, optimize BP & diabetes, not consistent with PRES despite cisplatin, likely watershed infarct    Neutropenic fever- resolved  Presumed Aspiration pneumonia- resolved  Sepsis- resolved  -tachycardia, fever, increase in WBC (leukopenic at baseline)  -source likely pulmonary given imaging findings  -UA unremarkable  -CTA Chest: Development of a few clustered nodular and patchy opacities within the   right greater than left lower lobes, probably infectious or aspiration-related.   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  Plan:  > Trulicity nonformulary, will hold inpatient  > glyburide 2.5 mg daily & metformin 500 BID for hyperglycemia via G-tube    Dysphagia   Oropharyngeal pain  -Due to left oropharyngeal cancer  -G-tube placed 3/12 AM  -He will need the G-tube feeds for >90 days.  Plan:  >Magic mouthwash TID PRN for oropharyngeal pain  > Consulted Dietician  >Transitioned to  tube feeds 3/15, formal recs in dietician note  > Palliative Care consult for pain management as above.   -Fentanyl patch 8mcg/hr  -Oxycodone 15mg  q4h PRN  -Gabapentin 300mg  TID  -Bowel regimen with senokot-s 2ts BID, miralax BID  >Fluconazole 400mg  qday for oral candidiasis until 4/4     Left HPV-Mediated Oropharyngeal SCC  -Currently on chemo and radiation  -Follow in Oncology clinic with dr Neita Carp and radiation Oncology Dr Mariane Masters  -CT Neck revealed:  1.  Marked decrease size of the prior large left oropharyngeal mass. There   is a residual 1.7 x 1.5 cm lesion involving the left tonsil with central   fluid attenuation. Treated necrotic tumor is the leading consideration. A   post therapeutic retention cyst could appear similar. An abscess is   thought less likely though direct visualization and clinical correlation   is recommended regarding symptoms of infection.   2.  Improvement of bilateral cervical lymphadenopathy.   -Radiation Oncology finished radiation while inpatient.(3/17-3/19)  Plan:  > Triamcinolone cream for radiation induced dermatitis    Pancytopenia   Neutropenia  -ANC <1  -likely due to Chemo & radiation  -follow with daily CBC w/ Diff     Hypertension  >Continue to hold metoprolol & losartan    FEN:  Bolus tube feeds with free water flushes/Replace electrolytes as needed/Tube feeds  DIET CLEAR LIQUID w/ Body mass index is 32.36 kg/m?Marland Kitchen   DVT PPX:  40mg  qday enoxaparin, SCDs bilat  Code status: Full Code     Patient seen and case discussed with Dr. Franco Nones, MD   Internal Medicine-Psychiatry Resident, PGY1  Available on Voalte     ATTESTATION    I personally performed the key portions of the E/M visit, discussed case with Dr Hilma Favors and the Inpatient Medical Oncology team and concur with her documentation of history, physical exam, assessment, and treatment plan unless otherwise noted in blue script.     -RVP positive for Covid-19, start remdesivir therapy, no indication for steroids and do usual symptomatic treatments  -Work on placement options    Durenda Hurt, M.D.  Internal Medicine, Hospitalist Date:  12/28/2023       ________________________________________________________________________    Subjective  Patient seen and examined at bedside this morning. He is coughing & feels ill today. He was found to be COVID-19 +.        Medications  Scheduled Meds:aspirin chewable tablet 81 mg, 81 mg, PEG Tube, QDAY  Diet Enteral Feeding Bolus, , SEE ADMIN INSTRUCTIONS, 5XDAY (6,10,14,18,22   And  Water Bolus, 100 mL, PEG Tube, 5XDAY (6,10,14,18,22  enoxaparin (LOVENOX) syringe 40 mg, 40 mg, Subcutaneous, QDAY(21)  fentaNYL (DURAGESIC) 50 mcg/hr patch 1 patch, 1 patch, Transdermal, Q72H*   And  Verification of Patch Placement and Integrity - fentaNYL 50 mcg/hr, , Transdermal, BID  fluconazole (DIFLUCAN) oral suspension 200 mg, 200 mg, Oral, QDAY  gabapentin (NEURONTIN) capsule 300 mg, 300 mg, Per G Tube, TID  glyBURIDE (DIABETA) tablet 2.5 mg, 2.5 mg, Oral, QDAY  melatonin tablet 10 mg, 10 mg, Oral, QHS  metFORMIN (GLUCOPHAGE) tablet 500 mg, 500 mg, Oral, BID w/meals  polyethylene glycol 3350 (MIRALAX) packet 17 g, 1 packet, Per G Tube, QDAY  sennosides-docusate sodium (SENOKOT-S) tablet 2 tablet, 2 tablet, Per G Tube, QDAY  thiamine mononitrate (vit B1) tablet 100 mg, 100 mg, PEG Tube, QDAY  traZODone (DESYREL) tablet 75 mg, 75 mg, Oral, QHS  triamcinolone acetonide (KENALOG) 0.1 %  topical cream 1 g, 1 g, Topical, BID    Continuous Infusions:      PRN and Respiratory Meds:acetaminophen Q6H PRN, bisacodyL QDAY PRN, dextrose 50% (D50) IV PRN, diphenhydrAMINE/lidocaine/mag,al-simeth(#) TID PRN, ondansetron Q6H PRN **OR** ondansetron (ZOFRAN) IV Q6H PRN, oxyCODONE Q3H PRN, pancrelipase 20,880 Units/sodium bicarbonate 650 mg ([place] CLOG DESTROYER) PRN (On Call from Rx), phenoL PRN, sodium chloride PRN        Objective:                          Vital Signs: Last Filed                 Vital Signs: 24 Hour Range   BP: 128/59 (03/30 0245)  Temp: 36.3 ?C (97.4 ?F) (03/30 0245)  Pulse: 96 (03/30 0245)  Respirations: 19 PER MINUTE (03/30 0245)  SpO2: 93 % (03/30 0245)  O2 Device: None (Room air) (03/30 0245) BP: (123-141)/(56-71)   Temp:  [36.3 ?C (97.4 ?F)-37.6 ?C (99.7 ?F)]   Pulse:  [96-116]   Respirations:  [19 PER MINUTE-21 PER MINUTE]   SpO2:  [93 %-97 %]   O2 Device: None (Room air)   Intensity Pain Scale (Self Report): 10 (12/27/23 2136) Vitals:    12/09/23 1715 12/23/23 0752 12/25/23 1242   Weight: 101.5 kg (223 lb 12.8 oz) 101.5 kg (223 lb 12.3 oz) 105.2 kg (232 lb)       Intake/Output Summary:  (Last 24 hours)    Intake/Output Summary (Last 24 hours) at 12/28/2023 0715  Last data filed at 12/28/2023 0054  Gross per 24 hour   Intake 1960 ml   Output 775 ml   Net 1185 ml           Physical Exam  Constitutional:       General: He is not in acute distress.     Appearance: Normal appearance.   HENT:      Head: Normocephalic and atraumatic.      Nose: Nose normal.   Eyes:      Conjunctiva/sclera: Conjunctivae normal.   Cardiovascular:      Rate and Rhythm: Normal rate and regular rhythm.      Heart sounds: Normal heart sounds. No murmur heard.  Pulmonary:      Effort: Pulmonary effort is normal. No respiratory distress.      Breath sounds: Examination of the left-lower field reveals rales. Rales present.   Skin:     General: Skin is warm and dry.   Neurological:      Mental Status: He is alert. Mental status is at baseline.            Point of Care Testing  (Last 24 hours)  Glucose: (!) 149 (12/28/23 0243)  POC Glucose (Download): (!) 168 (12/27/23 2110)    Radiology and other Diagnostics Review:    CT NECK W/CONTRAST  Result Date: 12/08/2023  1.  Marked decrease size of the prior large left oropharyngeal mass. There is a residual 1.7 x 1.5 cm lesion involving the left tonsil with central fluid attenuation. Treated necrotic tumor is the leading consideration. A post therapeutic retention cyst could appear similar. An abscess is thought less likely though direct visualization and clinical correlation is recommended regarding symptoms of infection. 2.  Improvement of bilateral cervical lymphadenopathy. By my electronic signature, I attest that I have personally reviewed the images for this examination and formulated the interpretations and opinions expressed in this report  Finalized by  Marily Memos, M.D. on 12/08/2023 12:00 PM. Dictated by Jerene Pitch, MD on 12/08/2023 11:07 AM.    CT HEAD WO CONTRAST  Result Date: 12/08/2023  1.  No acute intracranial hemorrhage or mass effect. 2.  Mild patchy cerebral white matter hypodensities, likely sequelae of chronic microvascular ischemia. 3.  The CT neck is dictated separately. Please see that report. By my electronic signature, I attest that I have personally reviewed the images for this examination and formulated the interpretations and opinions expressed in this report  Finalized by Marily Memos, M.D. on 12/08/2023 12:00 PM. Dictated by Jerene Pitch, MD on 12/08/2023 10:51 AM.

## 2023-12-29 LAB — MANUAL DIFF

## 2023-12-29 LAB — POC GLUCOSE
~~LOC~~ BKR POC GLUCOSE: 103 mg/dL — ABNORMAL HIGH (ref 70–100)
~~LOC~~ BKR POC GLUCOSE: 137 mg/dL — ABNORMAL HIGH (ref 70–100)
~~LOC~~ BKR POC GLUCOSE: 233 mg/dL — ABNORMAL HIGH (ref 70–100)
~~LOC~~ BKR POC GLUCOSE: 88 mg/dL (ref 70–100)

## 2023-12-29 MED ORDER — IPRATROPIUM-ALBUTEROL 0.5 MG-3 MG(2.5 MG BASE)/3 ML IN NEBU
3 mL | RESPIRATORY_TRACT | 0 refills | Status: DC | PRN
Start: 2023-12-29 — End: 2023-12-29
  Administered 2023-12-29: 21:00:00 3 mL via RESPIRATORY_TRACT

## 2023-12-29 MED ORDER — OXYMETAZOLINE 0.05 % NA SPRY
2 | Freq: Two times a day (BID) | NASAL | 0 refills | Status: AC | PRN
Start: 2023-12-29 — End: ?
  Administered 2023-12-29: 16:00:00 2 via NASAL

## 2023-12-29 MED ORDER — IPRATROPIUM-ALBUTEROL 0.5 MG-3 MG(2.5 MG BASE)/3 ML IN NEBU
3 mL | RESPIRATORY_TRACT | 0 refills | Status: DC | PRN
Start: 2023-12-29 — End: 2023-12-30
  Administered 2023-12-30: 02:00:00 3 mL via RESPIRATORY_TRACT

## 2023-12-29 NOTE — Progress Notes
 Float Pool END OF SHIFT/ JHFRAT NOTE    Admission Date: 12/08/2023    Acute events, interventions, provider communication: No acute events this shift. Pain managed with PRNs.     Patient Interventions and Education  Fall Risk/JHFRAT Interventions and Education: (Charting when applicable)  Elimination Interventions : Place male/male urinal within reach   Medications : Stay within arm's reach during toileting/showering (i.e., dizziness, orthostasis)  and Bed/chair alarm (i.e., change in mental status)   Patient Care Equipment: Ensure environment is free of clutter and walkways are clear from tripping hazards  Mobility: Assist x1 and Utilize walker, cane, or additional walking aid for ambulation  Cognition: Bed/Chair Alarm  and Stay within arm's reach while patient ambulating/toileting/showering  Risk for Moderate/Major Injury: Age: >65 yrs and Active Anticoagulation    2. Restraints:  No     Restraint Goal: Patient will be free from injury while physically restrained.  See Docflowsheet for restraint documentation, interventions, education, etc.    Intake and Output:       Intake/Output Summary (Last 24 hours) at 12/29/2023 1849  Last data filed at 12/29/2023 0816  Gross per 24 hour   Intake 467 ml   Output --   Net 467 ml              Last Bowel Movement Date: 12/29/23

## 2023-12-29 NOTE — Progress Notes
 SPEECH-LANGUAGE PATHOLOGY  CLINICAL SWALLOW ASSESSMENT     Name: Daryl Baker   MRN: [id number]     DOB: Jan 22, 1956      Age: 68 y.o.  Admission Date: 12/08/2023     LOS: 21 days     Date of Service: 12/29/2023        Evaluation Summary   Clinical swallow evaluation completed.   Clinical Impression: Moderate dysphagia  Sources of Dysphagia:  Oropharyngeal cancer (Left HPV oropharyngeal cancer), Hx of chemotherapy and radiation     Clinical swallow evaluation completed this day; however, PO intake limited given odynophagia. Pt completed trials of clear liquids only this day and politely declines additional PO trials (puree and additional liquids). Pt known to this department with outpatient videoswallow evaluation and outpatient follow up x1. See below for additional information. This department will continue to follow.    Swallow Recommendations  PO: Clear liquids  NPO: G-tube  Medications: Non-oral source  Ice Chip Trials: 1 at a time  Supervision: No supervision needed  Positioning: Upright as tolerated  Swallow Strategies: Small bites/sips, Slow rate of intake, Multiple swallows  Oral Hygiene: Complete oral care to minimize the risk of aspirating oral bacteria        PO Presentation  Presentations: Patient Fed Self  Thin Liquid: 1 Tsp, 1 oz, Cup    Clinical Interpretation of Oral Stage  Withdraw Bolus: WFL  Form Bolus: WFL  Masticate Bolus: Did not trial masticated solids due to safety concerns (Comment)  Transfer Bolus: WFL  Anterior Bolus Spillage: None  Oral Residue: None  Oral Stage Summary: Pt with trials of thin liquids only. He notes pain when he swallows and declines PO trials beyond thin liquids.    Clinical Interpretation of Pharyngeal Stage  Swallow Initiation: Timely  Laryngeal Elevation: Suspected to be adequate (present)  Signs / Symptoms Of Aspiration: Thin liquid  Thin Liquid: Throat clear  Suspected Pharyngeal Stage Impairment: Decreased laryngeal vestibule closure, Incomplete pharyngeal clearance, Mistimed airway protection  Pharyngeal Stage Summary: Observed throat clear x1 on thin liquid trials. Small sips and slow rate of intake appeared to be effective. Limited PO trials 2/2 to patient preference.    Oral Mech Exam  Oral Mech WFL: No  Lips: WFL  Tongue: WFL, Other (Comment) (Pt compains of pain with lingual protrusion/ROM)  Vocal Quality: WFL  Volitional Cough: WFL  Throat Clear: WFL  Dentition: Missing Dentition  Oral Mech Exam Summary: Per patient, he has few teeth remaining.    Attempted Swallow Strategies  Small Bites/Sips: Effective  Slow Rate of Intake: Effective    Subjective  Reason for Admission and Past Medical Hx: 68 y.o. 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 intake.  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 intake.  Mental / Cognitive: Alert, Oriented, Cooperative, Follows commands, Tangential  Pain: No complaint of pain  Pain Location: Throat (when speaking)    Pertinent dysphagia hx: Pt seen as outpatient by this department with completion of a videoswallow evaluation in 11/2023. The videoswallow evaluation revealed minimal oropharyngeal dysphagia. Mild penetration observed during the swallow w/ thin liquids consistently, though appeared to eject from laryngeal vestibule upon completion of the swallow. No  aspiration witnessed. Pharyngeal residue was trace and cleared spontaneously. Pt was recommended to continue regular diet/thin liquids. He completed follow-up x1 with outpatient      Home Living Situation  Lives With: Spouse/significant other  Type of Home: House    Prior Level of Function  Level Of Independence: Independent with ADL and community mobility with device      Imaging  CTA HEAD WO/W CONT (12/27/2023)  IMPRESSION       1. 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   2.  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   3.  Patent intracranial anterior, middle, and posterior cerebral arterial   vasculature without large vessel occlusion or high-grade central arterial   stenosis.   4. Scattered small acute/subacute bilateral cerebral white matter   infarcts (predominantly involving the bilateral corona radiata, centrum   ovale and periatrial regions) are better evaluated on prior MRI.   5.  Mild patchy supratentorial white matter hypodensities, likely chronic   microvascular ischemic change.     CHEST SINGLE VIEW (12/27/2023)  IMPRESSION     No acute cardiopulmonary abnormality.       Nutrition  Nutrition Prior To Hospitalization: Oral  Current Form Of Nutrition: PEG (Clear liquid diet)    Education  Persons Educated: Patient  Barriers To Learning: None Noted  Teaching Methods: Verbal  Topics: Dysphagia  Patient Response: Verbalized Understanding  Goal Formulation: With Patient    Assessment/Prognosis  Plan: 2-3 x/week  Prognosis: Fair  NOMS Dysphagia Rating: 3-Moderate Dysphagia -Alternative method of feeding needed. Pt takes < 50% of nutrition/hydration by mouth &/or swallow safe w/ consistent mod cueing to use compensatory strategies &/or requires max diet restriction.    Clinical Swallow Goals  Goal : Pt will tolerate clear liquid diet with <10% s/s of aspiration and no negative changes in pulmonary status.  Goal : Pt will participate in ongoing swallow assessment to advance diet as tolerated by patient.    Speech Discharge Recommendations  Recommendation: Inpatient setting    Therapist:Lewanna Petrak Tressie Ellis, M.S. CCC-SLP  Date:12/29/2023

## 2023-12-29 NOTE — Progress Notes
 PALLIATIVE CARE INPATIENT NOTE     Name: Daryl Baker            MRN: [id number]                DOB: 1956/03/11          Age: 68 y.o.  Admission Date: 12/08/2023             LOS: 21 days    ASSESSMENT/PLAN   Delno Blaisdell is a 68 y.o. 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 intake. The palliative care team was consulted for assistance with symptom management for radiation induced mucositis.    # Pain, secondary to malignancy  # Oropharyngeal carcinoma, left, HPV mediated  # Dysphagia  # Poor p.o. intake    Discussion: Focused on symptoms    RECOMMENDATIONS:   Continue fentanyl patch to 50 mcg/h  Continue Oxycodone 15 mg Q3H PRN  Continue gabapentin 300 mg 3 times daily  Continue senna 2 tab BID, continue miralax 1 packet twice daily.   Have placed outpatient referral for palliative care  I do not anticipate any further changes to his pain regimen here in the hospital.  He is likely experiencing postradiation pain that will improve over time.  Will follow-up in outpatient clinic to determine his opioid requirements and hopefully be able to wean down    Collie Siad, MD  Palliative Attending  Available on Voalte      PALLIATIVE CARE PLANNING     Advance Care Planning:   Identified Health Care Decision Maker:    DPOA - Would benefit from completion of DPOA  TPOPP - Not discussed    PC Clinic - Pending referral to outpatient Palliative Care Clinic    Medication safety - Advise a Narcan prescription on discharge    Disposition planning: IPR    SUBJECTIVE     CC/Reason for Visit: Symptoms (pain)    Interval HPI: Patient is having a productive cough with sputum.  He is also having pain in his mouth and throat that he feels the oxycodone is helping with.      OBJECTIVE     Blood pressure 117/51, pulse 98, temperature 36.6 ?C (97.8 ?F), height 180.3 cm (5' 11), weight 105.2 kg (232 lb), SpO2 96%.  Physical Exam  Constitutional:       General: He is not in acute distress.     Appearance: He is ill-appearing.   HENT:      Mouth/Throat:      Comments: Coughing up clear mucus  Pulmonary:      Effort: No respiratory distress.   Neurological:      Mental Status: He is alert and oriented to person, place, and time.   Psychiatric:         Mood and Affect: Mood normal.         Behavior: Behavior normal.       Lab Results:  CBC   Lab Results   Component Value Date/Time    WBC 14.40 (H) 12/29/2023 03:33 AM    HGB 8.7 (L) 12/29/2023 03:33 AM    PLTCT 418 (H) 12/29/2023 03:33 AM     Lab Results   Component Value Date/Time    NEUT 60.0 12/18/2023 05:59 AM    ANC 7.8 (H) 12/29/2023 03:33 AM  Chemistries   Lab Results   Component Value Date/Time    NA 136 (L) 12/29/2023 03:33 AM    K 4.7 12/29/2023 03:33 AM    BUN 14 12/29/2023 03:33 AM    CR 0.61 12/29/2023 03:33 AM    GLU 68 (L) 12/29/2023 03:33 AM     Lab Results   Component Value Date/Time    CA 8.1 (L) 12/29/2023 03:33 AM    PO4 3.9 12/15/2023 04:27 AM    ALBUMIN 3.0 (L) 12/29/2023 03:33 AM    TOTPROT 5.9 (L) 12/29/2023 03:33 AM    ALKPHOS 75 12/29/2023 03:33 AM    AST 20 12/29/2023 03:33 AM    ALT 10 12/29/2023 03:33 AM    TOTBILI 0.3 12/29/2023 03:33 AM    GFR >60 12/29/2023 03:33 AM        Other Pertinent Diagnostic Results:     MRI 12/22/23  1.  Scattered small acute to subacute bilateral cerebral white matter   infarcts. No significant intracranial mass effect.   2.  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     Moderate medical decision making due to the following:  1 or more chronic illnesses with exacerbation, progression, or side effects of treatment

## 2023-12-29 NOTE — Progress Notes
 Inpatient Medical Oncology Progress Note    Name:  Daryl Baker   Today's Date:  12/29/2023  Admission Date: 12/08/2023  LOS: 21 days                       ASSESSMENT & PLAN     Principal Problem:    Oropharyngeal mass  Active Problems:    Oropharyngeal cancer (CMS-HCC)    Severe malnutrition    Mouth pain    Drug-induced constipation    Encephalopathy acute    68 y.o. 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 He has had increasing pain over the last few days prior to admission near his oropharyngeal mass on the left along with the left aspect of his tongue preventing any PO intake and has a scheduled G-tube placement.        #SARS-COV-2 URI  On 3/29, developing s/sx concerning for new infection  Fevered AM to 100.8  Tachycardic to 113 at 3 AM  Uptrending leukocytosis, WBC = 13  Nasal congestion & cough  Possible sick contacts (family visitors with similar illnesses around this time)   UA unremarkable  Lactate 1.8  CXR (3/29): unremarkable  RVP: COVID +   PLAN  Follow remainder of infectious work-up  Continue Remdesivir while inpatient (3/30- )   Duonebs q6h while awake   Afrin for congestion      #Multiple Brain Infarcts  #Acute Metabolic Encephalopathy (waxing and waning)   Differential Dx: emboli vs watershed infarcts  Low suspicious/excluded: hypoglycemia (BG's normal or elevated), hyponatremia/hypocalcemia/electrolyte abn, no evidence of hypercarbia with normal VBG, thyroid dysfunction (TSH 0.39)   Hx of cisplatin 5 cycles, which can cause encephalopathy/PRES  vEEG: mild encephalopathy  MRI attempted x2 with ativan but patient was unable to tolerate without anesthesia  MRI brain 3/24 : Scattered small acute to subacute bilateral cerebral white matter Infarcts concerning for emboli.  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  PLAN  Daily thiamine supplementation  Scheduled melatonin & trazodone 50mg , continue delirium precautions  Neurology consulted  ASA 81, optimize BP & diabetes, not consistent with PRES despite cisplatin, likely watershed infarct     #Neutropenic fever- resolved  #Presumed Aspiration pneumonia- resolved  #Sepsis- resolved  Tachycardia, fever, increase in WBC (leukopenic at baseline)  Source likely pulmonary given imaging findings  UA unremarkable  CTA Chest (3/17): Development of a few clustered nodular and patchy opacities within the   right greater than left lower lobes, probably infectious or aspiration-related.   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  PLAN  Trulicity nonformulary, will hold inpatient  Holding Glyburide 2.5 mg daily   Continue metformin 500 BID for hyperglycemia via G-tube     #  Dysphagia   #Oropharyngeal pain  Due to left oropharyngeal cancer  G-tube placed 3/12 AM given significant pain and inability to tolerate PO   Will need the G-tube feeds for >90 days.  PLAN  Magic mouthwash TID PRN for oropharyngeal pain  Consulted Dietician  Transitioned to tube feeds 3/15, formal recs in dietician note  Palliative Care consult for pain management as above.   Fentanyl patch 82mcg/hr  Oxycodone 15mg  q4h PRN  Gabapentin 300mg  TID  Bowel regimen with senokot-s 2ts BID, miralax BID  Fluconazole 400mg  qday for oral candidiasis until 4/4                 Left HPV-Mediated Oropharyngeal SCC  Currently on chemo and radiation  Follow in Oncology clinic with dr Neita Carp and radiation Oncology Dr Mariane Masters  CT Neck revealed:  1.  Marked decrease size of the prior large left oropharyngeal mass. There   is a residual 1.7 x 1.5 cm lesion involving the left tonsil with central   fluid attenuation. Treated necrotic tumor is the leading consideration. A   post therapeutic retention cyst could appear similar. An abscess is   thought less likely though direct visualization and clinical correlation   is recommended regarding symptoms of infection.   2.  Improvement of bilateral cervical lymphadenopathy.   Radiation Oncology finished radiation while inpatient.(3/17-3/19)  PLAN  Triamcinolone cream for radiation induced dermatitis  Determine further antineoplastic therapy as OP with Dr. Neita Carp      #Pancytopenia   #Neutropenia  ANC <1  Likely due to Chemo & radiation  Follow with daily CBC w/ Diff     Hypertension > Continue to hold metoprolol & losartan    #BUNDLE   FEN:  Bolus tube feeds with free water flushes/Replace electrolytes as needed/Tube feeds  DIET CLEAR LIQUID w/ Body mass index is 32.36 kg/m?Marland Kitchen   DVT PPX:  40mg  qday enoxaparin, SCDs bilat  Code status: Full Code     Pt seen and discussed with my attending, Dr. Sharen Hones, MD, who directed above plan of care.    Oletta Lamas, MD  Internal Medicine Resident, PGY1  Available by [facility] and Mobile paging  ________________________________________________________________________      SUBJECTIVE     No acute overnight events. Daryl Baker is seen and evaluated at bedside. Patient reports he continues to have cough and shortness of breath, attributed to active COVID infection. He is having significant congestion and secretions as well. Denies fevers/chills, chest pain, nausea/vomiting.     Medications     Scheduled Meds:aspirin chewable tablet 81 mg, 81 mg, PEG Tube, QDAY  Diet Enteral Feeding Bolus, , SEE ADMIN INSTRUCTIONS, 5XDAY (6,10,14,18,22   And  Water Bolus, 100 mL, PEG Tube, 5XDAY (6,10,14,18,22  enoxaparin (LOVENOX) syringe 40 mg, 40 mg, Subcutaneous, QDAY(21)  fentaNYL (DURAGESIC) 50 mcg/hr patch 1 patch, 1 patch, Transdermal, Q72H*   And  Verification of Patch Placement and Integrity - fentaNYL 50 mcg/hr, , Transdermal, BID  fluconazole (DIFLUCAN) oral suspension 200 mg, 200 mg, Oral, QDAY  gabapentin (NEURONTIN) capsule 300 mg, 300 mg, Per G Tube, TID  glyBURIDE (DIABETA) tablet 2.5 mg, 2.5 mg, Oral, QDAY  melatonin tablet 10 mg, 10 mg, Oral, QHS  metFORMIN (GLUCOPHAGE) tablet 500 mg, 500 mg, Oral, BID w/meals  polyethylene glycol 3350 (MIRALAX) packet 17 g, 1 packet, Per G Tube, QDAY  remdesivir 100 mg in sodium chloride 0.9% (NS) 100 mL IVPB (MB+), 100 mg, Intravenous, Q24H*  rosuvastatin (CRESTOR) tablet 20 mg,  20 mg, Oral, QHS  sennosides-docusate sodium (SENOKOT-S) tablet 2 tablet, 2 tablet, Per G Tube, QDAY  thiamine mononitrate (vit B1) tablet 100 mg, 100 mg, PEG Tube, QDAY  traZODone (DESYREL) tablet 75 mg, 75 mg, Oral, QHS  triamcinolone acetonide (KENALOG) 0.1 % topical cream 1 g, 1 g, Topical, QDAY    Continuous Infusions:  PRN and Respiratory Meds:acetaminophen Q6H PRN, bisacodyL QDAY PRN, dextrose 50% (D50) IV PRN, diphenhydrAMINE/lidocaine/mag,al-simeth(#) TID PRN, guaiFENesin  (ROBITUSSIN) oral solution Q4H PRN, ondansetron Q6H PRN **OR** ondansetron (ZOFRAN) IV Q6H PRN, oxyCODONE Q3H PRN, pancrelipase 20,880 Units/sodium bicarbonate 650 mg ([place] CLOG DESTROYER) PRN (On Call from Rx), phenoL PRN, sodium chloride PRN      Medications Prior to Admission   Medication Sig Dispense Refill Last Dose/Taking   ? dexAMETHasone (DECADRON) 4 mg tablet Beginning 24 hours after each Cisplatin infusion, take 2 tablets by mouth daily for 3 days. 36 tablet 0 12/01/2023   ? FREESTYLE LIBRE 2 READER reader Use one each as directed every 6 hours.   Taking   ? lidocaine hcl viscous (LIDOCAINE VISCOUS) 2 % solution Swish and Spit 5 mL by mouth as directed four times daily as needed. 100 mL 3 Past Week   ? losartan (COZAAR) 50 mg tablet Take two tablets by mouth daily.   12/08/2023 Morning   ? metFORMIN-XR (GLUCOPHAGE XR) 750 mg extended release tablet Take one tablet by mouth daily with dinner.   12/07/2023 Bedtime   ? metoprolol succinate XL (TOPROL XL) 100 mg extended release tablet Take one tablet by mouth daily.   12/08/2023 Morning   ? morphine SR (MS CONTIN) 15 mg tablet Take one tablet by mouth every 12 hours.   Taking   ? [EXPIRED] morphine SR (MS CONTIN) 30 mg ER tablet Take one tablet by mouth every 12 hours for 30 days. Indications: severe chronic pain requiring long-term opioid treatment  Dose adjustment requiring early fill 60 tablet 0 12/08/2023 Morning   ? OLANZapine (ZYPREXA) 5 mg tablet Take 1 tablet by mouth daily at bedtime for 4 days starting on Cisplatin infusion day.  Indications: prevent nausea and vomiting from cancer chemotherapy 24 tablet 0 Taking   ? ondansetron HCL (ZOFRAN) 8 mg tablet Take one tablet by mouth every 8 hours as needed (nausea and vomiting). Indications: prevent nausea and vomiting from cancer chemotherapy 90 tablet 0 Taking As Needed   ? oxyCODONE (ROXICODONE) 5 mg tablet Take one tablet to two tablets by mouth every 4-6 hours as needed for Pain. Indications: pain 120 tablet 0 12/08/2023 Morning   ? triamcinolone acetonide (TRIDERM) 0.1 % topical cream Apply one g topically to affected area twice daily. 80 g 3 Past Week   ? TRULICITY 3 mg/0.5 mL injection pen Inject 0.5 mL under the skin every 7 days. Sunday   12/07/2023 Morning       Allergies     No Known Allergies    OBJECTIVE                            Vital Signs: Last Filed                 Vital Signs: 24 Hour Range   BP: 137/62 (03/31 0334)  Temp: 37.1 ?C (98.7 ?F) (03/31 1610)  Pulse: 101 (03/30 2333)  Respirations: 20 PER MINUTE (03/31 0334)  SpO2: 100 % (03/31 0334)  O2 Device: None (Room air) (03/31 0334) BP: (115-145)/(53-96) Temp:  [  36.9 ?C (98.4 ?F)-37.7 ?C (99.9 ?F)]   Pulse:  [95-133]   Respirations:  [16 PER MINUTE-21 PER MINUTE]   SpO2:  [93 %-100 %]   O2 Device: None (Room air)   Intensity Pain Scale (Self Report): 9 (12/28/23 0840) Vitals:    12/09/23 1715 12/23/23 0752 12/25/23 1242   Weight: 101.5 kg (223 lb 12.8 oz) 101.5 kg (223 lb 12.3 oz) 105.2 kg (232 lb)           Intake/Output Summary: (Last 24 Hours)       Intake/Output Summary (Last 24 hours) at 12/29/2023 0654  Last data filed at 12/29/2023 1610  Gross per 24 hour   Intake 864 ml   Output 1100 ml   Net -236 ml           Physical Exam     Physical Exam  Vitals and nursing note reviewed.   Constitutional:       General: He is not in acute distress.  HENT:      Head: Normocephalic and atraumatic.      Nose: Nose normal.      Mouth/Throat:      Mouth: Mucous membranes are moist.      Pharynx: Oropharynx is clear.      Comments: Copious clearish-white secretions at posterior pharynx. Leukoplakia noted.  Eyes:      Extraocular Movements: Extraocular movements intact.   Cardiovascular:      Rate and Rhythm: Normal rate and regular rhythm.   Pulmonary:      Effort: No respiratory distress.      Breath sounds: Wheezing and rales present.   Abdominal:      Palpations: Abdomen is soft.      Comments: G-tube in place without surrounding erythema, edema, bleeding, drainage   Musculoskeletal:      Right lower leg: No edema.      Left lower leg: No edema.   Skin:     General: Skin is warm and dry.   Neurological:      General: No focal deficit present.      Mental Status: He is alert.      Cranial Nerves: No cranial nerve deficit.   Psychiatric:         Mood and Affect: Mood normal.         Behavior: Behavior normal.         Thought Content: Thought content normal.         Judgment: Judgment normal.       Lab Review     24-hour labs:    Results for orders placed or performed during the hospital encounter of 12/08/23 (from the past 24 hours)   POC GLUCOSE    Collection Time: 12/28/23 6:14 PM   Result Value Ref Range    Glucose, POC 161 (H) 70 - 100 mg/dL   POC GLUCOSE    Collection Time: 12/28/23  9:27 PM   Result Value Ref Range    Glucose, POC 103 (H) 70 - 100 mg/dL   CBC AND DIFF    Collection Time: 12/29/23  3:33 AM   Result Value Ref Range    White Blood Cells 14.40 (H) 4.50 - 11.00 10*3/uL    Red Blood Cells 2.69 (L) 4.40 - 5.50 10*6/uL    Hemoglobin 8.7 (L) 13.5 - 16.5 g/dL    Hematocrit 96.0 (L) 40.0 - 50.0 %    MCV 96.1 80.0 - 100.0 fL    MCH 32.4 26.0 -  34.0 pg    MCHC 33.7 32.0 - 36.0 g/dL    RDW 69.6 (H) 29.5 - 15.0 %    Platelet Count 418 (H) 150 - 400 10*3/uL    MPV 9.1 7.0 - 11.0 fL   COMPREHENSIVE METABOLIC PANEL    Collection Time: 12/29/23  3:33 AM   Result Value Ref Range    Sodium 136 (L) 137 - 147 mmol/L    Potassium 4.7 3.5 - 5.1 mmol/L    Chloride 99 98 - 110 mmol/L    Glucose 68 (L) 70 - 100 mg/dL    Blood Urea Nitrogen 14 7 - 25 mg/dL    Creatinine 2.84 1.32 - 1.24 mg/dL    Calcium 8.1 (L) 8.5 - 10.6 mg/dL    Total Protein 5.9 (L) 6.0 - 8.0 g/dL    Total Bilirubin 0.3 0.2 - 1.3 mg/dL    Albumin 3.0 (L) 3.5 - 5.0 g/dL    Alk Phosphatase 75 25 - 110 U/L    AST 20 7 - 40 U/L    ALT 10 7 - 56 U/L    CO2 28 21 - 30 mmol/L    Anion Gap 9 3 - 12    Glomerular Filtration Rate (GFR) >60 >60 mL/min   MAGNESIUM    Collection Time: 12/29/23  3:33 AM   Result Value Ref Range    Magnesium 2.0 1.6 - 2.6 mg/dL   MANUAL DIFF    Collection Time: 12/29/23  3:33 AM   Result Value Ref Range    ANISO Present     POLY Present     Platelet Estimate Slight Increase     Segmented Neutrophils 52 41 - 77 %    Lymphocytes 4 (L) 24 - 44 %    Monocytes 26 (H) 4 - 12 %    Metamyelocyte 7 %    Myelocyte 8 %    Bands 2 0 - 10 %    Blast 1 %    Absolute Neutrophil Count Manual 7.8 (H) 1.8 - 7.0 10*3/uL    nRBCs (Manual Diff) 1 10*3/uL   POC GLUCOSE    Collection Time: 12/29/23  8:34 AM   Result Value Ref Range    Glucose, POC 233 (H) 70 - 100 mg/dL   POC GLUCOSE    Collection Time: 12/29/23 11:58 AM Result Value Ref Range    Glucose, POC 137 (H) 70 - 100 mg/dL       Point of Care Testing  (Last 24 hours)  Glucose: (!) 68 (12/29/23 0333)  POC Glucose (Download): (!) 103 (12/28/23 2127)      Radiology and Other Diagnostic Review      Pertinent radiology reviewed.

## 2023-12-29 NOTE — Progress Notes
 PHYSICAL THERAPY  NOTE      Name: Berkeley Vanaken   MRN: [id number]     DOB: 1956/08/03      Age: 68 y.o.  Admission Date: 12/08/2023     LOS: 21 days     Date of Service: 12/29/2023      Patient declined to participate despite encouragement and education about the role and benefits of Physical Therapy due to pain.  Rehabilitation services will continue to follow and provide intervention as indicated.    Therapist: Ranae Plumber, PT  Date: 12/29/2023

## 2023-12-29 NOTE — Case Management (ED)
 Case Management Progress Note    NAME:Daryl Baker                          MRN: [id number]              DOB:31-Mar-1956          AGE: 68 y.o.  ADMISSION DATE: 12/08/2023             DAYS ADMITTED: LOS: 21 days      Today's Date: 12/29/2023    PLAN: Discharge to SNF anticipated; referrals pending.     Expected Discharge Date: 12/31/2023   Is Patient Medically Stable: Yes   Are there Barriers to Discharge? yes (placement)    INTERVENTION/DISPOSITION:  Discharge Planning              Discharge Planning: Inpatient Rehabilitation    SW attended team huddle and reviewed EMR. Pt remains stable for discharge pending placement.     SW followed up on pending referrals:    [facility] in [place] - left voicemail for admissions  [facility] - do not see that referral was sent; SW sent referral to fax [phone]  [facility] swing bed - denied, unable to meet needs; do not have speech therapy    SW updated pt spouse, Daryl Baker, via email - [email or web address].      Transportation              Does the Patient Need Case Management to Arrange Discharge Transport? (ex: facility, ambulance, wheelchair/stretcher, Medicaid, cab, other): No  Transportation Name, Phone and Availability #1: Daryl Baker, Daryl (Spouse)  [phone] (Mobile)  Support              Support: Pt/Family Updates re:POC or DC Plan, Huddle/team update  Info or Referral              Information or Referral to Community Resources: No Needs Identified  Positive SDOH Domains and Potential Barriers   Positive for SDOH Domain:  (No Needs Identified)               Medication Needs              Medication Needs: No Needs Identified                                                                                                                                          Financial              Financial: No Needs Identified  Legal              Legal: No Needs Identified  Other              Other/None: No needs identified  Discharge Disposition  Selected Continued Care - Admitted Since 12/08/2023       [facility] Coordination complete.      Service Provider Services Address Phone Fax Patient Preferred    [facility] [address], [place] [address] [phone] --               Dialysis/Infusion Coordination complete.      Service Provider Services Address Phone Fax Patient Preferred    [facility] [address], [place] [address] [phone] --                      Daryl Baker    [facility]

## 2023-12-30 LAB — POC GLUCOSE
~~LOC~~ BKR POC GLUCOSE: 213 mg/dL — ABNORMAL HIGH (ref 70–100)
~~LOC~~ BKR POC GLUCOSE: 196 mg/dL — ABNORMAL HIGH (ref 70–100)
~~LOC~~ BKR POC GLUCOSE: 155 mg/dL — ABNORMAL HIGH (ref 70–100)

## 2023-12-30 LAB — MANUAL DIFF
~~LOC~~ BKR BLASTS - RELATIVE: 1 % — ABNORMAL HIGH (ref 70–100)
~~LOC~~ BKR LYMPHOCYTES - RELATIVE: 7 % — ABNORMAL LOW (ref 24–44)
~~LOC~~ BKR METAMYELOCYTES - RELATIVE: 5 % (ref 5.0–15.0)
~~LOC~~ BKR MONOCYTES - RELATIVE: 27 % — ABNORMAL HIGH (ref 4–12)
~~LOC~~ BKR MYELOCYTES - RELATIVE: 3 % — ABNORMAL LOW (ref 0.30–0.70)
~~LOC~~ BKR NEUT+BANDS - ABSOLUTE: 7.1 10*3/uL — ABNORMAL HIGH (ref 1.8–7.0)
~~LOC~~ BKR NEUTROPHILS - RELATIVE: 57 % (ref 41–77)

## 2023-12-30 NOTE — Progress Notes
 CLINICAL NUTRITION                                                        Clinical Nutrition Follow-Up Assessment     Name: Daryl Baker   MRN: [id number]     DOB: 10-04-1955      Age: 68 y.o.  Admission Date: 12/08/2023     LOS: 22 days     Date of Service: 12/30/2023        Recommendation:  Least restrictive PO diet per SLP/primary team recs.     Continue bolus feeds of TwoCal HN, total of 5.5 cartons daily.   1 carton (237 mL) per feed QID + 1.5 carton (~356 mL) per feed 1x/day.  This provides 2613 kcal/day, 109g protein/day, and free water/day.   At least 30mL free water flush before and after each bolus feed. Additional fluids per primary team recs.  Avoid using plunger of syringe when administering bolus feeds- administer via gravity.      Comments:  Clinical nutrition following. Pt found to be COVID+, per MD note 3/30. SLP evaluated pt 3/31- PO intake limited given odynophagia; completed trials of clear liquids only this date, pt declined additional PO trials. SLP recommended clear liquid diet. Pt is stable for d/c pending placement. Per MAR, pt has been receiving 5 feeds/day over the last few days. Amt given at each feed not documented consistently, so questioning accuracy of EN documentation in I/Os. Though, amt was documented for each feed on 3/31. Visited pt today for f/u. Denied issues/concerns with TF, tolerating well. Denied n/v, abd pain/discomfort. Reports some constipation. +BM 3/31 per EMR, bowel regimen on board. Does report some discomfort when bolus feeds are given with plunger of syringe, does not occur often. Discussed asking RN staff to avoid this and provide gravity bolus feeds instead (w/o plunger of syringe). Spoke to RN after visit- no TF issues/concerns, doing well. RN reports that she has been doing gravity bolus feeds, unsure about other staff.  RD will continue to follow.     Meds- liquid Tylenol, glyburide (held), metformin, oxycodone, miralax, senna, thiamine. Labs- 135 Na, 88-233 glucose POC.                            Nutrition Assessment of Patient:  Admit Weight: 102 kg; Desired Weight: 79.9 kg  BMI (Calculated): 32.36;    Pertinent Allergies/Intolerances: none per EMR      Oral Diet Order: Clear liquid;    Current EN Order: Bolus feeds- TwoCal HN, 5.5 cartons/day  Current Energy Intake: Inadequate  Weight Used for Calculation: 79.9 kg  Estimated Calorie Needs: 2395-2795 kcal (30-35 kcal/kg DBW)  Estimated Protein Needs: 104-120 g protein (1.3-1.5 g protein/kg DBW)    Malnutrition Assessment:  Malnutrition present on admission  ICD-10 code E43: Chronic illness/Severe malnutrition        Energy intake: 75% or less of estimated energy requirement for 1 month or more, Weight loss: Greater than 20% x 1 year          Malnutrition Interventions: Continue to monitor TF adequacy, tolerance    Nutrition Focused Physical Exam:  Loss of Subcutaneous Fat: Yes; Severity: Moderate; Location: Triceps  Muscle Wasting: Yes; Severity: Mild; Location: [place], Deltoid    Physical Assessment:  Generalized Edema: Non-pitting     Pressure Injury: stage 2 L buttocks + R buttocks     Comment: +BM 3/31 per EMR    Nutrition Diagnosis:  Inadequate oral intake  Etiology: mouth pain, PO intake intolerance, poor appetite  Signs & Symptoms: pt report per chart review, wife report, need for G-tube placement and EN recs      Intervention / Plan:  Monitor wt trends, labs, I/Os, GI/BMs      Blakely Maranan, MCN, RDN, LD  Available on [employer]

## 2023-12-30 NOTE — Progress Notes
 PHYSICAL THERAPY  NOTE      Name: Daryl Baker   MRN: [id number]     DOB: [birth year]      Age: 68 y.o.  Admission Date: 12/08/2023     LOS: 22 days     Date of Service: 12/30/2023    PT attempted to see patient for ongoing treatment session. Upon entering room, patient declined to participate, stating it's too late in the day to do anything. PT offered to stay in the room and modify the session, but the patient continued to decline. Patient requested for PT/OT to see him in the morning moving forward if possible. PT will continue to follow and provide intervention as able.    Therapist: Asher Muir, PT  Date: 12/30/2023

## 2023-12-30 NOTE — Case Management (ED)
 CMA Note:       Sent an in network SNF list for the [facility] area to [employer], [facility].     Calla Kicks  Case Management Assistant  For additional assistance please contact SWCM

## 2023-12-30 NOTE — Progress Notes
 Inpatient Medical Oncology Progress Note    Name:  Daryl Baker   Today's Date:  12/30/2023  Admission Date: 12/08/2023  LOS: 22 days                       ASSESSMENT & PLAN     Principal Problem:    Oropharyngeal mass  Active Problems:    Oropharyngeal cancer (CMS-HCC)    Severe malnutrition    Mouth pain    Drug-induced constipation    Encephalopathy acute    68 y.o. 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 He has had increasing pain over the last few days prior to admission near his oropharyngeal mass on the left along with the left aspect of his tongue preventing any PO intake and has a scheduled G-tube placement.        #SARS-COV-2 URI  On 3/29, developing s/sx concerning for new infection  Fevered AM to 100.8  Tachycardic to 113 at 3 AM  Uptrending leukocytosis, WBC = 13  Nasal congestion & cough  Possible sick contacts (family visitors with similar illnesses around this time)   UA unremarkable  Lactate 1.8  CXR (3/29): unremarkable  RVP: COVID +   PLAN  Follow remainder of infectious work-up  Continue Remdesivir pending placement (3/30- )   Duonebs q6h while awake   Afrin for congestion      #Multiple Brain Infarcts  #Acute Metabolic Encephalopathy (waxing and waning)   Differential Dx: emboli vs watershed infarcts  Low suspicious/excluded: hypoglycemia (BG's normal or elevated), hyponatremia/hypocalcemia/electrolyte abn, no evidence of hypercarbia with normal VBG, thyroid dysfunction (TSH 0.39)   Hx of cisplatin 5 cycles, which can cause encephalopathy/PRES  vEEG: mild encephalopathy  MRI attempted x2 with ativan but patient was unable to tolerate without anesthesia  MRI brain 3/24 : Scattered small acute to subacute bilateral cerebral white matter Infarcts concerning for emboli.  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  PLAN  Daily thiamine supplementation  Scheduled melatonin & trazodone 50mg , continue delirium precautions  Neurology consulted  ASA 81, optimize BP & diabetes, not consistent with PRES despite cisplatin, likely watershed infarct     #Neutropenic fever- resolved  #Presumed Aspiration pneumonia- resolved  #Sepsis- resolved  Tachycardia, fever, increase in WBC (leukopenic at baseline)  Source likely pulmonary given imaging findings  UA unremarkable  CTA Chest (3/17): Development of a few clustered nodular and patchy opacities within the   right greater than left lower lobes, probably infectious or aspiration-related.   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  PLAN  Trulicity nonformulary, will hold inpatient  Holding Glyburide 2.5 mg daily   Continue metformin 500 BID for hyperglycemia via G-tube     #  Dysphagia   #Oropharyngeal pain  Due to left oropharyngeal cancer  G-tube placed 3/12 AM given significant pain and inability to tolerate PO   Will need the G-tube feeds for >90 days.  PLAN  Magic mouthwash TID PRN for oropharyngeal pain  Consulted Dietician  Transitioned to tube feeds 3/15  Palliative Care consult for pain management as above.   Fentanyl patch 38mcg/hr  Oxycodone 15mg  q4h PRN  Gabapentin 300mg  TID  Bowel regimen with senokot-s 2ts BID, miralax BID  Fluconazole 400mg  qday for oral candidiasis until 4/4                 Left HPV-Mediated Oropharyngeal SCC  Currently on chemo and radiation  Follow in Oncology clinic with dr Neita Carp and radiation Oncology Dr Mariane Masters  CT Neck revealed:  1.  Marked decrease size of the prior large left oropharyngeal mass. There   is a residual 1.7 x 1.5 cm lesion involving the left tonsil with central   fluid attenuation. Treated necrotic tumor is the leading consideration. A   post therapeutic retention cyst could appear similar. An abscess is   thought less likely though direct visualization and clinical correlation   is recommended regarding symptoms of infection.   2.  Improvement of bilateral cervical lymphadenopathy.   Radiation Oncology finished radiation while inpatient.(3/17-3/19)  PLAN  Triamcinolone cream for radiation induced dermatitis  Determine further antineoplastic therapy as OP with Dr. Neita Carp      #Pancytopenia   #Neutropenia  ANC <1  Likely due to Chemo & radiation  Follow with daily CBC w/ Diff     Hypertension > Continue to hold metoprolol & losartan    #BUNDLE   FEN:  Bolus tube feeds with free water flushes/Replace electrolytes as needed/Tube feeds  DIET CLEAR LIQUID w/ Body mass index is 32.36 kg/m?Marland Kitchen   DVT PPX:  40mg  qday enoxaparin, SCDs bilat  Code status: Full Code     Pt seen and discussed with my attending, Dr. Sharen Hones, MD, who directed above plan of care.    Oletta Lamas, MD  Internal Medicine Resident, PGY1  Available by [facility] and Mobile paging  ________________________________________________________________________      SUBJECTIVE     No acute overnight events. Mr. Pokorny is seen and evaluated at bedside. Patient reports continued cough and congestion. He has not noticed much change with use of mucolytics, inhaler therapy, or decongestants. Feeling somewhat cooped up with continued admission. Also having some generalized anxiety. Mentation continues to be waxing and waning with intermittent confusion and paranoia. Patient noted to perseverate on topics during interview, often stating it took 3 weeks for me to get here, it'll take 2 weeks to get out.    Medications     Scheduled Meds:aspirin chewable tablet 81 mg, 81 mg, PEG Tube, QDAY  Diet Enteral Feeding Bolus, , SEE ADMIN INSTRUCTIONS, 5XDAY (6,10,14,18,22   And  Water Bolus, 100 mL, PEG Tube, 5XDAY (6,10,14,18,22  enoxaparin (LOVENOX) syringe 40 mg, 40 mg, Subcutaneous, QDAY(21)  fentaNYL (DURAGESIC) 50 mcg/hr patch 1 patch, 1 patch, Transdermal, Q72H*   And  Verification of Patch Placement and Integrity - fentaNYL 50 mcg/hr, , Transdermal, BID  fluconazole (DIFLUCAN) oral suspension 200 mg, 200 mg, Oral, QDAY  gabapentin (NEURONTIN) capsule 300 mg, 300 mg, Per G Tube, TID  [Held by Provider] glyBURIDE (DIABETA) tablet 2.5 mg, 2.5 mg, Oral, QDAY  melatonin tablet 10 mg, 10 mg, Oral, QHS  metFORMIN (GLUCOPHAGE) tablet 500 mg, 500 mg, Oral, BID w/meals  polyethylene glycol 3350 (MIRALAX) packet 17 g, 1 packet, Per G Tube, QDAY  remdesivir 100 mg in sodium chloride 0.9% (NS) 100 mL IVPB (MB+), 100 mg, Intravenous, Q24H*  rosuvastatin (CRESTOR) tablet 20 mg, 20 mg, Oral, QHS  sennosides-docusate sodium (SENOKOT-S) tablet 2 tablet, 2 tablet, Per G Tube, QDAY  thiamine mononitrate (vit B1) tablet 100 mg, 100 mg, PEG Tube, QDAY  traZODone (DESYREL) tablet 75 mg, 75 mg, Oral, QHS  triamcinolone acetonide (KENALOG) 0.1 % topical cream 1 g, 1 g, Topical, QDAY    Continuous Infusions:  PRN and Respiratory Meds:acetaminophen Q6H PRN, albuterol-ipratropium Q6H PRN, bisacodyL QDAY PRN, dextrose 50% (D50) IV PRN, diphenhydrAMINE/lidocaine/mag,al-simeth(#) TID PRN, guaiFENesin  (ROBITUSSIN) oral solution Q4H PRN, ondansetron Q6H PRN **OR** ondansetron (ZOFRAN) IV Q6H PRN, oxyCODONE Q3H PRN, oxymetazoline BID PRN, pancrelipase 20,880 Units/sodium bicarbonate 650 mg ([place] CLOG DESTROYER) PRN (On Call from Rx), phenoL PRN, sodium chloride PRN      Medications Prior to Admission   Medication Sig Dispense Refill Last Dose/Taking   ? dexAMETHasone (DECADRON) 4 mg tablet Beginning 24 hours after each Cisplatin infusion, take 2 tablets by mouth daily for 3 days. 36 tablet 0 12/01/2023   ? FREESTYLE LIBRE 2 READER reader Use one each as directed every 6 hours.   Taking   ? lidocaine hcl viscous (LIDOCAINE VISCOUS) 2 % solution Swish and Spit 5 mL by mouth as directed four times daily as needed. 100 mL 3 Past Week   ? losartan (COZAAR) 50 mg tablet Take two tablets by mouth daily.   12/08/2023 Morning   ? metFORMIN-XR (GLUCOPHAGE XR) 750 mg extended release tablet Take one tablet by mouth daily with dinner.   12/07/2023 Bedtime   ? metoprolol succinate XL (TOPROL XL) 100 mg extended release tablet Take one tablet by mouth daily.   12/08/2023 Morning   ? morphine SR (MS CONTIN) 15 mg tablet Take one tablet by mouth every 12 hours.   Taking   ? [EXPIRED] morphine SR (MS CONTIN) 30 mg ER tablet Take one tablet by mouth every 12 hours for 30 days. Indications: severe chronic pain requiring long-term opioid treatment  Dose adjustment requiring early fill 60 tablet 0 12/08/2023 Morning   ? OLANZapine (ZYPREXA) 5 mg tablet Take 1 tablet by mouth daily at bedtime for 4 days starting on Cisplatin infusion day.  Indications: prevent nausea and vomiting from cancer chemotherapy 24 tablet 0 Taking   ? ondansetron HCL (ZOFRAN) 8 mg tablet Take one tablet by mouth every 8 hours as needed (nausea and vomiting). Indications: prevent nausea and vomiting from cancer chemotherapy 90 tablet 0 Taking As Needed   ? oxyCODONE (ROXICODONE) 5 mg tablet Take one tablet to two tablets by mouth every 4-6 hours as needed for Pain. Indications: pain 120 tablet 0 12/08/2023 Morning   ? triamcinolone acetonide (TRIDERM) 0.1 % topical cream Apply one g topically to affected area twice daily. 80 g 3 Past Week   ? TRULICITY 3 mg/0.5 mL injection pen Inject 0.5 mL under the skin every 7 days. Sunday   12/07/2023 Morning       Allergies     No Known Allergies    OBJECTIVE                            Vital Signs: Last Filed                 Vital Signs: 24 Hour Range  BP: 131/53 (03/31 2325)  Temp: 36.3 ?C (97.4 ?F) (03/31 2325)  Pulse: 100 (03/31 2325)  Respirations: 16 PER MINUTE (03/31 2325)  SpO2: 95 % (03/31 2325)  O2 Device: None (Room air) (03/31 2325) BP: (107-131)/(51-83)   Temp:  [36.3 ?C (97.4 ?F)-37.1 ?C (98.7 ?F)]   Pulse:  [98-106]   Respirations:  [16 PER MINUTE-20 PER MINUTE]   SpO2:  [95 %-97 %]   O2 Device: None (Room air)   Intensity Pain Scale (Self Report): 9 (12/29/23 0816) Vitals:    12/09/23 1715 12/23/23 0752 12/25/23 1242   Weight: 101.5 kg (223 lb 12.8 oz) 101.5 kg (223 lb 12.3 oz) 105.2 kg (232 lb)           Intake/Output Summary: (Last 24 Hours)       Intake/Output Summary (Last 24 hours) at 12/30/2023 0648  Last data filed at 12/30/2023 1610  Gross per 24 hour   Intake 904 ml   Output --   Net 904 ml           Physical Exam     Physical Exam  Vitals and nursing note reviewed.   Constitutional:       General: He is not in acute distress.  HENT:      Head: Normocephalic and atraumatic.      Nose: Nose normal.      Mouth/Throat:      Mouth: Mucous membranes are moist.      Pharynx: Oropharynx is clear.   Eyes:      Extraocular Movements: Extraocular movements intact.   Cardiovascular:      Rate and Rhythm: Normal rate and regular rhythm.   Pulmonary:      Effort: No respiratory distress.      Breath sounds: Wheezing and rales present.   Abdominal:      Palpations: Abdomen is soft.      Comments: G-tube in place without surrounding erythema, edema, bleeding, drainage   Musculoskeletal:      Right lower leg: No edema.      Left lower leg: No edema.   Skin:     General: Skin is warm and dry.   Neurological:      General: No focal deficit present.      Mental Status: He is alert.      Cranial Nerves: No cranial nerve deficit.   Psychiatric:         Mood and Affect: Mood normal.         Behavior: Behavior normal.         Thought Content: Thought content normal.         Judgment: Judgment normal. Lab Review     24-hour labs:    Results for orders placed or performed during the hospital encounter of 12/08/23 (from the past 24 hours)   POC GLUCOSE    Collection Time: 12/29/23  8:34 AM   Result Value Ref Range    Glucose, POC 233 (H) 70 - 100 mg/dL   POC GLUCOSE    Collection Time: 12/29/23 11:58 AM   Result Value Ref Range    Glucose, POC 137 (H) 70 - 100 mg/dL   POC GLUCOSE    Collection Time: 12/29/23  6:06 PM   Result Value Ref Range    Glucose, POC 88 70 - 100 mg/dL   CBC AND DIFF    Collection Time: 12/30/23  2:18 AM   Result Value Ref Range    White Blood Cells 12.40 (  H) 4.50 - 11.00 10*3/uL    Red Blood Cells 2.66 (L) 4.40 - 5.50 10*6/uL    Hemoglobin 8.5 (L) 13.5 - 16.5 g/dL    Hematocrit 09.8 (L) 40.0 - 50.0 %    MCV 94.4 80.0 - 100.0 fL    MCH 32.2 26.0 - 34.0 pg    MCHC 34.1 32.0 - 36.0 g/dL    RDW 11.9 (H) 14.7 - 15.0 %    Platelet Count 393 150 - 400 10*3/uL    MPV 9.2 7.0 - 11.0 fL   COMPREHENSIVE METABOLIC PANEL    Collection Time: 12/30/23  2:18 AM   Result Value Ref Range    Sodium 135 (L) 137 - 147 mmol/L    Potassium 4.8 3.5 - 5.1 mmol/L    Chloride 99 98 - 110 mmol/L    Glucose 86 70 - 100 mg/dL    Blood Urea Nitrogen 14 7 - 25 mg/dL    Creatinine 8.29 5.62 - 1.24 mg/dL    Calcium 7.7 (L) 8.5 - 10.6 mg/dL    Total Protein 6.0 6.0 - 8.0 g/dL    Total Bilirubin 0.2 0.2 - 1.3 mg/dL    Albumin 3.0 (L) 3.5 - 5.0 g/dL    Alk Phosphatase 65 25 - 110 U/L    AST 22 7 - 40 U/L    ALT 11 7 - 56 U/L    CO2 30 21 - 30 mmol/L    Anion Gap 6 3 - 12    Glomerular Filtration Rate (GFR) >60 >60 mL/min   MAGNESIUM    Collection Time: 12/30/23  2:18 AM   Result Value Ref Range    Magnesium 2.3 1.6 - 2.6 mg/dL   MANUAL DIFF    Collection Time: 12/30/23  2:18 AM   Result Value Ref Range    ANISO Present     POLY Present     Platelet Estimate Moderate Decrease     Segmented Neutrophils 57 41 - 77 %    Lymphocytes 7 (L) 24 - 44 %    Monocytes 27 (H) 4 - 12 %    Metamyelocyte 5 %    Myelocyte 3 %    Blast 1 % Absolute Neutrophil Count Manual 7.1 (H) 1.8 - 7.0 10*3/uL       Point of Care Testing  (Last 24 hours)  Glucose: 86 (12/30/23 0218)  POC Glucose (Download): 88 (12/29/23 1806)      Radiology and Other Diagnostic Review      Pertinent radiology reviewed.

## 2023-12-31 ENCOUNTER — Encounter: Admit: 2023-12-31 | Discharge: 2023-12-31

## 2023-12-31 LAB — MANUAL DIFF
~~LOC~~ BKR LYMPHOCYTES - RELATIVE: 6 % — ABNORMAL LOW (ref 24–44)
~~LOC~~ BKR METAMYELOCYTES - RELATIVE: 5 %
~~LOC~~ BKR MONOCYTES - RELATIVE: 17 % — ABNORMAL HIGH (ref 4–12)
~~LOC~~ BKR MYELOCYTES - RELATIVE: 3 %
~~LOC~~ BKR NEUT+BANDS - ABSOLUTE: 8.8 10*3/uL — ABNORMAL HIGH (ref 1.8–7.0)
~~LOC~~ BKR NEUTROPHILS - RELATIVE: 69 % (ref 41–77)
~~LOC~~ BKR NUCLEATED RBC MANUAL: 1 10*3/uL

## 2023-12-31 LAB — COMPREHENSIVE METABOLIC PANEL
~~LOC~~ BKR ALBUMIN: 3.4 g/dL — ABNORMAL LOW (ref 3.5–5.0)
~~LOC~~ BKR ALK PHOSPHATASE: 80 U/L — ABNORMAL HIGH (ref 25–110)
~~LOC~~ BKR TOTAL BILIRUBIN: 0.3 mg/dL — ABNORMAL HIGH (ref 0.2–1.3)
~~LOC~~ BKR TOTAL PROTEIN: 6.3 g/dL — ABNORMAL HIGH (ref 6.0–8.0)

## 2023-12-31 LAB — POC GLUCOSE
~~LOC~~ BKR POC GLUCOSE: 149 mg/dL — ABNORMAL HIGH (ref 70–100)
~~LOC~~ BKR POC GLUCOSE: 171 mg/dL — ABNORMAL HIGH (ref 70–100)
~~LOC~~ BKR POC GLUCOSE: 207 mg/dL — ABNORMAL HIGH (ref 70–100)
~~LOC~~ BKR POC GLUCOSE: 225 mg/dL — ABNORMAL HIGH (ref 70–100)

## 2023-12-31 NOTE — Case Management (ED)
 Case Management Progress Note    NAME:Daryl Baker                          MRN: [id number]              DOB:1956-02-01          AGE: 68 y.o.  ADMISSION DATE: 12/08/2023             DAYS ADMITTED: LOS: 23 days      Today's Date: 12/31/2023    PLAN: Anticipate d/c home with [facility] services.     Expected Discharge Date: 01/01/2024   Is Patient Medically Stable: Yes   Are there Barriers to Discharge? No    INTERVENTION/DISPOSITION:  Discharge Planning              Discharge Planning: Home Health    SW participated in huddle; EMR reviewed.    Pt is stable for d/c at this time. Current recommendations are for inpatient setting.    9:53am:  SW received email response from pt wife asking about [facility]  SW responded that I wsa unable to reach anyone at this facility as she and I had discussed yesterday afternoon. SW offered to reach out to facility again but did encourage pt wife to look at [facility] list sent in SW previous email and choose 2-3 facilities to send referrals to as a back up.     9:56am:  SW called [facility] and was able to speak with DON, [place]. [place] confirmed they did receive initial referral but had some follow up questions. [place] stated she would have [facility] [place] contact SW with questions.    11:29am:  SW received email follow up from pt wife asking about facilities in [place], [place]. SW responded and reminded wife of conversation yesterday afternoon when it was discussed to have wife look at facilities closer to the hospital/within the [facility] metro area and wife was agreeable to that. A [place] metro in network SNF list was emailed to her per that conversation.     11:42am:  SW spoke with [place] from [facility] who had follow up questions about pt radiation and chemo plans. SW notified Karen Kitchens that pt tested positive for covid on 12/28/23. Karen Kitchens stated she would have to talk with the clinical team to see if anything can be changes as they do not currently have any isolation beds available to admit pt with covid.     11:55am:   SW spoke with [place] again who stated that pt wife had just called them and said that pt has had new strokes. Karen Kitchens stated they did not have that info in the initial referral and requested updated clinicals. SW stated that initial referral was sent 12/28/23, after it was found that pt had small strokes. SW also updated Karen Kitchens that all cancer treatment was on hold until pt completed SNF stay and was able to follow up with outpatient oncologist.     2:15pm:  SW was notified that pt was able to ambulate 500 feet and complete all ADLs with stand by assist. Recs were changed from inpatient to home with intermittent assist    SW spoke with [place] to update on new OT recs for home. Karen Kitchens will cancel referral.     SW spoke with pt wife to update on change in recs and plan for home with [facility] services. SW asked if wife would need  assistance with transportation and she stated that she can take care of it and does not need [place] to help arrange transportation.     SW will continue to follow for d/c planning and placement needs.     Transportation              Does the Patient Need Case Management to Arrange Discharge Transport? (ex: facility, ambulance, wheelchair/stretcher, Medicaid, cab, other): No  Transportation Name, Phone and Availability #1: quran, vasco (Spouse)  [phone] (Mobile)    Support              Support: Pt/Family Updates re:POC or DC Plan, Huddle/team update    Info or Referral              Information or Referral to Community Resources: No Needs Identified    Positive SDOH Domains and Potential Barriers   Positive for SDOH Domain:  (No Needs Identified)     Medication Needs              Medication Needs: No Needs Identified    Financial              Financial: No Needs Identified    Legal              Legal: No Needs Identified    Other              Other/None: No needs identified    Discharge Disposition                               Selected Continued Care - Admitted Since 12/08/2023       [facility] Coordination complete.      Service Provider Services Address Phone Fax Patient Preferred    [facility] [address], [place] [address] [phone] --              [facility] Coordination complete.      Service Provider Services Address Phone Fax Patient Preferred    [facility] [address] [place], [place] [address] [phone] --                    Gray Bernhardt, Danie Binder, LCSW  Inpatient Social Work Case Manager  Med Onc, MPP  Available on Voalte   Work Cell: [phone]

## 2023-12-31 NOTE — Case Management (ED)
 Case Management Progress Note    NAME:Daryl Baker                          MRN: [id number]              DOB:1956-08-19          AGE: 68 y.o.  ADMISSION DATE: 12/08/2023             DAYS ADMITTED: LOS: 23 days      Today's Date: 12/31/2023    PLAN: Per therapy pt has progressed to home w/ [facility] and PT/OT.    Expected Discharge Date: 01/01/2024   Is Patient Medically Stable: Yes   Are there Barriers to Discharge? no    INTERVENTION/DISPOSITION:  Discharge Planning              Discharge Planning: Home Health    NCM reviewed EMR and participated in MedOnc huddle  Pt has progressed and can now safely DC home with [facility] per OT.  [facility]  NCM spoke to [place] with intake and confirmed the update was received.  [facility] can start on friday, 4/4.  Augusto Gamble request a call when pt leaves the building.   Signed orders sent today.  NCM contacted Victorino Dike with [facility] to update on plans for DC.  Signed orders have been sent to [facility].   NCM appreciates Renold Genta, [facility] for contacting pts wife Bonita Quin with update on the plans.  Wife plans to transport patient home.   CM team will cont to follow.     Transportation              Does the Patient Need Case Management to Arrange Discharge Transport? (ex: facility, ambulance, wheelchair/stretcher, Medicaid, cab, other): No  Transportation Name, Phone and Availability #1: lenoard, helbert (Spouse)  [phone] (Mobile)  Support              Support: Pt/Family Updates re:POC or DC Plan, Huddle/team update  Info or Referral              Information or Referral to Community Resources: No Needs Identified  Positive SDOH Domains and Potential Barriers   Positive for SDOH Domain:  (No Needs Identified)               Medication Needs              Medication Needs: No Needs Identified                                                                                                                                          Financial              Financial: No Needs Identified  Legal              Legal: No Needs Identified  Other              Other/None: No needs identified  Discharge Disposition                                                                                                                                                      Selected Continued Care - Admitted Since 12/08/2023       [facility] Coordination complete.      Service Provider Services Address Phone Fax Patient Preferred    [facility] [address], [place] [address] [phone] --              [facility] Coordination complete.      Service Provider Services Address Phone Fax Patient Preferred    [facility] [address], [place] [address] [phone] --                      Humphrey Rolls, RN    Humphrey Rolls, RN BSN  Integrated Nurse Case Manager  Available on [place]  [phone]

## 2023-12-31 NOTE — Progress Notes
 Inpatient Medical Oncology Progress Note    Name:  Daryl Baker   Today's Date:  12/31/2023  Admission Date: 12/08/2023  LOS: 23 days                       ASSESSMENT & PLAN     Principal Problem:    Oropharyngeal mass  Active Problems:    Oropharyngeal cancer (CMS-HCC)    Severe malnutrition    Mouth pain    Drug-induced constipation    Encephalopathy acute    68 y.o. 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 He has had increasing pain over the last few days prior to admission near his oropharyngeal mass on the left along with the left aspect of his tongue preventing any PO intake and has a scheduled G-tube placement.        #SARS-COV-2 URI  On 3/29, developing s/sx concerning for new infection  Fevered AM to 100.8  Tachycardic to 113 at 3 AM  Uptrending leukocytosis, WBC = 13  Nasal congestion & cough  Possible sick contacts (family visitors with similar illnesses around this time)   UA unremarkable  Lactate 1.8  CXR (3/29): unremarkable  RVP: COVID +   PLAN  Follow remainder of infectious work-up  Continue Remdesivir pending placement (3/30- )   Duonebs q6h while awake   Afrin for congestion      #Multiple Brain Infarcts  #Acute Metabolic Encephalopathy (waxing and waning)   Differential Dx: emboli vs watershed infarcts  Low suspicious/excluded: hypoglycemia (BG's normal or elevated), hyponatremia/hypocalcemia/electrolyte abn, no evidence of hypercarbia with normal VBG, thyroid dysfunction (TSH 0.39)   Hx of cisplatin 5 cycles, which can cause encephalopathy/PRES  vEEG: mild encephalopathy  MRI attempted x2 with ativan but patient was unable to tolerate without anesthesia  MRI brain 3/24 : Scattered small acute to subacute bilateral cerebral white matter Infarcts concerning for emboli.  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  PLAN  Daily thiamine supplementation  Scheduled melatonin & trazodone 50mg , continue delirium precautions  Neurology consulted, signed off   ASA 81, optimize BP & diabetes, not consistent with PRES despite cisplatin, likely watershed infarct     #Neutropenic fever- resolved  #Presumed Aspiration pneumonia- resolved  #Sepsis- resolved  Tachycardia, fever, increase in WBC (leukopenic at baseline)  Source likely pulmonary given imaging findings  UA unremarkable  CTA Chest (3/17): Development of a few clustered nodular and patchy opacities within the   right greater than left lower lobes, probably infectious or aspiration-related.   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  PLAN  Trulicity nonformulary, will hold inpatient  Holding Glyburide 2.5 mg daily   Continue metformin 500 BID for hyperglycemia via G-tube     #  Dysphagia   #Oropharyngeal pain  Due to left oropharyngeal cancer  G-tube placed 3/12 AM given significant pain and inability to tolerate PO   Will need the G-tube feeds for >90 days.  PLAN  Magic mouthwash TID PRN for oropharyngeal pain  Consulted Dietician  Transitioned to tube feeds 3/15  Palliative Care consult for pain management as above.   Fentanyl patch 84mcg/hr  Oxycodone 15mg  q4h PRN  Gabapentin 300mg  TID  Bowel regimen with senokot-s 2ts BID, miralax BID  Fluconazole 400mg  qday for oral candidiasis until 4/4 Left HPV-Mediated Oropharyngeal SCC  Currently on chemo and radiation  Follow in Oncology clinic with dr Neita Carp and radiation Oncology Dr Mariane Masters  CT Neck revealed:  1.  Marked decrease size of the prior large left oropharyngeal mass. There   is a residual 1.7 x 1.5 cm lesion involving the left tonsil with central   fluid attenuation. Treated necrotic tumor is the leading consideration. A   post therapeutic retention cyst could appear similar. An abscess is   thought less likely though direct visualization and clinical correlation   is recommended regarding symptoms of infection.   2.  Improvement of bilateral cervical lymphadenopathy.   Radiation Oncology finished radiation while inpatient.(3/17-3/19)  PLAN  Triamcinolone cream for radiation induced dermatitis  Determine further antineoplastic therapy as OP with Dr. Neita Carp      #Pancytopenia   #Neutropenia  ANC <1  Likely due to Chemo & radiation  Follow with daily CBC w/ Diff     Hypertension > Continue to hold metoprolol & losartan    #BUNDLE   FEN:  Bolus tube feeds with free water flushes/Replace electrolytes as needed/Tube feeds  DIET CLEAR LIQUID w/ Body mass index is 32.36 kg/m?Marland Kitchen   DVT PPX:  40mg  qday enoxaparin, SCDs bilat  Code status: Full Code     Pt seen and discussed with my attending, Dr. Sharen Hones, MD, who directed above plan of care.    Oletta Lamas, MD  Internal Medicine Resident, PGY1  Available by [facility] and Mobile paging  ________________________________________________________________________      SUBJECTIVE     No acute overnight events. Daryl Baker is seen and evaluated at bedside. Patient reports a good night's rest. Does think his cough and congestion are improved somewhat. Denies fevers/chills, shortness of breath. Tolerating diet. No specific concerns.      Encouraged to work with PT.     Medications     Scheduled Meds:aspirin chewable tablet 81 mg, 81 mg, PEG Tube, QDAY  Diet Enteral Feeding Bolus, , SEE ADMIN INSTRUCTIONS, 5XDAY (6,10,14,18,22   And  Water Bolus, 100 mL, PEG Tube, 5XDAY (6,10,14,18,22  enoxaparin (LOVENOX) syringe 40 mg, 40 mg, Subcutaneous, QDAY(21)  fentaNYL (DURAGESIC) 50 mcg/hr patch 1 patch, 1 patch, Transdermal, Q72H*   And  Verification of Patch Placement and Integrity - fentaNYL 50 mcg/hr, , Transdermal, BID  fluconazole (DIFLUCAN) oral suspension 200 mg, 200 mg, Oral, QDAY  gabapentin (NEURONTIN) capsule 300 mg, 300 mg, Per G Tube, TID  [Held by Provider] glyBURIDE (DIABETA) tablet 2.5 mg, 2.5 mg, Oral, QDAY  melatonin tablet 10 mg, 10 mg, Oral, QHS  metFORMIN (GLUCOPHAGE) tablet 500 mg, 500 mg, Oral, BID w/meals  polyethylene glycol 3350 (MIRALAX) packet 17 g, 1 packet, Per G Tube, QDAY  remdesivir 100 mg in sodium chloride 0.9% (NS) 100 mL IVPB (MB+), 100 mg, Intravenous, Q24H*  rosuvastatin (CRESTOR) tablet 20 mg, 20 mg, Oral, QHS  sennosides-docusate sodium (SENOKOT-S) tablet 2 tablet, 2 tablet,  Per G Tube, QDAY  thiamine mononitrate (vit B1) tablet 100 mg, 100 mg, PEG Tube, QDAY  traZODone (DESYREL) tablet 75 mg, 75 mg, Oral, QHS  triamcinolone acetonide (KENALOG) 0.1 % topical cream 1 g, 1 g, Topical, QDAY    Continuous Infusions:  PRN and Respiratory Meds:acetaminophen Q6H PRN, bisacodyL QDAY PRN, dextrose 50% (D50) IV PRN, diphenhydrAMINE/lidocaine/mag,al-simeth(#) TID PRN, guaiFENesin  (ROBITUSSIN) oral solution Q4H PRN, ondansetron Q6H PRN **OR** ondansetron (ZOFRAN) IV Q6H PRN, oxyCODONE Q3H PRN, oxymetazoline BID PRN, pancrelipase 20,880 Units/sodium bicarbonate 650 mg ([place] CLOG DESTROYER) PRN (On Call from Rx), phenoL PRN, sodium chloride PRN      Medications Prior to Admission   Medication Sig Dispense Refill Last Dose/Taking   ? dexAMETHasone (DECADRON) 4 mg tablet Beginning 24 hours after each Cisplatin infusion, take 2 tablets by mouth daily for 3 days. 36 tablet 0 12/01/2023   ? FREESTYLE LIBRE 2 READER reader Use one each as directed every 6 hours.   Taking   ? lidocaine hcl viscous (LIDOCAINE VISCOUS) 2 % solution Swish and Spit 5 mL by mouth as directed four times daily as needed. 100 mL 3 Past Week   ? losartan (COZAAR) 50 mg tablet Take two tablets by mouth daily.   12/08/2023 Morning   ? metFORMIN-XR (GLUCOPHAGE XR) 750 mg extended release tablet Take one tablet by mouth daily with dinner.   12/07/2023 Bedtime   ? metoprolol succinate XL (TOPROL XL) 100 mg extended release tablet Take one tablet by mouth daily.   12/08/2023 Morning   ? morphine SR (MS CONTIN) 15 mg tablet Take one tablet by mouth every 12 hours.   Taking   ? [EXPIRED] morphine SR (MS CONTIN) 30 mg ER tablet Take one tablet by mouth every 12 hours for 30 days. Indications: severe chronic pain requiring long-term opioid treatment  Dose adjustment requiring early fill 60 tablet 0 12/08/2023 Morning   ? OLANZapine (ZYPREXA) 5 mg tablet Take 1 tablet by mouth daily at bedtime for 4 days starting on Cisplatin infusion day.  Indications: prevent nausea and vomiting from cancer chemotherapy 24 tablet 0 Taking   ? ondansetron HCL (ZOFRAN) 8 mg tablet Take one tablet by mouth every 8 hours as needed (nausea and vomiting). Indications: prevent nausea and vomiting from cancer chemotherapy 90 tablet 0 Taking As Needed   ? oxyCODONE (ROXICODONE) 5 mg tablet Take one tablet to two tablets by mouth every 4-6 hours as needed for Pain. Indications: pain 120 tablet 0 12/08/2023 Morning   ? triamcinolone acetonide (TRIDERM) 0.1 % topical cream Apply one g topically to affected area twice daily. 80 g 3 Past Week   ? TRULICITY 3 mg/0.5 mL injection pen Inject 0.5 mL under the skin every 7 days. Sunday   12/07/2023 Morning       Allergies     No Known Allergies    OBJECTIVE                            Vital Signs: Last Filed                 Vital Signs: 24 Hour Range   BP: 127/90 (04/02 0312)  Temp: 36.4 ?C (97.5 ?F) (04/02 1610)  Pulse: 96 (04/02 0312)  Respirations: 18 PER MINUTE (04/02 0312)  SpO2: 96 % (04/02 0312)  O2 Device: None (Room air) (04/02 0312) BP: (117-130)/(64-90)   Temp:  [36.4 ?C (97.5 ?F)-37.1 ?C (98.7 ?F)]  Pulse:  [81-100]   Respirations:  [16 PER MINUTE-18 PER MINUTE]   SpO2:  [96 %-98 %]   O2 Device: None (Room air)   Intensity Pain Scale (Self Report): 8 (12/31/23 0313) Vitals:    12/09/23 1715 12/23/23 0752 12/25/23 1242   Weight: 101.5 kg (223 lb 12.8 oz) 101.5 kg (223 lb 12.3 oz) 105.2 kg (232 lb)           Intake/Output Summary: (Last 24 Hours)       Intake/Output Summary (Last 24 hours) at 12/31/2023 0655  Last data filed at 12/31/2023 1610  Gross per 24 hour   Intake 2143 ml   Output 825 ml   Net 1318 ml           Physical Exam     Physical Exam  Vitals and nursing note reviewed.   Constitutional:       General: He is not in acute distress.  HENT:      Head: Normocephalic and atraumatic.      Nose: Nose normal.      Mouth/Throat:      Mouth: Mucous membranes are moist.      Pharynx: Oropharynx is clear.   Eyes:      Extraocular Movements: Extraocular movements intact.   Cardiovascular:      Rate and Rhythm: Normal rate and regular rhythm.   Pulmonary:      Effort: Pulmonary effort is normal. No respiratory distress.   Abdominal:      Palpations: Abdomen is soft.      Comments: G-tube in place without surrounding erythema, edema, bleeding, drainage   Musculoskeletal:      Right lower leg: No edema.      Left lower leg: No edema.   Skin:     General: Skin is warm and dry.   Neurological:      General: No focal deficit present.      Mental Status: He is alert.      Cranial Nerves: No cranial nerve deficit.   Psychiatric:         Mood and Affect: Mood normal.         Behavior: Behavior normal.         Thought Content: Thought content normal.         Judgment: Judgment normal.       Lab Review     24-hour labs:    Results for orders placed or performed during the hospital encounter of 12/08/23 (from the past 24 hours)   POC GLUCOSE    Collection Time: 12/30/23  9:04 AM   Result Value Ref Range    Glucose, POC 213 (H) 70 - 100 mg/dL   POC GLUCOSE Collection Time: 12/30/23 12:14 PM   Result Value Ref Range    Glucose, POC 196 (H) 70 - 100 mg/dL   POC GLUCOSE    Collection Time: 12/30/23  6:10 PM   Result Value Ref Range    Glucose, POC 155 (H) 70 - 100 mg/dL   POC GLUCOSE    Collection Time: 12/30/23  9:19 PM   Result Value Ref Range    Glucose, POC 149 (H) 70 - 100 mg/dL   CBC AND DIFF    Collection Time: 12/31/23  3:18 AM   Result Value Ref Range    White Blood Cells 12.70 (H) 4.50 - 11.00 10*3/uL    Red Blood Cells 2.84 (L) 4.40 - 5.50 10*6/uL    Hemoglobin 9.2 (L) 13.5 -  16.5 g/dL    Hematocrit 09.8 (L) 40.0 - 50.0 %    MCV 95.1 80.0 - 100.0 fL    MCH 32.4 26.0 - 34.0 pg    MCHC 34.1 32.0 - 36.0 g/dL    RDW 11.9 (H) 14.7 - 15.0 %    Platelet Count 402 (H) 150 - 400 10*3/uL    MPV 9.0 7.0 - 11.0 fL   COMPREHENSIVE METABOLIC PANEL    Collection Time: 12/31/23  3:18 AM   Result Value Ref Range    Sodium 136 (L) 137 - 147 mmol/L    Potassium 4.7 3.5 - 5.1 mmol/L    Chloride 98 98 - 110 mmol/L    Glucose 91 70 - 100 mg/dL    Blood Urea Nitrogen 16 7 - 25 mg/dL    Creatinine 8.29 5.62 - 1.24 mg/dL    Calcium 8.2 (L) 8.5 - 10.6 mg/dL    Total Protein 6.3 6.0 - 8.0 g/dL    Total Bilirubin 0.3 0.2 - 1.3 mg/dL    Albumin 3.4 (L) 3.5 - 5.0 g/dL    Alk Phosphatase 80 25 - 110 U/L    AST 18 7 - 40 U/L    ALT 11 7 - 56 U/L    CO2 28 21 - 30 mmol/L    Anion Gap 10 3 - 12    Glomerular Filtration Rate (GFR) >60 >60 mL/min   MAGNESIUM    Collection Time: 12/31/23  3:18 AM   Result Value Ref Range    Magnesium 2.1 1.6 - 2.6 mg/dL   MANUAL DIFF    Collection Time: 12/31/23  3:18 AM   Result Value Ref Range    ANISO Present     POLY Present     Platelet Estimate Slight Increase     Segmented Neutrophils 69 41 - 77 %    Lymphocytes 6 (L) 24 - 44 %    Monocytes 17 (H) 4 - 12 %    Metamyelocyte 5 %    Myelocyte 3 %    Absolute Neutrophil Count Manual 8.8 (H) 1.8 - 7.0 10*3/uL    nRBCs (Manual Diff) 1 10*3/uL       Point of Care Testing  (Last 24 hours)  Glucose: 91 (12/31/23 0318)  POC Glucose (Download): (!) 149 (12/30/23 2119)      Radiology and Other Diagnostic Review      Pertinent radiology reviewed.

## 2023-12-31 NOTE — Progress Notes
 RT Adult Assessment Note    NAME:Sigismund Tahjae Clausing             MRN: [id number]             DOB:1956/05/02          AGE: 68 y.o.  ADMISSION DATE: 12/08/2023             DAYS ADMITTED: LOS: 23 days    Additional Comments:  Impressions of the patient: resting comfortably on RA  Intervention(s)/outcome(s): IS ordered  Patient education that was completed: none  Recommendations to the care team: none    Vital Signs:  Pulse: 84  RR: 18 PER MINUTE  SpO2: 97 %  O2 Device: None (Room air)  Liter Flow:    O2%:      Breath Sounds:   All Breath Sounds: Decreased  Respiratory Effort:   Respiratory WDL: Within Defined Limits  Comments: IS ordered

## 2023-12-31 NOTE — Progress Notes
 I have performed, reperformed, and/or observed the notes, assessments and procedures performed by [employer]in [employer]ing [place] [employer] student nurse.  I was personally present on this date of service 12/31/23 and concur with the nursing student's documentation unless otherwise noted.

## 2024-01-01 ENCOUNTER — Inpatient Hospital Stay: Admit: 2024-01-01 | Discharge: 2024-01-01

## 2024-01-01 ENCOUNTER — Encounter: Admit: 2024-01-01 | Discharge: 2024-01-01

## 2024-01-01 LAB — POC GLUCOSE
~~LOC~~ BKR POC GLUCOSE: 208 mg/dL — ABNORMAL HIGH (ref 70–100)
~~LOC~~ BKR POC GLUCOSE: 123 mg/dL — ABNORMAL HIGH (ref 70–100)
~~LOC~~ BKR POC GLUCOSE: 206 mg/dL — ABNORMAL HIGH (ref 70–100)
~~LOC~~ BKR POC GLUCOSE: 242 mg/dL — ABNORMAL HIGH (ref 70–100)

## 2024-01-01 LAB — MANUAL DIFF
~~LOC~~ BKR BANDS - RELATIVE: 2 % (ref 0–10)
~~LOC~~ BKR BASOPHILS - RELATIVE: 1 % (ref 0–2)
~~LOC~~ BKR LYMPHOCYTES - RELATIVE: 3 % — ABNORMAL LOW (ref 24–44)
~~LOC~~ BKR METAMYELOCYTES - RELATIVE: 2 %
~~LOC~~ BKR MONOCYTES - RELATIVE: 25 % — ABNORMAL HIGH (ref 4–12)
~~LOC~~ BKR MYELOCYTES - RELATIVE: 1 %
~~LOC~~ BKR NEUT+BANDS - ABSOLUTE: 7.6 10*3/uL — ABNORMAL HIGH (ref 1.8–7.0)
~~LOC~~ BKR NEUTROPHILS - RELATIVE: 66 % (ref 41–77)

## 2024-01-01 LAB — CULTURE-BLOOD W/SENSITIVITY

## 2024-01-01 MED ORDER — FLUCONAZOLE 40 MG/ML PO SUSR
200 mg | Freq: Every day | ORAL | 0 refills | Status: CN
Start: 2024-01-01 — End: ?

## 2024-01-01 MED ORDER — SODIUM CHLORIDE 0.65 % NA SPRA
2 | NASAL | 0 refills | Status: DC | PRN
Start: 2024-01-01 — End: 2024-01-02

## 2024-01-01 MED ORDER — DIATRIZOATE MEG-DIATRIZOAT SOD 66-10 % PO SOLN
30 mL | Freq: Once | GASTROSTOMY | 0 refills | Status: CP
Start: 2024-01-01 — End: ?
  Administered 2024-01-01: 19:00:00 30 mL via GASTROSTOMY

## 2024-01-01 NOTE — Case Management (ED)
 Patient Engineer, agricultural    Received request from Gray Bernhardt, [facility] to get price quotes for wheelchair van vs cab pass to transfer pt to home address on file. [facility] will pay.     Cab pass: $254.20   [employer] [phone]: $606.00 - available today    Brought cab pass to pt bedside RN who stated pt likely not leaving today.    Calla Kicks  Case Management Assistant  For further assistance please contact SWCM

## 2024-01-01 NOTE — Progress Notes
 Plan:  SNF    Intervention:  SW notified by Randa Lynn RN pt's spouse is contacting her regarding pt's discharge from the hospital as pt has no where to go.  Spouse thinks pt needs to be in a SNF.     SW reviewed pt's chart.  SW contacted inpt SW [facility].  Inpt SW explained situation and reports pt is appropriate for discharge home w/ home health.  Pt is medical stable and ambulatory.  PT/OT is regarding pt discharge home. Pt's insurance is denying placement as pt does not meet criteria for placement.     SW updated [employer] fundraiser.     Samantha Crimes, LMSW

## 2024-01-01 NOTE — Progress Notes
 PHYSICAL THERAPY  NOTE      Name: Daryl Baker   MRN: [id number]     DOB: Sep 14, 1956      Age: 68 y.o.  Admission Date: 12/08/2023     LOS: 24 days     Date of Service: 01/01/2024      Patient was unavailable for Physical Therapy. Pt in IR for procedure. Rehabilitation services will continue to follow and provide intervention as indicated.    Therapist: Lauretta Chester, PTA  Date: 01/01/2024

## 2024-01-01 NOTE — Progress Notes
 Patient arrives to [facility] minor procedure room to have G-tube assessed and exchanged.  Site and focused assessment completed.  G-tube exchanged by Aubery Lapping APRN without complication.  G-tube placement confirmed by contrasted AP/Lat ABD xray read by Dr. Windell Moulding.  Discharge instructions and basic g tube instructions reviewed with patient.  G tube okay for use as directed at this time.

## 2024-01-01 NOTE — Telephone Encounter
 Received call from patient's wife stating patient is currently intake and they are trying to discharge patient home today. Wife sounded concerned on the VM and asked for a return call. RN called and spoke with wife. States he is getting chemo and radiation. Had a stroke and has been hospitalized. States she feels like he is not ready to come home. His cognition is still not 100% and wife is concerned about his safety while being home. States they live in a two story home. Patient lives on the first level as their bathroom accomodates patient better. States herself and their son, who has muscular dystrophy, lives on the second level. States she is both of their caregivers. Also mentioned that both her and the patient has Covid. Asking if there's anything we can do to delay his discharge. RN informed wife that unfortunately, we are outpatient and do not have a say on what inpatient does. Asked if wife spoke to inpatient SW or Case Management. Wife states yes, she has spoke with them multiple times. States they live 110 miles away and they are trying to find transportation for patient to get home after discharge. Wife is concerned and asked if there's anything we can do to help.   RN discussed case with outpatient SW who gave RN contact information for [facility]. RN called and spoke with Adelina Mings, intake for [facility] and explained situation. RN also contacted inpatient SW, Victorino Dike, to get more information. After speaking with Victorino Dike, patient does not qualify for rehab/skilled nursing and will be discharged home today.

## 2024-01-01 NOTE — Progress Notes
 SPEECH-LANGUAGE PATHOLOGY  NO TREATMENT NOTE     Attempted to complete ongoing intervention related to dysphagia. Pt is off unit in IR at this time. This department will continue to follow for intervention as patient is available/able to participate.        Therapist: Gypsy Decant, M.S. CCC-SLP  Date: 01/01/2024

## 2024-01-01 NOTE — Telephone Encounter
 Received page from wife, Bonita Quin, asking for a call back. Pt is currently admitted. Bonita Quin states pt is being discharged today and feels that he needs to be admitted to a skilled nursing facility but insurance denied this. Explained to Bonita Quin that she will need to speak with the in patient social worker and they should be able to assist. Reached out to our social worker JD to see if she could assist but unfortunately she cannot since pt is currently admitted. She will send a message to the inpatient team.

## 2024-01-01 NOTE — Unmapped
 Immediate Post Procedure Note    Date:  01/01/2024                                                                         Attending Physician:   Carlis Stable, MD   Performing Provider:  Philmore Pali, APRN-NP    Procedure(s):  Gtube exchange  Pre/Post procedure diagnosis:  Broken tube   Findings:  18 Fr gtube placed via patent tract  Anesthesia: None  Sedation/Medication Plan: None    Time out performed: Consent obtained, correct patient verified, correct procedure verified, correct site verified, patient marked as necessary.  Estimated Blood Loss:  None/Negligible  Specimen(s) Removed/Disposition:  None  Complications: None  Post-Procedure Condition: Stable  Comments: KUB with gastrografin pending for placement verification     Philmore Pali, APRN-NP  Pgr [phone]

## 2024-01-01 NOTE — Progress Notes
 Inpatient Medical Oncology Progress Note    Name:  Daryl Baker   Today's Date:  01/01/2024  Admission Date: 12/08/2023  LOS: 24 days                       ASSESSMENT & PLAN     Principal Problem:    Oropharyngeal mass  Active Problems:    Oropharyngeal cancer (CMS-HCC)    Severe malnutrition    Mouth pain    Drug-induced constipation    Encephalopathy acute    68 y.o. 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 He has had increasing pain over the last few days prior to admission near his oropharyngeal mass on the left along with the left aspect of his tongue preventing any PO intake and has a scheduled G-tube placement.        SARS-COV-2 URI  On 3/29, developing s/sx concerning for new infection  Fevered AM to 100.8  Tachycardic to 113 at 3 AM  Uptrending leukocytosis, WBC = 13  Nasal congestion & cough  Possible sick contacts (family visitors with similar illnesses around this time)   UA unremarkable  Lactate 1.8  CXR (3/29): unremarkable  RVP: COVID +   PLAN  Continue Remdesivir (3/30-4/3)   SeaMist for congestion (had to DC afrin for >3 days therapy)     Multiple Brain Infarcts  Acute Metabolic Encephalopathy (waxing and waning)   Differential Dx: emboli vs watershed infarcts  Low suspicious/excluded: hypoglycemia (BG's normal or elevated), hyponatremia/hypocalcemia/electrolyte abn, no evidence of hypercarbia with normal VBG, thyroid dysfunction (TSH 0.39)   Hx of cisplatin 5 cycles, which can cause encephalopathy/PRES  vEEG: mild encephalopathy  MRI attempted x2 with ativan but patient was unable to tolerate without anesthesia  MRI brain 3/24 : Scattered small acute to subacute bilateral cerebral white matter Infarcts concerning for emboli.  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  PLAN  Trulicity nonformulary, will hold inpatient  Continue metformin 500 BID for hyperglycemia via G-tube     Dysphagia   Oropharyngeal pain  Due to left oropharyngeal cancer  G-tube placed 3/12 AM given significant pain and inability to tolerate PO   Will need the G-tube feeds for >90 days.  PLAN  Magic mouthwash TID PRN for oropharyngeal pain  Consulted Dietician  Transitioned to tube feeds 3/15  Palliative Care consult for pain management as above.   Fentanyl patch 24mcg/hr  Oxycodone 15mg  q4h PRN  Gabapentin 300mg  TID  Bowel regimen with senokot-s 2ts BID, miralax BID  Fluconazole 400mg  qday for oral candidiasis until 4/4                 Left HPV-Mediated Oropharyngeal SCC  Currently on chemo and radiation  Follow in Oncology clinic with dr Neita Carp and radiation Oncology Dr Mariane Masters  CT Neck revealed:  1.  Marked decrease size of the prior large left oropharyngeal mass. There   is a residual 1.7 x 1.5 cm lesion involving the left tonsil with central   fluid attenuation. Treated necrotic tumor is the leading consideration. A   post therapeutic retention cyst could appear similar. An abscess is thought less likely though direct visualization and clinical correlation   is recommended regarding symptoms of infection.   2.  Improvement of bilateral cervical lymphadenopathy.   Radiation Oncology finished radiation while inpatient.(3/17-3/19)  PLAN  Triamcinolone cream for radiation induced dermatitis  Determine further antineoplastic therapy as OP with Dr. Neita Carp      Normocytic anemia  Thrombocytopenia- resolved  Neutropenia- resolved  ANC <1  Likely due to Chemo & radiation  Follow with daily CBC w/ Diff  Needs anemia worked up outpatient    Neutropenic fever- resolved  Presumed Aspiration pneumonia- resolved  Sepsis- resolved  Tachycardia, fever, increase in WBC (leukopenic at baseline)  Source likely pulmonary given imaging findings  UA unremarkable  CTA Chest (3/17): Development of a few clustered nodular and patchy opacities within the   right greater than left lower lobes, probably infectious or aspiration-related.   MRSA nares neg  Completed Zosyn (3/17-3/23)     Hypertension > Continue to hold metoprolol & losartan    BUNDLE   FEN:  Bolus tube feeds with free water flushes/Replace electrolytes as needed/Tube feeds  DIET CLEAR LIQUID w/ Body mass index is 32.36 kg/m?Marland Kitchen   DVT PPX:  40mg  qday enoxaparin, SCDs bilat  Code status: Full Code     Patient seen and case discussed with Dr. Beckie Salts, DO  Internal Medicine Resident PGY3  Available on Voalte  ________________________________________________________________________      SUBJECTIVE     Patient seen and examined at bedside this morning. He is doing well, thankful to be on his last day of remdesivir. He feels his breathing is doing well. G-tube to be exchanged today d/t broken adapter. No acute overnight events.     Medications     Scheduled Meds:[Transfer Hold] aspirin chewable tablet 81 mg, 81 mg, PEG Tube, QDAY  [Transfer Hold] Diet Enteral Feeding Bolus, , SEE ADMIN INSTRUCTIONS, 5XDAY (6,10,14,18,22   And  [Transfer Hold] Water Bolus, 100 mL, PEG Tube, 5XDAY (6,10,14,18,22  [Transfer Hold] enoxaparin (LOVENOX) syringe 40 mg, 40 mg, Subcutaneous, QDAY(21)  [Transfer Hold] fentaNYL (DURAGESIC) 50 mcg/hr patch 1 patch, 1 patch, Transdermal, Q72H*   And  [Transfer Hold] Verification of Patch Placement and Integrity - fentaNYL 50 mcg/hr, , Transdermal, BID  fluconazole (DIFLUCAN) oral suspension 200 mg, 200 mg, Oral, QDAY  gabapentin (NEURONTIN) capsule 300 mg, 300 mg, Per G Tube, TID  [Transfer Hold] melatonin tablet 10 mg, 10 mg, Oral, QHS  [Transfer Hold] metFORMIN (GLUCOPHAGE) tablet 500 mg, 500 mg, Oral, BID w/meals  [Transfer Hold] polyethylene glycol 3350 (MIRALAX) packet 17 g, 1 packet, Per G Tube, QDAY  [Transfer Hold] rosuvastatin (CRESTOR) tablet 20 mg, 20 mg, Oral, QHS  [Transfer Hold] sennosides-docusate sodium (SENOKOT-S) tablet 2 tablet, 2 tablet, Per G Tube, QDAY  [Transfer Hold] thiamine mononitrate (vit  B1) tablet 100 mg, 100 mg, PEG Tube, QDAY  [Transfer Hold] traZODone (DESYREL) tablet 75 mg, 75 mg, Oral, QHS  [Transfer Hold] triamcinolone acetonide (KENALOG) 0.1 % topical cream 1 g, 1 g, Topical, QDAY    Continuous Infusions:  PRN and Respiratory Meds:[Transfer Hold] acetaminophen Q6H PRN, [Transfer Hold] bisacodyL QDAY PRN, dextrose 50% (D50) IV PRN, [Transfer Hold] diphenhydrAMINE/lidocaine/mag,al-simeth(#) TID PRN, [Transfer Hold] guaiFENesin  (ROBITUSSIN) oral solution Q4H PRN, [Transfer Hold] ondansetron Q6H PRN **OR** [Transfer Hold] ondansetron (ZOFRAN) IV Q6H PRN, [Transfer Hold] oxyCODONE Q3H PRN, [Transfer Hold] pancrelipase 20,880 Units/sodium bicarbonate 650 mg ([place] CLOG DESTROYER) PRN (On Call from Rx), [Transfer Hold] phenoL PRN, [Transfer Hold] sodium chloride Q6H PRN      Medications Prior to Admission   Medication Sig Dispense Refill Last Dose/Taking   ? dexAMETHasone (DECADRON) 4 mg tablet Beginning 24 hours after each Cisplatin infusion, take 2 tablets by mouth daily for 3 days. 36 tablet 0 12/01/2023   ? FREESTYLE LIBRE 2 READER reader Use one each as directed every 6 hours.   Taking   ? lidocaine hcl viscous (LIDOCAINE VISCOUS) 2 % solution Swish and Spit 5 mL by mouth as directed four times daily as needed. 100 mL 3 Past Week   ? losartan (COZAAR) 50 mg tablet Take two tablets by mouth daily.   12/08/2023 Morning   ? metFORMIN-XR (GLUCOPHAGE XR) 750 mg extended release tablet Take one tablet by mouth daily with dinner.   12/07/2023 Bedtime   ? metoprolol succinate XL (TOPROL XL) 100 mg extended release tablet Take one tablet by mouth daily.   12/08/2023 Morning   ? morphine SR (MS CONTIN) 15 mg tablet Take one tablet by mouth every 12 hours.   Taking   ? [EXPIRED] morphine SR (MS CONTIN) 30 mg ER tablet Take one tablet by mouth every 12 hours for 30 days. Indications: severe chronic pain requiring long-term opioid treatment  Dose adjustment requiring early fill 60 tablet 0 12/08/2023 Morning   ? OLANZapine (ZYPREXA) 5 mg tablet Take 1 tablet by mouth daily at bedtime for 4 days starting on Cisplatin infusion day.  Indications: prevent nausea and vomiting from cancer chemotherapy 24 tablet 0 Taking   ? ondansetron HCL (ZOFRAN) 8 mg tablet Take one tablet by mouth every 8 hours as needed (nausea and vomiting). Indications: prevent nausea and vomiting from cancer chemotherapy 90 tablet 0 Taking As Needed   ? oxyCODONE (ROXICODONE) 5 mg tablet Take one tablet to two tablets by mouth every 4-6 hours as needed for Pain. Indications: pain 120 tablet 0 12/08/2023 Morning   ? triamcinolone acetonide (TRIDERM) 0.1 % topical cream Apply one g topically to affected area twice daily. 80 g 3 Past Week   ? TRULICITY 3 mg/0.5 mL injection pen Inject 0.5 mL under the skin every 7 days. Sunday   12/07/2023 Morning       Allergies     No Known Allergies    OBJECTIVE                            Vital Signs: Last Filed                 Vital Signs: 24 Hour Range   BP: 112/64 (04/03 1217)  Temp: 36.7 ?C (98.1 ?F) (04/03 1217)  Pulse: 95 (04/03 1217)  Respirations: 18 PER MINUTE (04/03 1217)  SpO2: 94 % (04/03 1217)  O2 Device: None (Room air) (04/03 1217)  BP: (112-133)/(60-67)   Temp:  [36.3 ?C (97.4 ?F)-37.2 ?C (99 ?F)]   Pulse:  [75-100]   Respirations:  [18 PER MINUTE-20 PER MINUTE]   SpO2:  [94 %-98 %]   O2 Device: None (Room air)   Intensity Pain Scale (Self Report): 8 (01/01/24 1022) Vitals:    12/09/23 1715 12/23/23 0752 12/25/23 1242   Weight: 101.5 kg (223 lb 12.8 oz) 101.5 kg (223 lb 12.3 oz) 105.2 kg (232 lb)           Intake/Output Summary: (Last 24 Hours)       Intake/Output Summary (Last 24 hours) at 01/01/2024 1410  Last data filed at 01/01/2024 1020  Gross per 24 hour   Intake 864 ml   Output 765 ml   Net 99 ml           Physical Exam     Physical Exam  Vitals reviewed.   Constitutional:       General: He is not in acute distress.     Appearance: Normal appearance. He is not ill-appearing.   HENT:      Head: Normocephalic and atraumatic.      Nose: Nose normal.   Eyes:      Conjunctiva/sclera: Conjunctivae normal.   Cardiovascular:      Rate and Rhythm: Normal rate and regular rhythm.      Heart sounds: Normal heart sounds. No murmur heard.  Pulmonary:      Effort: Pulmonary effort is normal. No respiratory distress.      Breath sounds: Normal breath sounds. No wheezing.   Abdominal:      Comments: G-tube in place without surrounding erythema, edema, bleeding, drainage   Skin:     General: Skin is warm and dry.   Neurological:      Mental Status: He is alert. Mental status is at baseline.   Psychiatric:         Mood and Affect: Mood normal.         Thought Content: Thought content normal.       Lab Review     24-hour labs:    Results for orders placed or performed during the hospital encounter of 12/08/23 (from the past 24 hours)   POC GLUCOSE    Collection Time: 12/31/23  3:25 PM   Result Value Ref Range    Glucose, POC 171 (H) 70 - 100 mg/dL   POC GLUCOSE    Collection Time: 12/31/23  9:43 PM   Result Value Ref Range    Glucose, POC 208 (H) 70 - 100 mg/dL   CBC AND DIFF    Collection Time: 01/01/24  3:39 AM   Result Value Ref Range    White Blood Cells 11.20 (H) 4.50 - 11.00 10*3/uL    Red Blood Cells 2.70 (L) 4.40 - 5.50 10*6/uL    Hemoglobin 8.8 (L) 13.5 - 16.5 g/dL    Hematocrit 16.1 (L) 40.0 - 50.0 %    MCV 94.2 80.0 - 100.0 fL    MCH 32.5 26.0 - 34.0 pg    MCHC 34.5 32.0 - 36.0 g/dL    RDW 09.6 (H) 04.5 - 15.0 %    Platelet Count 386 150 - 400 10*3/uL    MPV 9.3 7.0 - 11.0 fL   COMPREHENSIVE METABOLIC PANEL    Collection Time: 01/01/24  3:39 AM   Result Value Ref Range    Sodium 137 137 - 147 mmol/L    Potassium 4.9 3.5 - 5.1  mmol/L    Chloride 100 98 - 110 mmol/L    Glucose 138 (H) 70 - 100 mg/dL    Blood Urea Nitrogen 15 7 - 25 mg/dL    Creatinine 1.61 0.96 - 1.24 mg/dL    Calcium 8.3 (L) 8.5 - 10.6 mg/dL    Total Protein 5.7 (L) 6.0 - 8.0 g/dL    Total Bilirubin 0.2 0.2 - 1.3 mg/dL    Albumin 3.2 (L) 3.5 - 5.0 g/dL    Alk Phosphatase 65 25 - 110 U/L    AST 20 7 - 40 U/L    ALT 10 7 - 56 U/L    CO2 30 21 - 30 mmol/L    Anion Gap 7 3 - 12    Glomerular Filtration Rate (GFR) >60 >60 mL/min   MAGNESIUM    Collection Time: 01/01/24  3:39 AM   Result Value Ref Range    Magnesium 2.2 1.6 - 2.6 mg/dL   MANUAL DIFF    Collection Time: 01/01/24  3:39 AM   Result Value Ref Range    ANISO Present     POLY Present     Platelet Estimate Normal     Segmented Neutrophils 66 41 - 77 %    Lymphocytes 3 (L) 24 - 44 %    Monocytes 25 (H) 4 - 12 %    Metamyelocyte 2 %    Myelocyte 1 %    Bands 2 0 - 10 %    Basophil 1 0 - 2 %    Absolute Neutrophil Count Manual 7.6 (H) 1.8 - 7.0 10*3/uL   POC GLUCOSE    Collection Time: 01/01/24  8:54 AM   Result Value Ref Range    Glucose, POC 206 (H) 70 - 100 mg/dL   POC GLUCOSE    Collection Time: 01/01/24 12:26 PM   Result Value Ref Range    Glucose, POC 123 (H) 70 - 100 mg/dL       Point of Care Testing  (Last 24 hours)  Glucose: (!) 138 (01/01/24 0339)  POC Glucose (Download): (!) 123 (01/01/24 1226)      Radiology and Other Diagnostic Review      Pertinent radiology reviewed.

## 2024-01-01 NOTE — Progress Notes
 Pt crushed medication and administered via G-tube with verbal prompting from RN. Pt required minimal assistance from RN to unscrew EN fit syringe from G-tube hub.

## 2024-01-02 ENCOUNTER — Encounter: Admit: 2024-01-02 | Discharge: 2024-01-02

## 2024-01-02 ENCOUNTER — Inpatient Hospital Stay: Admit: 2023-12-08 | Discharge: 2024-01-02 | Disposition: A | Discharge status: Home Health | Payer: MEDICARE

## 2024-01-02 DIAGNOSIS — Z809 Family history of malignant neoplasm, unspecified: Secondary | ICD-10-CM

## 2024-01-02 DIAGNOSIS — E871 Hypo-osmolality and hyponatremia: Secondary | ICD-10-CM

## 2024-01-02 DIAGNOSIS — G9341 Metabolic encephalopathy: Secondary | ICD-10-CM

## 2024-01-02 DIAGNOSIS — E1144 Type 2 diabetes mellitus with diabetic amyotrophy: Secondary | ICD-10-CM

## 2024-01-02 DIAGNOSIS — R682 Dry mouth, unspecified: Secondary | ICD-10-CM

## 2024-01-02 DIAGNOSIS — D709 Neutropenia, unspecified: Secondary | ICD-10-CM

## 2024-01-02 DIAGNOSIS — M779 Enthesopathy, unspecified: Secondary | ICD-10-CM

## 2024-01-02 DIAGNOSIS — Z9221 Personal history of antineoplastic chemotherapy: Secondary | ICD-10-CM

## 2024-01-02 DIAGNOSIS — D61810 Antineoplastic chemotherapy induced pancytopenia: Secondary | ICD-10-CM

## 2024-01-02 DIAGNOSIS — Z823 Family history of stroke: Secondary | ICD-10-CM

## 2024-01-02 DIAGNOSIS — J189 Pneumonia, unspecified organism: Secondary | ICD-10-CM

## 2024-01-02 DIAGNOSIS — G4700 Insomnia, unspecified: Secondary | ICD-10-CM

## 2024-01-02 DIAGNOSIS — Z8249 Family history of ischemic heart disease and other diseases of the circulatory system: Secondary | ICD-10-CM

## 2024-01-02 DIAGNOSIS — J069 Acute upper respiratory infection, unspecified: Secondary | ICD-10-CM

## 2024-01-02 DIAGNOSIS — E43 Unspecified severe protein-calorie malnutrition: Secondary | ICD-10-CM

## 2024-01-02 DIAGNOSIS — I4510 Unspecified right bundle-branch block: Secondary | ICD-10-CM

## 2024-01-02 DIAGNOSIS — I959 Hypotension, unspecified: Secondary | ICD-10-CM

## 2024-01-02 DIAGNOSIS — Z7409 Other reduced mobility: Secondary | ICD-10-CM

## 2024-01-02 DIAGNOSIS — F411 Generalized anxiety disorder: Secondary | ICD-10-CM

## 2024-01-02 DIAGNOSIS — L598 Other specified disorders of the skin and subcutaneous tissue related to radiation: Secondary | ICD-10-CM

## 2024-01-02 DIAGNOSIS — J690 Pneumonitis due to inhalation of food and vomit: Secondary | ICD-10-CM

## 2024-01-02 DIAGNOSIS — E8809 Other disorders of plasma-protein metabolism, not elsewhere classified: Secondary | ICD-10-CM

## 2024-01-02 DIAGNOSIS — T451X5A Adverse effect of antineoplastic and immunosuppressive drugs, initial encounter: Secondary | ICD-10-CM

## 2024-01-02 DIAGNOSIS — U071 COVID-19: Secondary | ICD-10-CM

## 2024-01-02 DIAGNOSIS — Z7985 Long-term (current) use of injectable non-insulin antidiabetic drugs: Secondary | ICD-10-CM

## 2024-01-02 DIAGNOSIS — R1312 Dysphagia, oropharyngeal phase: Secondary | ICD-10-CM

## 2024-01-02 DIAGNOSIS — Z515 Encounter for palliative care: Secondary | ICD-10-CM

## 2024-01-02 DIAGNOSIS — Z79899 Other long term (current) drug therapy: Secondary | ICD-10-CM

## 2024-01-02 DIAGNOSIS — B370 Candidal stomatitis: Secondary | ICD-10-CM

## 2024-01-02 DIAGNOSIS — Z87891 Personal history of nicotine dependence: Secondary | ICD-10-CM

## 2024-01-02 DIAGNOSIS — Z8489 Family history of other specified conditions: Secondary | ICD-10-CM

## 2024-01-02 DIAGNOSIS — Z833 Family history of diabetes mellitus: Secondary | ICD-10-CM

## 2024-01-02 DIAGNOSIS — Z923 Personal history of irradiation: Secondary | ICD-10-CM

## 2024-01-02 DIAGNOSIS — I639 Cerebral infarction, unspecified: Secondary | ICD-10-CM

## 2024-01-02 DIAGNOSIS — A419 Sepsis, unspecified organism: Secondary | ICD-10-CM

## 2024-01-02 DIAGNOSIS — Z7984 Long term (current) use of oral hypoglycemic drugs: Secondary | ICD-10-CM

## 2024-01-02 DIAGNOSIS — L309 Dermatitis, unspecified: Secondary | ICD-10-CM

## 2024-01-02 DIAGNOSIS — R5081 Fever presenting with conditions classified elsewhere: Secondary | ICD-10-CM

## 2024-01-02 DIAGNOSIS — E876 Hypokalemia: Secondary | ICD-10-CM

## 2024-01-02 DIAGNOSIS — K5900 Constipation, unspecified: Secondary | ICD-10-CM

## 2024-01-02 DIAGNOSIS — Z801 Family history of malignant neoplasm of trachea, bronchus and lung: Secondary | ICD-10-CM

## 2024-01-02 DIAGNOSIS — R000 Tachycardia, unspecified: Secondary | ICD-10-CM

## 2024-01-02 DIAGNOSIS — Z6831 Body mass index (BMI) 31.0-31.9, adult: Secondary | ICD-10-CM

## 2024-01-02 DIAGNOSIS — K5903 Drug induced constipation: Secondary | ICD-10-CM

## 2024-01-02 LAB — MANUAL DIFF
~~LOC~~ BKR BANDS - RELATIVE: 2 % (ref 0–10)
~~LOC~~ BKR LYMPHOCYTES - RELATIVE: 3 % — ABNORMAL LOW (ref 24–44)
~~LOC~~ BKR METAMYELOCYTES - RELATIVE: 3 %
~~LOC~~ BKR MONOCYTES - RELATIVE: 19 % — ABNORMAL HIGH (ref 4–12)
~~LOC~~ BKR NEUT+BANDS - ABSOLUTE: 10 10*3/uL — ABNORMAL HIGH (ref 1.8–7.0)
~~LOC~~ BKR NEUTROPHILS - RELATIVE: 73 % (ref 41–77)

## 2024-01-02 LAB — POC GLUCOSE: ~~LOC~~ BKR POC GLUCOSE: 121 mg/dL — ABNORMAL HIGH (ref 70–100)

## 2024-01-02 MED ORDER — FAMOTIDINE (PF) 20 MG/2 ML IV SOLN
20 mg | Freq: Once | INTRAVENOUS | 0 refills | Status: DC | PRN
Start: 2024-01-02 — End: 2024-01-02

## 2024-01-02 MED ORDER — ALBUTEROL SULFATE 2.5 MG /3 ML (0.083 %) IN NEBU
2.5 mg | Freq: Once | RESPIRATORY_TRACT | 0 refills | Status: DC | PRN
Start: 2024-01-02 — End: 2024-01-02

## 2024-01-02 MED ORDER — POLYETHYLENE GLYCOL 3350 17 GRAM PO PWPK
17 g | PACK | Freq: Every day | GASTROSTOMY | 0 refills | Status: CN
Start: 2024-01-02 — End: ?

## 2024-01-02 MED ORDER — SODIUM CHLORIDE 0.65 % NA SPRA
2 | NASAL | 11 refills | Status: CN | PRN
Start: 2024-01-02 — End: ?

## 2024-01-02 MED ORDER — OXYCODONE 5 MG PO TAB
5-15 mg | ORAL_TABLET | GASTROSTOMY | 0 refills | 6.00000 days | Status: AC | PRN
Start: 2024-01-02 — End: ?
  Filled 2024-01-02: qty 120, 10d supply, fill #1

## 2024-01-02 MED ORDER — FENTANYL 50 MCG/HR TD PT72
1 | MEDICATED_PATCH | TRANSDERMAL | 0 refills | Status: AC
Start: 2024-01-02 — End: ?
  Filled 2024-01-02: qty 10, 30d supply, fill #1

## 2024-01-02 MED ORDER — EPINEPHRINE 1 MG/ML (1 ML) IJ SOLN
.3 mg | INTRAMUSCULAR | 0 refills | Status: DC | PRN
Start: 2024-01-02 — End: 2024-01-02

## 2024-01-02 MED ORDER — METFORMIN 500 MG PO TAB
500 mg | ORAL_TABLET | Freq: Two times a day (BID) | GASTROSTOMY | 0 refills | Status: AC
Start: 2024-01-02 — End: ?
  Filled 2024-01-02: qty 60, 30d supply, fill #1

## 2024-01-02 MED ORDER — MELATONIN 10 MG PO TAB
10 mg | ORAL_TABLET | Freq: Every evening | GASTROSTOMY | 0 refills | Status: CN
Start: 2024-01-02 — End: ?

## 2024-01-02 MED ORDER — SENNOSIDES-DOCUSATE SODIUM 8.6-50 MG PO TAB
2 | ORAL_TABLET | Freq: Every day | GASTROSTOMY | 0 refills | Status: CN
Start: 2024-01-02 — End: ?

## 2024-01-02 MED ORDER — METHYLPREDNISOLONE SOD SUC(PF) 125 MG/2 ML IJ SOLR
125 mg | Freq: Once | INTRAVENOUS | 0 refills | Status: DC | PRN
Start: 2024-01-02 — End: 2024-01-02

## 2024-01-02 MED ORDER — SODIUM CHLORIDE 0.9% IV SOLP
500 mL | Freq: Once | INTRAVENOUS | 0 refills | Status: DC | PRN
Start: 2024-01-02 — End: 2024-01-02

## 2024-01-02 MED ORDER — TRAZODONE 150 MG PO TAB
75 mg | ORAL_TABLET | Freq: Every evening | GASTROSTOMY | 0 refills | Status: AC
Start: 2024-01-02 — End: ?
  Filled 2024-01-02: qty 15, 30d supply, fill #1

## 2024-01-02 MED ORDER — THIAMINE MONONITRATE (VIT B1) 100 MG PO TAB
100 mg | ORAL_TABLET | Freq: Every day | GASTROSTOMY | 0 refills | Status: CN
Start: 2024-01-02 — End: ?

## 2024-01-02 MED ORDER — GABAPENTIN 300 MG PO CAP
300 mg | ORAL_CAPSULE | Freq: Three times a day (TID) | GASTROSTOMY | 0 refills | Status: AC
Start: 2024-01-02 — End: ?
  Filled 2024-01-02: qty 90, 30d supply, fill #1

## 2024-01-02 MED ORDER — ASPIRIN 81 MG PO CHEW
81 mg | ORAL_TABLET | Freq: Every day | GASTROSTOMY | 0 refills | Status: CN
Start: 2024-01-02 — End: ?

## 2024-01-02 MED ORDER — ROSUVASTATIN 20 MG PO TAB
20 mg | ORAL_TABLET | Freq: Every evening | GASTROSTOMY | 0 refills | 90.00000 days | Status: AC
Start: 2024-01-02 — End: ?
  Filled 2024-01-02: qty 30, 30d supply, fill #1

## 2024-01-02 MED ORDER — DIPHENHYDRAMINE HCL 50 MG/ML IJ SOLN
50 mg | INTRAVENOUS | 0 refills | Status: DC | PRN
Start: 2024-01-02 — End: 2024-01-02

## 2024-01-02 NOTE — Discharge Instructions - Pharmacy
 Discharge Summary      Name: Daryl Baker  Medical Record Number: [id number]        Account Number:  [id number]  Date Of Birth:  06/07/1956                         Age:  68 y.o.  Admit date:  12/08/2023                     Discharge date: 01/02/2024      Discharge Attending:  Dr. Donalee Citrin  Discharge Summary Completed By: Oletta Lamas, MD    Service: Med-Oncology - 2062    Reason for hospitalization:  Oropharyngeal mass [J39.2]    Primary Discharge Diagnosis:   Oropharyngeal mass    Hospital Diagnoses:  Hospital Problems        Active Problems    * (Principal) Oropharyngeal mass    Oropharyngeal cancer (CMS-HCC)    Severe malnutrition    Mouth pain    Drug-induced constipation    Encephalopathy acute     Present on Admission:   Oropharyngeal mass   Severe malnutrition   Oropharyngeal cancer (CMS-HCC)        Significant Past Medical History        DM (diabetes mellitus) (CMS-HCC)  Hyperlipidemia  Hypertension  Neurogenic amyotrophy  Neuropathy    Allergies   Patient has no known allergies.    Brief Hospital Course   The patient was admitted and the following issues were addressed during this hospitalization: (with pertinent details including admission exam/imaging/labs).      Mr. Daryl Baker is a 68 year old male with PMH of HTN, DM2, left HPV-mediate oropharyngeal cancer who was admitted 12/08/23 for severe oral pain precluding PO intake.     On admission, Mr. Daryl Baker reported having increasing oral pain such that he had been unable to eat or drink for 3-4 days prior. He also had been having progressive fatigue and weakness. Vital signs with tachycardia and modest hypotension noted. CT Neck (3/10) with decrease in size of left oropharyngeal mass (1.7 x 1.5 cm) involving the left tonsil with central fluid attenuation. Mr. Daryl Baker had been following with Dr. Neita Carp for general oncology care and with Dr. Mariane Masters for radiation therapy. Patient had completed 5 cycles of weekly cisplatin and was undergoing IMRT as well. He had previously been considered for G tube placement as outpatient. Admitted to medical oncology.     For his oropharyngeal cancer and inability to tolerate PO, Mr. Daryl Baker had a G-tube placed on 3/12. Patient was transitioned to tube feeds on 3/15 and tolerated this well. He was scheduled to receive cycle 6 of Cisplatin on 3/10 (day of admission) but this was held while inpatient. He did complete radiation while inpatient from 3/17-3/19. Plan for close follow-up with Dr. Neita Carp to discuss further antineoplastic therapy.     Mr. Daryl Baker - [facility] hospital stay was complicated by development of acute encephalopathy (now resolved) and SARS-COV-2 URI.  On 3/16, patient was rapid responded for fever, chills, tachycardia, new oxygen requirement, and confusion. Started on Zosyn and vancomycin. UA was unremarkable, CTA with R > L patchy opacities. His mentation continued to wax and wane. vEEG with pattern of mild encephalopathy, no epileptiform activty. MRI was challenging to obtain due to patient's mentation. 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 Neurology was consulted and suggested that his pattern appeared similar to watershed/borderzone stroke and that hypoperfusion or embolic source. Carotid doppler unremarkable and echocardiogram without explanation for embolus. Neuro recommended starting ASA for secondary prevention and optimizing vascular risk factors. Patient's mentation did improve in subsequent days but did continue to wax and wane. Mr. Daryl Baker was noted to develop s/sx concerning for new infection on 3/29 with fever, tachycardia, leukocytosis, URI symptoms, and family members with similar statuses. He tested positive for COVID and completed a course of Remdesivir (3/30-4/3). No hypoxia and never required supplemental oxygen.     Day of discharge exam notable for:    Male of indicated age in no acute distress   No increased work of breathing, no wheezing; appears comfortable on room air   G-tube in place without surrounding erythema, edema, drainage, bleeding   Abdomen soft, nontender, bowel sounds present    On day of discharge, Mr. Daryl Baker was afebrile with normal range vital signs. He reported his respiratory status was at baseline. Pain was controlled with fentanyl patch and 5-15mg  oxycodone q3h PRN, for which patient was sent home with both. Plan for close follow-up with outpatient oncologist Dr. Neita Carp. Patient verbalized understanding and agreeable to plan of care. All questions answered, concerns addressed, return precautions discussed.    Plan of care directed by Dr. Brand Males, MD  Internal Medicine Resident, PGY1    Items Needing Follow Up   Pending items or areas that need to be addressed at follow up:     Further chemotherapy including subsequent cycles of Cisplatin   Functionality of G tube   Neurology follow-up for post-CVA care and secondary stroke prevention     Pending Labs and Follow Up Radiology    Pending labs and/or radiology review at this time of discharge are listed below: Please note- any labs with collected status will not have a result; if this area is blank, there are no items for review.   Pending Labs       Order Current Status    CULTURE-AFB Preliminary result    CULTURE-FUNGAL,CSF Preliminary result              Medications      Medication List      START taking these medications     aspirin 81 mg chewable tablet; Dose: 81 mg; one tablet by PEG Tube route   daily. Indications: stroke prevention; For: stroke prevention; Quantity:   90 tablet; Refills: 0   fentaNYL 50 mcg/hr patch; Commonly known as: DURAGESIC; Dose: 1 patch;   Apply one patch to top of skin as directed every 72 hours. Indications:   severe chronic pain with opioid tolerance; For: severe chronic pain with   opioid tolerance; Quantity: 10 patch; Refills: 0   gabapentin 300 mg capsule; Commonly known as: NEURONTIN; Dose: 300 mg;   Take one capsule by Per G Tube route three times daily. Indications:   neuropathic pain; For: neuropathic pain; Quantity: 270 capsule; Refills: 0   melatonin 10 mg tablet; Dose: 10 mg; one tablet by PEG Tube route at   bedtime daily. Indications: difficulty sleeping; For: difficulty sleeping;   Quantity: 90 tablet; Refills: 0   metFORMIN 500 mg tablet; Commonly known as: GLUCOPHAGE; Dose: 500 mg;   Take one tablet by Per G Tube route twice daily with meals. Indications:   type 2 diabetes mellitus; For: type 2 diabetes mellitus; Quantity: 180   tablet; Refills: 0; Replaces:  metFORMIN-XR 750 mg extended release tablet   polyethylene glycol 3350 17 g packet; Commonly known as: MIRALAX; Dose:   17 g; one packet by Per G Tube route daily. Indications: emptying of the   bowel; For: emptying of the bowel; Quantity: 30 packet; Refills: 0   rosuvastatin 20 mg tablet; Commonly known as: CRESTOR; Dose: 20 mg; Take   one tablet by PEG Tube route at bedtime daily. Indications: hardening of   the arteries due to plaque buildup; For: hardening of the arteries due to   plaque buildup; Quantity: 90 tablet; Refills: 0   sodium chloride 0.65 % nasal spray; Commonly known as: SEA MIST; Dose: 2   spray; Apply two sprays to each nostril as directed every 6 hours as   needed. Indications: dryness of the nose, stuffy nose; For: dryness of the   nose, stuffy nose; Quantity: 44 mL; Refills: 11   STIMULANT LAXATIVE PLUS 8.6-50 mg tablet; Generic drug:   sennosides-docusate sodium; Dose: 2 tablet; two tablets by Per G Tube   route daily. Indications: constipation; For: constipation; Quantity: 90   tablet; Refills: 0   thiamine mononitrate (vit B1) 100 mg tablet; Dose: 100 mg; one tablet by   PEG Tube route daily. Indications: Prevention of thiamine deficiency; For:   Prevention of thiamine deficiency; Quantity: 90 tablet; Refills: 0   traZODone 150 mg tablet; Commonly known as: DESYREL; Dose: 75 mg; Take   one-half tablet by PEG Tube route at bedtime daily. Indications: Insomnia;   For: Insomnia; Quantity: 90 tablet; Refills: 0     CHANGE how you take these medications     oxyCODONE 5 mg tablet; Commonly known as: ROXICODONE; Dose: 5-15 mg;   Take  one tablet to three tablets by PEG Tube route every 4-6 hours as   needed for Pain. Indications: pain; For: pain; Quantity: 120 tablet;   Refills: 0; What changed: how much to take, how to take this     CONTINUE taking these medications     dexAMETHasone 4 mg tablet; Commonly known as: DECADRON; Beginning 24   hours after each Cisplatin infusion, take 2 tablets by mouth daily for 3   days.; Quantity: 36 tablet; Refills: 0   FREESTYLE LIBRE 2 READER reader; Generic drug: flash glucose scanning   reader; Dose: 1 each; Refills: 0   lidocaine hcl viscous 2 % solution; Commonly known as: LIDOCAINE   VISCOUS; Dose: 5 mL; Swish and Spit 5 mL by mouth as directed four times   daily as needed.; Quantity: 100 mL; Refills: 3   OLANZapine 5 mg tablet; Commonly known as: ZyPREXA; Take 1 tablet by   mouth daily at bedtime for 4 days starting on Cisplatin infusion day.    Indications: prevent nausea and vomiting from cancer chemotherapy; For:   prevent nausea and vomiting from cancer chemotherapy; Quantity: 24 tablet;   Refills: 0   ondansetron HCL 8 mg tablet; Commonly known as: ZOFRAN; Dose: 8 mg; Take   one tablet by mouth every 8 hours as needed (nausea and vomiting).   Indications: prevent nausea and vomiting from cancer chemotherapy; For:   prevent nausea and vomiting from cancer chemotherapy; Quantity: 90 tablet;   Refills: 0   triamcinolone acetonide 0.1 % topical cream; Commonly known as: TRIDERM;   Dose: 1 g; Apply one g topically to affected area twice daily.; Quantity:   80 g; Refills: 3   TRULICITY 3 mg/0.5 mL injection pen; Generic drug: dulaglutide; Dose: 3   mg; Refills:  0     STOP taking these medications     losartan 50 mg tablet; Commonly known as: COZAAR   metFORMIN-XR 750 mg extended release tablet; Commonly known as:   GLUCOPHAGE XR; Replaced by: metFORMIN 500 mg tablet   metoprolol succinate XL 100 mg extended release tablet; Commonly known   as: TOPROL XL   morphine SR 15 mg tablet; Commonly known as: MS CONTIN   morphine SR 30 mg ER tablet; Commonly known as: MS CONTIN       Return Appointments and Scheduled Appointments     Scheduled appointments:      Jan 06, 2024 10:30 AM  Telephone Visit with Hart Robinsons, RD  Radiation Oncology: Gerlene Burdock and Margarita Grizzle Rad Oncology [facility] Radiation Oncology) [address]  [place] [address]  [phone]     Jan 13, 2024 8:00 AM  Office visit with Malon Kindle, MD, SLA [facility] DEVICE  Eye Care: [address] (Ophthalmology) [address]  [place] [address]  [phone]     Feb 10, 2024 11:40 AM  Office visit with Rolan Lipa, MD  Neurology: [facility] (Neurology) [address]  [place] [address]  [phone]          Contact information for after-discharge care                [facility]       [facility]    Phone: [phone]    Fax: [phone]    Where: [address] [place] [address]    Service: Home Health Services              [facility]       [facility]    Phone: [phone]    Fax: [phone]    Where: [address], [place] [address]    Service: Home Infusion and Injection                  Consults, Procedures, Diagnostics, Micro, Pathology   Consults: Neurology, Oncology, and Radiation Oncology  Surgical Procedures & Dates:  G tube placement 3/12  Significant Diagnostic Studies, Micro and Procedures: noted in brief hospital course  Significant Pathology: noted in brief hospital course  Nutrition:  Malnutrition present on admission  ICD-10 code E43: Chronic illness/Severe malnutrition        Energy intake: 75% or less of estimated energy requirement for 1 month or more, Weight loss: Greater than 20% x 1 year      Loss of Subcutaneous Fat: Yes Moderate Triceps  Muscle Wasting: Yes Mild Temple, Deltoid               Malnutrition Interventions: Continue to monitor TF adequacy, tolerance                  Discharge Disposition, Condition   Patient Disposition: Home Health Care Svc [06]  Condition at Discharge: Stable    Code Status   Full Code    Patient Instructions     Activity       Activity as Tolerated   As directed      It is important to keep increasing your activity level after you leave the hospital.  Moving around can help prevent blood clots, lung infection (pneumonia) and other problems.  Gradually increasing the number of times you are up moving around will help you return to your normal activity level more quickly.  Continue to increase the number of times you are up to the chair and walking daily to return to your normal activity level. Begin to work toward your normal activity level at discharge          Diet       Clear Liquid Diet   As directed      A clear liquid diet includes clear liquids and some foods. Proper foods should be clear and turn to liquid when they are at room temperature. Foods that are okay to eat include popsicles or gelatin. Liquids that are okay to drink include clear juices (apple, grape and cranberry), tea, coffee, and clear broths.     If you have questions about your diet after you go home, you can call a dietitian at [phone].    Tube Feeding   As directed      Formula: TwoCal HN   Schedule: 1 carton ( ) per feed four times daily and 1.5 cartons ( ) per feed once daily  water bolus five times daily             Discharge education provided to patient., Signs and Symptoms:   Report these signs and symptoms       Report These Signs and Symptoms   As directed      Please contact your doctor if you have any of the following symptoms: temperature higher than 100.4 degrees F, uncontrolled pain, persistent nausea and/or vomiting, difficulty breathing, chest pain, severe abdominal pain, headache, unable to urinate, unable to have bowel movement, or drainage with a foul odor        , Education: , and Others Instructions:   Other Orders       Questions About Your Stay   Complete by: As directed      Discharging attending physician: Sharen Hones    Order comments: For questions or concerns regarding your hospital stay, call [phone].            Additional Orders: Case Management, Supplies, Home Health     Home Health/DME                HOME HEALTH/DME  ONCE        Comments: Home Health/Durable Medical Equipment Order Details    Patient Name:  Oronde Hallenbeck                   Medical Record Number:   [id number]    Patient Diagnosis & ICD 10 Codes: oropharyngeal cancer C10.9  Patient Height: 180.3 cm (5' 11)   Patient Weight: 105.2 kg (232 lb)    Provider Information:  MD Name: Dr. Durenda Hurt  MD Phone Number: [phone]  MD Fax Number: [phone]  MD NPI: [id number]    Equipment Information & Instructions:    Enteral Nutrition   Diagnosis: severe malnutrition  ICD10: E43  Length of Need (0-99): greater than 90 days    Formula Name: TwoCal HN (or equivalent) 5.5 cartons per day  Patient recieves enteral nutrition feedings by: Bolus  Patient receives enteral nutrition per: G-tube  Patient requires water bolus: Yes        Amount: 30 cc       Frequency: before and after each bolus      Agency Instructions:     Start of care within 24-48 hours of hospital discharge, anticipated discharge 01/01/2024.     RN to complete general assessment, including vitals with temperature, monitor and teach patient and/or caregiver  medication management and compliance, monitor and teach pain management and pain medication compliance when necessary.     Monitor and teach signs and symptoms of infection, monitor and teach disease management, including signs and symptoms, diet, who and when to call, hospital readmission avoidance.   Please see attached AVS for additional discharge instruction.    Physical Therapy  to evaluate and treat along with home safety evaluation.  Teach strategies for energy conservation, mobility training, strengthening, balance activities and address deficits, maximize function and improve safety.  Occupational Therapy   to evaluate and treat along with home safety evaluation.  Teach strategies for energy conservation, mobility training, strengthening, balance activities and address deficits, maximize function and improve safety.  ,    I certify that this patient is under my care and that I, or a nurse practitioner or physician's assistant working with me, had a face-to-face encounter that meets the physician's face-to-face encounter requirements with this patient on 12/31/23.     This patient is under my care, and I have initiated the establishment of the plan of care. This patient will be followed by a physician after discharge, who will periodically review the plan of care.   Clinical findings to support homebound status: Assistance required to ambulate/transfer  Clinical findings to support home care services: Unstable clinical condition, Deficits in medical management, Dysphagia, Deficit in ADL's, and Safety deficits   Question Answer Comment   Attending Name/Contact Dr. Durenda Hurt    PCP Name/Contact Dr. Maryln Manuel ph. [phone]  fax: [phone]    Home Health to Follow PCP                             Signed:  Oletta Lamas, MD  01/02/2024      cc:  Primary Care Physician:  Felecia Jan     Referring physicians:  No ref. provider found   Additional provider(s):        Did we miss something? If additional records are needed, please fax a request on office letterhead to [phone]. Please include the patient's name, date of birth, fax number and type of information needed. Additional request can be made by email at [email or web address]. For general questions of information about electronic records sharing, call [phone].

## 2024-01-02 NOTE — Progress Notes
 SPEECH-LANGUAGE PATHOLOGY  NO TREATMENT NOTE     Attempted to complete ongoing dysphagia intervention this day. Pt politely declines PO trials 2/2 to pain/anxiety. Follow-up completed with RN to report patient's request for medication. This department will continue to follow as patient is able/available to participate.    Pt with possible discharge this day. Recommend continued follow-up with outpatient SLP to manage dysphagia.    Therapist: Gypsy Decant, M.S. CCC-SLP  Date: 01/02/2024

## 2024-01-02 NOTE — Case Management (ED)
 CMA Note:       Delivered a cab pass for pt to return home to bedside RN per request from Gray Bernhardt, [facility].    Calla Kicks  Case Management Assistant  For additional assistance please contact SWCM

## 2024-01-02 NOTE — Case Management (ED)
 Case Management Progress Note    NAME:Daryl Baker                          MRN: [id number]              DOB:10-Jun-1956          AGE: 68 y.o.  ADMISSION DATE: 12/08/2023             DAYS ADMITTED: LOS: 25 days      Today's Date: 01/02/2024    PLAN: Pt to d/c home with [facility] home health services.     Expected Discharge Date: 01/01/2024   Is Patient Medically Stable: Yes   Are there Barriers to Discharge? No    INTERVENTION/DISPOSITION:  Discharge Planning              Discharge Planning: Home Health    SW participated in huddle; EMR reviewed.    Pt is stable for d/c at this time. Current recommendations are for home with intermittent supervision and HH services.    SW was notified by bedside RN that pt wife is not going to be able to come get him today.    SW tasked CMA to deliver cab pass to address in [place], [place].     SW will continue to follow for d/c planning and placement needs.     Transportation              Does the Patient Need Case Management to Arrange Discharge Transport? (ex: facility, ambulance, wheelchair/stretcher, Medicaid, cab, other): No  Transportation Name, Phone and Availability #1: ahsan, esterline (Spouse)  [phone] (Mobile)    Support              Support: Pt/Family Updates re:POC or DC Plan, Huddle/team update    Info or Referral              Information or Referral to Community Resources: No Needs Identified    Positive SDOH Domains and Potential Barriers   Positive for SDOH Domain:  (No Needs Identified)     Medication Needs              Medication Needs: No Needs Identified    Financial              Financial: No Needs Identified    Legal              Legal: No Needs Identified    Other              Other/None: No needs identified    Discharge Disposition                               Selected Continued Care - Admitted Since 12/08/2023       [facility] Coordination complete.      Service Provider Services Address Phone Fax Patient Preferred    [facility] [address], [place] [address] [phone] --              [facility] Coordination complete.      Service Provider Services Address Phone Fax Patient Preferred    [facility] [address], [place] [address] [phone] --                    Gray Bernhardt,  LSCSW, LCSW  Inpatient Social Work Case Manager  Med Onc, MPP  Available on Voalte   Work Cell: [phone]

## 2024-01-02 NOTE — Progress Notes
 Inpatient Medical Oncology Progress Note    Name:  Daryl Baker   Today's Date:  01/02/2024  Admission Date: 12/08/2023  LOS: 25 days                       ASSESSMENT & PLAN     Principal Problem:    Oropharyngeal mass  Active Problems:    Oropharyngeal cancer (CMS-HCC)    Severe malnutrition    Mouth pain    Drug-induced constipation    Encephalopathy acute    68 y.o. 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 He has had increasing pain over the last few days prior to admission near his oropharyngeal mass on the left along with the left aspect of his tongue preventing any PO intake and has a scheduled G-tube placement.        SARS-COV-2 URI  On 3/29, developing s/sx concerning for new infection  Fevered AM to 100.8  Tachycardic to 113 at 3 AM  Uptrending leukocytosis, WBC = 13  Nasal congestion & cough  Possible sick contacts (family visitors with similar illnesses around this time)   UA unremarkable  Lactate 1.8  CXR (3/29): unremarkable  RVP: COVID +   S/p Remdesivir (3/30-4/3)   PLAN  SeaMist for congestion (had to DC afrin for >3 days therapy)     Multiple Brain Infarcts  Acute Metabolic Encephalopathy (waxing and waning)   Differential Dx: emboli vs watershed infarcts  Low suspicious/excluded: hypoglycemia (BG's normal or elevated), hyponatremia/hypocalcemia/electrolyte abn, no evidence of hypercarbia with normal VBG, thyroid dysfunction (TSH 0.39)   Hx of cisplatin 5 cycles, which can cause encephalopathy/PRES  vEEG: mild encephalopathy  MRI attempted x2 with ativan but patient was unable to tolerate without anesthesia  MRI brain 3/24 : Scattered small acute to subacute bilateral cerebral white matter Infarcts concerning for emboli.  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  PLAN  Trulicity nonformulary, will hold inpatient  Continue metformin 500 BID for hyperglycemia via G-tube     Dysphagia   Oropharyngeal pain  Due to left oropharyngeal cancer  G-tube placed 3/12 AM given significant pain and inability to tolerate PO   Will need the G-tube feeds for >90 days.  PLAN  Magic mouthwash TID PRN for oropharyngeal pain  Consulted Dietician  Transitioned to tube feeds 3/15  Palliative Care consult for pain management as above.   Fentanyl patch 83mcg/hr  Oxycodone 15mg  q4h PRN  Gabapentin 300mg  TID  Bowel regimen with senokot-s 2ts BID, miralax BID  Fluconazole 400mg  qday for oral candidiasis until 4/4                 Left HPV-Mediated Oropharyngeal SCC  Currently on chemo and radiation  Follow in Oncology clinic with dr Neita Carp and radiation Oncology Dr Mariane Masters  CT Neck revealed:  1.  Marked decrease size of the prior large left oropharyngeal mass. There   is a residual 1.7 x 1.5 cm lesion involving the left tonsil with central   fluid attenuation. Treated necrotic tumor is the leading consideration. A   post therapeutic retention cyst could appear similar. An abscess is   thought less likely though direct visualization and clinical correlation   is recommended regarding symptoms of infection.   2.  Improvement of bilateral cervical lymphadenopathy.   Radiation Oncology finished radiation while inpatient.(3/17-3/19)  PLAN  Triamcinolone cream for radiation induced dermatitis  Determine further antineoplastic therapy as OP with Dr. Neita Carp      Normocytic anemia  Thrombocytopenia- resolved  Neutropenia- resolved  ANC <1  Likely due to Chemo & radiation  Follow with daily CBC w/ Diff  Needs anemia worked up outpatient    Neutropenic fever- resolved  Presumed Aspiration pneumonia- resolved  Sepsis- resolved  Tachycardia, fever, increase in WBC (leukopenic at baseline)  Source likely pulmonary given imaging findings  UA unremarkable  CTA Chest (3/17): Development of a few clustered nodular and patchy opacities within the   right greater than left lower lobes, probably infectious or aspiration-related.   MRSA nares neg  Completed Zosyn (3/17-3/23)     Hypertension > Continue to hold metoprolol & losartan    BUNDLE   FEN:  Bolus tube feeds with free water flushes/Replace electrolytes as needed/Tube feeds  DIET CLEAR LIQUID w/ Body mass index is 32.36 kg/m?Marland Kitchen   DVT PPX:  40mg  qday enoxaparin, SCDs bilat  Code status: Full Code     Patient seen and case discussed with Dr. Brand Males, MD  Internal Medicine Resident, PGY1  ________________________________________________________________________      SUBJECTIVE     No acute overnight events. Patient seen and examined at bedside this morning. Mr. Karbowski reports his respiratory status is back to baseline. He is still having some sinus congestion but has been doing well with nasal sprays. Feeling ready for discharge, states just get me out of here. Denies fevers/chills, nausea/vomiting, abdominal or chest pain, difficulty breathing. Tolerating tube feeds, has been self-administering both TF's and meds.      Medications     Scheduled Meds:aspirin chewable tablet 81 mg, 81 mg, PEG Tube, QDAY  Diet Enteral Feeding Bolus, , SEE ADMIN INSTRUCTIONS, 5XDAY (6,10,14,18,22   And  Water Bolus, 100 mL, PEG Tube, 5XDAY (6,10,14,18,22  enoxaparin (LOVENOX) syringe 40 mg, 40 mg, Subcutaneous, QDAY(21)  fentaNYL (DURAGESIC) 50 mcg/hr patch 1 patch, 1 patch, Transdermal, Q72H*   And  Verification of Patch Placement and Integrity - fentaNYL 50 mcg/hr, , Transdermal, BID  fluconazole (DIFLUCAN) oral suspension 200 mg, 200 mg, Oral, QDAY  gabapentin (NEURONTIN) capsule 300 mg, 300 mg, Per G Tube, TID  melatonin tablet 10 mg, 10 mg, Oral, QHS  metFORMIN (GLUCOPHAGE) tablet 500 mg, 500 mg, Oral, BID w/meals  polyethylene glycol 3350 (MIRALAX) packet 17 g, 1 packet, Per G Tube, QDAY  rosuvastatin (CRESTOR) tablet 20 mg, 20 mg, Oral, QHS  sennosides-docusate sodium (SENOKOT-S) tablet 2 tablet, 2 tablet, Per G Tube, QDAY  thiamine mononitrate (vit  B1) tablet 100 mg, 100 mg, PEG Tube, QDAY  traZODone (DESYREL) tablet 75 mg, 75 mg, Oral, QHS  triamcinolone acetonide (KENALOG) 0.1 % topical cream 1 g, 1 g, Topical, QDAY    Continuous Infusions:  PRN and Respiratory Meds:acetaminophen Q6H PRN, bisacodyL QDAY PRN, dextrose 50% (D50) IV PRN, diphenhydrAMINE/lidocaine/mag,al-simeth(#) TID PRN, guaiFENesin  (ROBITUSSIN) oral solution Q4H PRN, ondansetron Q6H PRN **OR** ondansetron (ZOFRAN) IV Q6H PRN, oxyCODONE Q3H PRN, pancrelipase 20,880 Units/sodium bicarbonate 650 mg ([facility] CLOG DESTROYER) PRN (On Call from Rx), phenoL PRN, sodium chloride Q6H PRN      Medications Prior to Admission   Medication Sig Dispense Refill Last Dose/Taking   ? dexAMETHasone (DECADRON) 4 mg tablet Beginning 24 hours after each Cisplatin infusion, take 2 tablets by mouth daily for 3 days. 36 tablet 0 12/01/2023   ? FREESTYLE LIBRE 2 READER reader Use one each as directed every 6 hours.   Taking   ? lidocaine hcl viscous (LIDOCAINE VISCOUS) 2 % solution Swish and Spit 5 mL by mouth as directed four times daily as needed. 100 mL 3 Past Week   ? losartan (COZAAR) 50 mg tablet Take two tablets by mouth daily.   12/08/2023 Morning   ? metFORMIN-XR (GLUCOPHAGE XR) 750 mg extended release tablet Take one tablet by mouth daily with dinner.   12/07/2023 Bedtime   ? metoprolol succinate XL (TOPROL XL) 100 mg extended release tablet Take one tablet by mouth daily.   12/08/2023 Morning   ? morphine SR (MS CONTIN) 15 mg tablet Take one tablet by mouth every 12 hours.   Taking   ? [EXPIRED] morphine SR (MS CONTIN) 30 mg ER tablet Take one tablet by mouth every 12 hours for 30 days. Indications: severe chronic pain requiring long-term opioid treatment  Dose adjustment requiring early fill 60 tablet 0 12/08/2023 Morning   ? OLANZapine (ZYPREXA) 5 mg tablet Take 1 tablet by mouth daily at bedtime for 4 days starting on Cisplatin infusion day.  Indications: prevent nausea and vomiting from cancer chemotherapy 24 tablet 0 Taking   ? ondansetron HCL (ZOFRAN) 8 mg tablet Take one tablet by mouth every 8 hours as needed (nausea and vomiting). Indications: prevent nausea and vomiting from cancer chemotherapy 90 tablet 0 Taking As Needed   ? oxyCODONE (ROXICODONE) 5 mg tablet Take one tablet to two tablets by mouth every 4-6 hours as needed for Pain. Indications: pain 120 tablet 0 12/08/2023 Morning   ? triamcinolone acetonide (TRIDERM) 0.1 % topical cream Apply one g topically to affected area twice daily. 80 g 3 Past Week   ? TRULICITY 3 mg/0.5 mL injection pen Inject 0.5 mL under the skin every 7 days. Sunday   12/07/2023 Morning       Allergies     No Known Allergies    OBJECTIVE                            Vital Signs: Last Filed                 Vital Signs: 24 Hour Range   BP: 144/72 (04/04 0254)  Temp: 36.5 ?C (97.7 ?F) (04/04 0254)  Pulse: 104 (04/04 0254)  Respirations: 18 PER MINUTE (04/04 0254)  SpO2: 96 % (04/04 0254)  O2 Device: None (Room air) (04/04 0254) BP: (110-144)/(60-74)   Temp:  [36.5 ?C (97.7 ?F)-36.8 ?C (98.3 ?F)]   Pulse:  [93-118] Respirations:  [18 PER MINUTE]  SpO2:  [92 %-98 %]   O2 Device: None (Room air)   Intensity Pain Scale (Self Report): 8 (01/01/24 1022) Vitals:    12/09/23 1715 12/23/23 0752 12/25/23 1242   Weight: 101.5 kg (223 lb 12.8 oz) 101.5 kg (223 lb 12.3 oz) 105.2 kg (232 lb)           Intake/Output Summary: (Last 24 Hours)       Intake/Output Summary (Last 24 hours) at 01/02/2024 0650  Last data filed at 01/01/2024 2020  Gross per 24 hour   Intake 427 ml   Output 100 ml   Net 327 ml           Physical Exam     Physical Exam  Vitals and nursing note reviewed.   Constitutional:       General: He is not in acute distress.     Appearance: Normal appearance. He is not ill-appearing.   HENT:      Head: Normocephalic and atraumatic.      Nose: Nose normal.   Eyes:      Conjunctiva/sclera: Conjunctivae normal.   Cardiovascular:      Rate and Rhythm: Normal rate and regular rhythm.      Heart sounds: Normal heart sounds. No murmur heard.  Pulmonary:      Effort: Pulmonary effort is normal. No respiratory distress.      Breath sounds: Normal breath sounds. No wheezing.   Abdominal:      Comments: G-tube in place without surrounding erythema, edema, bleeding, drainage   Skin:     General: Skin is warm and dry.   Neurological:      Mental Status: He is alert. Mental status is at baseline.   Psychiatric:         Mood and Affect: Mood normal.         Thought Content: Thought content normal.       Lab Review     24-hour labs:    Results for orders placed or performed during the hospital encounter of 12/08/23 (from the past 24 hours)   POC GLUCOSE    Collection Time: 01/01/24  8:54 AM   Result Value Ref Range    Glucose, POC 206 (H) 70 - 100 mg/dL   POC GLUCOSE    Collection Time: 01/01/24 12:26 PM   Result Value Ref Range    Glucose, POC 123 (H) 70 - 100 mg/dL   POC GLUCOSE    Collection Time: 01/01/24  6:20 PM   Result Value Ref Range    Glucose, POC 242 (H) 70 - 100 mg/dL   POC GLUCOSE    Collection Time: 01/01/24  9:44 PM   Result Value Ref Range    Glucose, POC 121 (H) 70 - 100 mg/dL   MAGNESIUM    Collection Time: 01/02/24  3:03 AM   Result Value Ref Range    Magnesium 2.0 1.6 - 2.6 mg/dL   COMPREHENSIVE METABOLIC PANEL    Collection Time: 01/02/24  3:03 AM   Result Value Ref Range    Sodium 135 (L) 137 - 147 mmol/L    Potassium 4.5 3.5 - 5.1 mmol/L    Chloride 98 98 - 110 mmol/L    Glucose 178 (H) 70 - 100 mg/dL    Blood Urea Nitrogen 15 7 - 25 mg/dL    Creatinine 1.91 4.78 - 1.24 mg/dL    Calcium 8.3 (L) 8.5 - 10.6 mg/dL    Total Protein 6.0 6.0 - 8.0  g/dL    Total Bilirubin 0.3 0.2 - 1.3 mg/dL    Albumin 3.3 (L) 3.5 - 5.0 g/dL    Alk Phosphatase 76 25 - 110 U/L    AST 14 7 - 40 U/L    ALT 10 7 - 56 U/L    CO2 27 21 - 30 mmol/L    Anion Gap 10 3 - 12    Glomerular Filtration Rate (GFR) >60 >60 mL/min   CBC AND DIFF    Collection Time: 01/02/24  3:03 AM   Result Value Ref Range    White Blood Cells 14.10 (H) 4.50 - 11.00 10*3/uL    Red Blood Cells 2.88 (L) 4.40 - 5.50 10*6/uL    Hemoglobin 9.3 (L) 13.5 - 16.5 g/dL    Hematocrit 70.6 (L) 40.0 - 50.0 %    MCV 94.2 80.0 - 100.0 fL    MCH 32.5 26.0 - 34.0 pg    MCHC 34.4 32.0 - 36.0 g/dL    RDW 23.7 (H) 62.8 - 15.0 %    Platelet Count 391 150 - 400 10*3/uL    MPV 9.4 7.0 - 11.0 fL   MANUAL DIFF    Collection Time: 01/02/24  3:03 AM   Result Value Ref Range    ANISO Present     POLY Present     Platelet Estimate Normal     Segmented Neutrophils 73 41 - 77 %    Lymphocytes 3 (L) 24 - 44 %    Monocytes 19 (H) 4 - 12 %    Metamyelocyte 3 %    Bands 2 0 - 10 %    Absolute Neutrophil Count Manual 10.6 (H) 1.8 - 7.0 10*3/uL       Point of Care Testing  (Last 24 hours)  Glucose: (!) 178 (01/02/24 0303)  POC Glucose (Download): (!) 121 (01/01/24 2144)      Radiology and Other Diagnostic Review      Pertinent radiology reviewed.

## 2024-01-02 NOTE — Telephone Encounter
 Received voicemail from [facility] asking for a call back regarding final cycle of treatment. Pt is currently hospitalized. Returned call. No answer. LMTC.

## 2024-01-03 NOTE — Case Management (ED)
 Case Management Progress Note    NAME:Daryl Baker                          MRN: [id number]              DOB:1956-08-10          AGE: 68 y.o.  ADMISSION DATE: 12/08/2023             DAYS ADMITTED: LOS: 25 days      Today's Date: 01/03/2024    PLAN: WE on call RNCM received message from Gen Med physician that patient wife called stating they have no Gtube supplies, only feeds.  Patient d/c 01/02/24 after getting a gtube and having that replaced on 4/3 after a piece broke off.  RNCM spoke to patient wife who verified they have no supplies, only feeds.  She said she took him to the local ED and they had no supplies to fit the Gtube.    -RNCM called IR and the only person left in the dept. For the day didn't know about what supplies were needed.  RNCM paged on call NP for IR [phone] and got no reply.  Per operator, there is only a fellow on call for IR.  RNCM spoke to [facility] supervisor on call who suggested talking to [facility].    -RNCM called [facility] and explained situation.  Because we aren't sure what specific supplies and size are needed and because patient is 4 hours away, our only option is to instruct patient wife to bring him back to the hospital.    -RNCM called patient wife.  She said she currently is back with patient to their local ED.  I advised she can bring him back to [place] to address this issue.      INTERVENTION/DISPOSITION:  Discharge Planning              Discharge Planning: Home Health  Transportation              Does the Patient Need Case Management to Arrange Discharge Transport? (ex: facility, ambulance, wheelchair/stretcher, Medicaid, cab, other): No  Transportation Name, Phone and Availability #1: honorio, devol (Spouse)  [phone] (Mobile)  Support              Support: Pt/Family Updates re:POC or DC Plan, Huddle/team update  Info or Referral              Information or Referral to Community Resources: No Needs Identified  Positive SDOH Domains and Potential Barriers   Positive for SDOH Domain:  (No Needs Identified)               Medication Needs              Medication Needs: No Needs Identified  Financial              Financial: No Needs Identified  Legal              Legal: No Needs Identified  Other              Other/None: No needs identified  Discharge Disposition                                                                                                                                                      Selected Continued Care - Discharged on 01/02/2024 Admission date: 12/08/2023 - Discharge disposition: Home Health Care Svc      [facility] Coordination complete.      Service Provider Services Address Phone Fax Patient Preferred    [facility] [address], [place] [address] [phone] --              [facility] Coordination complete.      Service Provider Services Address Phone Fax Patient Preferred    [facility] [address] [place], [place] [address] [phone] --                      Antonietta Breach, RN    Tristan Schroeder, CCM  Integrated Case Manager  *[phone]

## 2024-01-05 ENCOUNTER — Encounter: Admit: 2024-01-05 | Discharge: 2024-01-05

## 2024-01-06 ENCOUNTER — Ambulatory Visit: Admit: 2024-01-06 | Discharge: 2024-01-06

## 2024-01-06 ENCOUNTER — Encounter: Admit: 2024-01-06 | Discharge: 2024-01-06

## 2024-01-06 DIAGNOSIS — C109 Malignant neoplasm of oropharynx, unspecified: Secondary | ICD-10-CM

## 2024-01-06 DIAGNOSIS — C028 Malignant neoplasm of overlapping sites of tongue: Secondary | ICD-10-CM

## 2024-01-06 DIAGNOSIS — K148 Other diseases of tongue: Secondary | ICD-10-CM

## 2024-01-06 DIAGNOSIS — R5382 Chronic fatigue, unspecified: Secondary | ICD-10-CM

## 2024-01-06 NOTE — Progress Notes
 Clinical Nutrition Assessment Summary    Daryl Baker is a 68 y.o. 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, AJCC 8th Edition  - Clinical stage from 10/14/2023: Stage II (cT3, cN2, cM0, p16+) -     Past Medical History: Reviewed    Current Treatment Plan: OP HEAD/NECK CISPLATIN (WEEKLY - 40MG /M2) + XRT     Nutrition Assessment of Patient:       Wt Readings from Last 5 Encounters:   12/25/23 105.2 kg (232 lb)   12/16/23 101.5 kg (223 lb 12.8 oz)   12/08/23 102 kg (224 lb 13.9 oz)   12/05/23 104.8 kg (231 lb)   12/05/23 105.7 kg (233 lb)        There is no height or weight on file to calculate BMI.    RD spoke with pts wife on the phone today.  Pt was admitted to the hospital and had G tube placed. Per chart review pt receiving EN and supplies via [employer]. Wife reports is not sure but has not received supplies yet (discharged on 4/4) and is in need of gravity bags and attachment for tube.  Wife reports has been able to make the G tube work and EN is going ok but feels it would be much better with new attachment. RD reached out to [employer] and confirmed DME, pt to receive EN and supplies today.  RD will follow.     TwoCal HN, total of 5.5 cartons daily.   1 carton (237 mL) per feed QID + 1.5 carton (~356 mL) per feed 1x/day.  This provides 2613 kcal/day, 109g protein/day, and free water/day.   At least 30mL free water flush before and after each bolus feed. Additional fluids per primary team recs.  Avoid using plunger of syringe when administering bolus feeds- administer via gravity.        DME: [employer]      Nutrition Monitoring and Evaluation:  Goal:< 10% wt loss starting weight (245 lbs)  Time Frame:Throughout Treatment and Recovery  -7lbs ( 3%)     The patient was allowed to ask questions and actively participated in creating plan of care. I provided the patient with my contact information and instructed pt on how to get in touch with me if questions or concerns arise. Thank you for allowing nutrition services to participate in this patients care.     Follow up Date: in clinic    [place], [place], RD, LD  [phone]

## 2024-01-06 NOTE — Telephone Encounter
 Received voicemail from Daryl Baker wife, Daryl Baker wondering if he will be receiving his last dose of chemotherapy. Spoke with Dr. Neita Carp and he does not want to give another cycle due to the side effects pt experienced. We will follow up 12 weeks from last radiation treatment (around 03/19/24) with a PET scan. They would like to get the PET locally at [facility]. Will fax orders and work on getting pt scheduled on 6/20 for PET and 6/27 for TH with Dr. Neita Carp to discuss results. Will send my chart message with schedule once appointments have been made. Wife v/u. No further questions at this time.     [facility]  [address] [place], [place] [address] ? [address]  [phone]

## 2024-01-08 ENCOUNTER — Encounter: Admit: 2024-01-08 | Discharge: 2024-01-08

## 2024-01-10 ENCOUNTER — Encounter: Admit: 2024-01-10 | Discharge: 2024-01-10 | Payer: MEDICARE

## 2024-01-13 ENCOUNTER — Encounter: Admit: 2024-01-13 | Discharge: 2024-01-13 | Payer: MEDICARE

## 2024-01-13 ENCOUNTER — Ambulatory Visit: Admit: 2024-01-13 | Discharge: 2024-01-13 | Payer: MEDICARE

## 2024-01-13 NOTE — Progress Notes
 RD attempted to reach pt by phone today.  LVM with call back number.  RD will follow.     Hart Robinsons, MS, RD, LD  [phone]

## 2024-01-19 ENCOUNTER — Encounter: Admit: 2024-01-19 | Discharge: 2024-01-19 | Payer: MEDICARE

## 2024-01-20 ENCOUNTER — Encounter: Admit: 2024-01-20 | Discharge: 2024-01-20 | Payer: MEDICARE

## 2024-01-20 ENCOUNTER — Ambulatory Visit: Admit: 2024-01-20 | Discharge: 2024-01-20 | Payer: MEDICARE

## 2024-01-20 NOTE — Progress Notes
 Clinical Nutrition Assessment Summary    Daryl Baker is a 68 y.o. 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, AJCC 8th Edition  - Clinical stage from 10/14/2023: Stage II (cT3, cN2, cM0, p16+) -     Past Medical History: Reviewed    Current Treatment Plan: OP HEAD/NECK CISPLATIN  (WEEKLY - 40MG /M2) + XRT     Nutrition Assessment of Patient:       Wt Readings from Last 5 Encounters:   12/25/23 105.2 kg (232 lb)   12/16/23 101.5 kg (223 lb 12.8 oz)   12/08/23 102 kg (224 lb 13.9 oz)   12/05/23 104.8 kg (231 lb)   12/05/23 105.7 kg (233 lb)        There is no height or weight on file to calculate BMI.    RD spoke with wife on phone, wife reports pt is tolerating EN and they received supplies they needed to fix tubing.  Wife reports down to 22 cartons and RD provided phone numbers to re-order.  RD will follow.     TwoCal HN, total of 5.5 cartons daily.   1 carton (237 mL) per feed QID + 1.5 carton (~356 mL) per feed 1x/day.  This provides 2613 kcal/day, 109g protein/day, and free water /day.   At least 30mL free water  flush before and after each bolus feed. Additional fluids per primary team recs.  Avoid using plunger of syringe when administering bolus feeds- administer via gravity.        DME: [employer]      Nutrition Monitoring and Evaluation:  Goal:< 10% wt loss starting weight (245 lbs)  Time Frame:Throughout Treatment and Recovery  -7lbs ( 3%)     The patient was allowed to ask questions and actively participated in creating plan of care. I provided the patient with my contact information and instructed pt on how to get in touch with me if questions or concerns arise. Thank you for allowing nutrition services to participate in this patients care.     Follow up Date: in clinic    [place], [place], RD, LD  Phone:[phone]

## 2024-01-28 ENCOUNTER — Encounter: Admit: 2024-01-28 | Discharge: 2024-01-28 | Payer: MEDICARE

## 2024-01-30 ENCOUNTER — Encounter: Admit: 2024-01-30 | Discharge: 2024-01-30 | Payer: MEDICARE

## 2024-02-02 ENCOUNTER — Encounter: Admit: 2024-02-02 | Discharge: 2024-02-02 | Payer: MEDICARE

## 2024-02-03 ENCOUNTER — Encounter: Admit: 2024-02-03 | Discharge: 2024-02-03 | Payer: MEDICARE

## 2024-02-03 NOTE — Progress Notes
 Reason for Visit: ~1-2 month follow-up for symptom/healing check after completion of radiation treatment.      Diagnosis: oropharyngeal cancer  End of Treatment: 12/18/23     Imaging: No   Restaging scans estimated ~end of June/early July.     Pain Scale: 4/10 - mouth discomfort  - oxycodone  every 4 1/2 hours   - he ran out of this medication this past Sunday and he really felt the difference  New Symptoms/Concerns: feeling anxious/wound up often, dental concerns (see below)  Fatigue Scale: fair, better than he was, at about 50%  Nutrition:   PEG & PO  New/worsening difficulties with swallowing: No - he can drink water  now w/o issue  Xerostomia pretty bad  He met w/ the speech therapist recently  5 1/2 cartons/day  He was 213 lbs ~ a week ago, so today's weight is an improvement for him    Wt Readings from Last 3 Encounters:   02/10/24 100.2 kg (221 lb)   12/25/23 105.2 kg (232 lb)   12/16/23 101.5 kg (223 lb 12.8 oz)     Dental: Yes   New/worsening concerns: yes -- see note from 01/28/24 telephone encounter regarding teeth #17, 18, & #19  Dental Information:  Dentist/Office: [facility]  Address: [address], [place], [place] [address]  Phone: [phone]   Fax: [phone]     Dentist/Office: [facility]  Address: [address] [place], [place], [place] [address]  Phone: [phone] // [phone]   F: [phone]      Labs:  [facility]: pre-treatment lab collected 10/22/23 [phone]) and post-treatment lab to be collected in July (~3 months after end of treatment)  TSH   Date Value Ref Range Status   12/08/2023 0.38 0.35 - 5.00 ?IU/mL Final   10/03/2023 1.17 0.35 - 5.00 ?IU/mL Final        Education provided on continued self-care (including but not limited to, dental care, nutrition, pain management, and survivorship).

## 2024-02-04 ENCOUNTER — Encounter: Admit: 2024-02-04 | Discharge: 2024-02-04 | Payer: MEDICARE

## 2024-02-06 ENCOUNTER — Ambulatory Visit: Admit: 2024-02-06 | Discharge: 2024-02-06 | Payer: MEDICARE

## 2024-02-06 NOTE — Progress Notes
 RD attempted to reach pt by phone.  LVM with call back number.  RD will follow.    Hart Robinsons, MS, RD, LD  Phone:[phone]

## 2024-02-09 ENCOUNTER — Encounter: Admit: 2024-02-09 | Discharge: 2024-02-09 | Payer: MEDICARE

## 2024-02-09 MED ORDER — OXYCODONE 5 MG PO TAB
5 mg | ORAL_TABLET | GASTROSTOMY | 0 refills | 6.00000 days | Status: AC | PRN
Start: 2024-02-09 — End: ?

## 2024-02-09 NOTE — Telephone Encounter
 Reinier calls asking for refill of oxycodone . He has decreased his dose to 5mg  Q6. Last script sent in 4/22 for 7 day supply, request forwarded to Dr. Severa Daniels.

## 2024-02-09 NOTE — Progress Notes
 Radiation Oncology Follow Up Note   Date: 02/10/2024       Daryl Baker is a 68 y.o. male.     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 He is now s/p concurrent chemoradiation 6996cGy/32fx completed 12/18/2023 with weekly cisplatin  (x5) with Dr. Patrice Books. He presents today for interval healing check.    Interim History:  LOV 3/18 OTV     He ran out of oxycodone  on Saturday night and didn't have any on Sunday. He experience bad withdrawal, reports being unable to lie down still at night and was up every 5 minutes. Very anxious/agitate feeling. No significant pain though. He is now back to taking his oxycodone  5mg  around the clock (sets alarm clock at night) every 4-6 hours. He is using his feeding tube for most nutritional intake. 5.5 cartons daily. Starting to feel a bit full with it. Only having soups by mouth, which are going down okay but taste 'sucks.' He does have speech therapist who comes to his home.     Of major concern/bother to him is congestion and being unable to breathe through his nose. When he has this, he gets anxious due to inability to breathe. He uses sinus rinses every night and has been using flonase for about a week. He also takes mucinex  throughout the day and has recently cut back the dose in half. He does think the thick mucus has gotten better.    He kept having small fragments of extracted tooth break off throughout treatment and he was having a lot of pain in left posterior mandible at site of prior tooth extraction. His dentist recommended further extractions but the patient reports one day he woke up and the pain had subsided-- it feels fine now.     Not on fentanyl  patch. Never started steroids.       Review of Systems  As above     Objective:          ALPRAZolam (XANAX) 0.5 mg tablet one tablet twice daily as needed.    aspirin  81 mg chewable tablet one tablet by PEG Tube route daily. Indications: stroke prevention (Patient not taking: Reported on 02/10/2024)    dexAMETHasone  (DECADRON ) 4 mg tablet Beginning 24 hours after each Cisplatin  infusion, take 2 tablets by mouth daily for 3 days. (Patient not taking: Reported on 02/10/2024)    fentaNYL  (DURAGESIC ) 50 mcg/hr patch Apply one patch to top of skin as directed every 72 hours. Indications: severe chronic pain with opioid tolerance (Patient not taking: Reported on 02/10/2024)    FREESTYLE LIBRE 2 READER reader Use one each as directed every 6 hours.    gabapentin  (NEURONTIN ) 300 mg capsule Take one capsule by Per G Tube route three times daily.  Indications: neuropathic pain (Patient not taking: Reported on 02/10/2024)    hydrOXYzine  HCL (ATARAX ) 25 mg tablet Take 1 Tablet (25 mg) by mouth 3 times daily as needed for Anxiety.    lidocaine  hcl viscous (LIDOCAINE  VISCOUS) 2 % solution Swish and Spit 5 mL by mouth as directed four times daily as needed. (Patient not taking: Reported on 02/10/2024)    losartan  (COZAAR ) 50 mg tablet Take two tablets by mouth daily.    melatonin 10 mg tablet one tablet by PEG Tube route at bedtime daily. Indications: difficulty sleeping (Patient not taking: Reported on 02/10/2024)    metFORMIN  (GLUCOPHAGE ) 500 mg tablet Take one tablet by Per G Tube route twice daily with meals. Indications: type 2 diabetes mellitus    OLANZapine  (ZYPREXA ) 5 mg tablet Take 1 tablet by mouth daily at bedtime for 4 days starting on Cisplatin  infusion day.  Indications: prevent nausea and vomiting from cancer chemotherapy (Patient not taking: Reported on 02/10/2024)    ondansetron  HCL (ZOFRAN ) 8 mg tablet Take one tablet by mouth every 8 hours as needed (nausea and vomiting). Indications: prevent nausea and vomiting from cancer chemotherapy (Patient not taking: Reported on 02/10/2024)    oxyCODONE  (ROXICODONE ) 5 mg tablet Take one tablet via feeding tube every 4-6 hours as needed for Pain.    pilocarpine HCL (SALAGEN) 5 mg tablet Take one tablet by mouth.    polyethylene glycol 3350  (MIRALAX ) 17 g packet one packet by Per G Tube route daily. Indications: emptying of the bowel (Patient not taking: Reported on 02/10/2024)    rosuvastatin  (CRESTOR ) 20 mg tablet Take one tablet by PEG Tube route at bedtime daily. Indications: hardening of the arteries due to plaque buildup (Patient not taking: Reported on 02/10/2024)    sennosides-docusate sodium  (SENOKOT-S) 8.6/50 mg tablet two tablets by Per G Tube route daily. Indications: constipation (Patient not taking: Reported on 02/10/2024)    sertraline (ZOLOFT) 25 mg tablet Take one tablet by mouth daily. (Patient not taking: Reported on 02/10/2024)    sodium chloride  (SEA MIST) 0.65 % nasal spray Apply two sprays to each nostril as directed every 6 hours as needed. Indications: dryness of the nose, stuffy nose    thiamine  mononitrate (vit B1) 100 mg tablet one tablet by PEG Tube route daily. Indications: Prevention of thiamine  deficiency (Patient not taking: Reported on 02/10/2024)    traZODone  (DESYREL ) 150 mg tablet Take one-half tablet by PEG Tube route at bedtime daily. Indications: Insomnia (Patient not taking: Reported on 02/10/2024)    triamcinolone  acetonide (TRIDERM ) 0.1 % topical cream Apply one g topically to affected area twice daily. (Patient not taking: Reported on 02/10/2024)    TRULICITY 3 mg/0.5 mL injection pen Inject 0.5 mL under the skin every 7 days. Sunday     Vitals:    02/10/24 1029   BP: (!) 145/86   Pulse: 108   Resp: 18   SpO2: 99%   PainSc: Four   Weight: 100.2 kg (221 lb)       Body mass index is 30.82 kg/m?Daryl Baker     Pain Score: Four  Wt Readings from Last 5 Encounters:   02/10/24 100.2 kg (221 lb)   12/25/23 105.2 kg (232 lb)   12/16/23 101.5 kg (223 lb 12.8 oz)   12/08/23 102 kg (224 lb 13.9 oz)   12/05/23 104.8 kg (231 lb)     PET 10/03/2023        CT Neck 08/27/2023      Fatigue Scale: 0-None  KARNOFSKY PERFORMANCE SCORE:  70%     Physical Exam   General: Patient in no acute distress. Alert and oriented. Appears well.  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 He is now s/p concurrent chemoradiation 6996cGy/78fx completed 12/18/2023 with weekly cisplatin  (x5) with Dr. Patrice Books. He presents today for interval healing check.        - Pain controlled to improving-- discussed very slow taper of oxycodone  given withdrawal side effects  - Weight improving-- continue tube feeds but begin increasing PO intake as tolerated  - Scattered infarcts on inpatient MRI-- unclear etiology. Recommend monitoring BP at home. Appreciate neurology input as he has f/u later today. Will also get him in with Dr. Patrice Books for f/u  - Has f/u with Arvella Bird on 5/13-- appreciate input on nasal congestion. I recommended continuing to cut back on mucinex , continue irrigations and flonase, use saline spray PRN for dryness  - Restaging CT neck ~80mo  - Follow-up after scans with scope and post-tx NavDX    Aleta Anda, DNP, FNP-C  Nurse Practitioner, Radiation Oncology  [facility]  Available on [place] and Teams

## 2024-02-09 NOTE — Telephone Encounter
 Called and spoke with patient's wife, Daryl Baker about referral to outpatient palliative care. She states they are scheduled for a palliative care home visit tomorrow. She also had records sent to an oncologist closer to their home for convenience.    Per wife, will close referral as they will have local palliative care help.

## 2024-02-09 NOTE — Telephone Encounter
 Confirmed pt name & DOB with Stana Ear, wife.    Scheduled pt to Arvella Bird, PA-C, 05/13 to assess [place] for possible dental extractions. Reviewed appts and send them to [facility]. No further questions/concerns.

## 2024-02-10 ENCOUNTER — Encounter: Admit: 2024-02-10 | Discharge: 2024-02-10 | Payer: MEDICARE

## 2024-02-10 ENCOUNTER — Ambulatory Visit: Admit: 2024-02-10 | Discharge: 2024-02-10 | Payer: MEDICARE

## 2024-02-10 DIAGNOSIS — C109 Malignant neoplasm of oropharynx, unspecified: Secondary | ICD-10-CM

## 2024-02-10 DIAGNOSIS — R0981 Nasal congestion: Secondary | ICD-10-CM

## 2024-02-10 DIAGNOSIS — R5382 Chronic fatigue, unspecified: Secondary | ICD-10-CM

## 2024-02-10 MED ORDER — NALOXONE 4 MG/ACTUATION NA SPRY
NASAL | 1 refills | 1.00000 days | Status: AC
Start: 2024-02-10 — End: ?

## 2024-02-10 MED ORDER — BUDESONIDE 32 MCG/ACTUATION NA SPRY
1 | Freq: Every day | NASAL | 3 refills | Status: AC
Start: 2024-02-10 — End: ?

## 2024-02-10 NOTE — Progress Notes
 Chief Complaint   Patient presents with    Follow Up     Tongue mass           History of Present Illness:   Daryl Baker is a 68 y.o. year old male evaluated on 02/10/2024, in the Otolaryngology-Head and Neck Surgery Clinic at the [facility]. 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 He is now s/p concurrent chemoradiation 6996cGy/76fx completed 12/18/2023 with weekly cisplatin  (x5) with Dr. Patrice Books. Now 7 weeks out from treatment.     Returning to ENT per rad onc for dental issue.  There is an areas of exposed bone and an ulcer on the FOM or ventral tongue according to rad onc notes. Patient was seen by Aleta Anda in rad onc this morning regarding the dental issue. This has resolved on its own.     Patient states that he has nasal congestion specifically on the left. Rhinorrhea is clear. No purulence. No sinus pressure and no fever. The inability to breathe thru his nose makes him very anxious. Worse at night. Patient is taking mucinex  half tab Q4 H/ No sudafed. His pcp prescribed a po steroid but the patient has not taken it. No antihistamine. Stopped flonase. He is doing daily sinus rinses and it feels like it opens things up for a few minutes.     Patient is on a somewhat limited diet still. Soups and very soft foods.     Patient reports some difficulty swallowing, but denies trouble breathing. He is a former smoker who quit 40 years ago.  Has some voice changes, difficulty swallowing. Losing weight intentionally with trulicity. No pain when eating. No hemoptysis, breathing troubles, vision changes, changes in facial movement or sensation, weight loss.    Cardell Chang smokes 1 PPD for 10 years quit 40 years ago. Reports occasional binge drinking.  No FH of H&N cancer  Patient denies a history of OSA, chest pain, MI, CVA, Sz, DOE or DM.  No blood thinners.  The patient can walk with walker fairly unrestricted.           Past Medical/Surgical History  He  has a past medical history of DM (diabetes mellitus) (CMS-HCC), Hyperlipidemia, Hypertension, Neurogenic amyotrophy, and Neuropathy.  His  has a past surgical history that includes Abdominal hernia repair and knee arthroscopy (Left).     Past Family/Social History  His family history includes Cancer in his mother; Cancer-Lung in his mother; Diabetes in his mother; Huntington's Chorea in his maternal grandfather; Hypertension in his mother; Stroke in his father.  He  reports that he has quit smoking. His smoking use included cigarettes. He has been exposed to tobacco smoke. He has never used smokeless tobacco. He reports current alcohol use of about 1.0 standard drink of alcohol per week. He reports that he does not use drugs.     Medications/Allergies/Immunizations  His current medication(s) include:   Current Outpatient Medications   Medication Sig Dispense Refill    ALPRAZolam (XANAX) 0.5 mg tablet one tablet twice daily as needed.      aspirin  81 mg chewable tablet one tablet by PEG Tube route daily. Indications: stroke prevention 90 tablet 0    budesonide (RHINOCORT AQUA) 32 mcg/actuation nasal spray Apply one spray to each nostril as directed daily. 8.43 mL 3    dexAMETHasone  (DECADRON ) 4 mg tablet Beginning 24 hours after each Cisplatin  infusion, take 2 tablets by mouth daily  for 3 days. 36 tablet 0    fentaNYL  (DURAGESIC ) 50 mcg/hr patch Apply one patch to top of skin as directed every 72 hours. 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      gabapentin  (NEURONTIN ) 300 mg capsule Take one capsule by Per G Tube route three times daily. Indications: neuropathic pain 270 capsule 0    hydrOXYzine  HCL (ATARAX ) 25 mg tablet Take 1 Tablet (25 mg) by mouth 3 times daily as needed for Anxiety.      lidocaine  hcl viscous (LIDOCAINE  VISCOUS) 2 % solution Swish and Spit 5 mL by mouth as directed four times daily as needed. 100 mL 3    losartan  (COZAAR ) 50 mg tablet Take two tablets by mouth daily.      melatonin 10 mg tablet one tablet by PEG Tube route at bedtime daily. Indications: difficulty sleeping 90 tablet 0    metFORMIN  (GLUCOPHAGE ) 500 mg tablet Take one tablet by Per G Tube route twice daily with meals. Indications: type 2 diabetes mellitus 180 tablet 0    naloxone (NARCAN) 4 mg/actuation nasal spray Insert 1 spray into 1 nostril as needed for signs of opioid overdose then call 911. May repeat dose every 2-3 minutes (alternate nostrils) until medical team arrives. 2 each 1    OLANZapine  (ZYPREXA ) 5 mg tablet Take 1 tablet by mouth daily at bedtime for 4 days starting on Cisplatin  infusion day.  Indications: prevent nausea and vomiting from cancer chemotherapy 24 tablet 0    ondansetron  HCL (ZOFRAN ) 8 mg tablet Take one tablet by mouth every 8 hours as needed (nausea and vomiting). Indications: prevent nausea and vomiting from cancer chemotherapy 90 tablet 0    oxyCODONE  (ROXICODONE ) 5 mg tablet Take one tablet via feeding tube every 4-6 hours as needed for Pain. 120 tablet 0    pilocarpine HCL (SALAGEN) 5 mg tablet Take one tablet by mouth.      polyethylene glycol 3350  (MIRALAX ) 17 g packet one packet by Per G Tube route daily. Indications: emptying of the bowel 30 packet 0    rosuvastatin  (CRESTOR ) 20 mg tablet Take one tablet by PEG Tube route at bedtime daily. Indications: hardening of the arteries due to plaque buildup 90 tablet 0    sennosides-docusate sodium  (SENOKOT-S) 8.6/50 mg tablet two tablets by Per G Tube route daily. Indications: constipation 90 tablet 0    sertraline (ZOLOFT) 25 mg tablet Take one tablet by mouth daily.      sodium chloride  (SEA MIST) 0.65 % nasal spray Apply two sprays to each nostril as directed every 6 hours as needed. Indications: dryness of the nose, stuffy nose 44 mL 11    thiamine  mononitrate (vit B1) 100 mg tablet one tablet by PEG Tube route daily. Indications: Prevention of thiamine  deficiency 90 tablet 0    traZODone  (DESYREL ) 150 mg tablet Take one-half tablet by PEG Tube route at bedtime daily. Indications: Insomnia 90 tablet 0    triamcinolone  acetonide (TRIDERM ) 0.1 % topical cream Apply one g topically to affected area twice daily. 80 g 3    TRULICITY 3 mg/0.5 mL injection pen Inject 0.5 mL under the skin every 7 days. Sunday       No current facility-administered medications for this visit.       Allergies: Patient has no known allergies.     Review of Systems   Constitutional: Negative  for fever, weight loss and weight gain.  Skin: Negative for rash, itchiness, dryness  HENT: Negative for ear pain, sore throat and hoarseness.  Negative for difficulty swallowing.  Cardiovascular: Negative for chest pain and dyspnea on exertion (Can climb up 2 floors).   Respiratory: Is not experiencing shortness of breath.   Gastrointestinal: Negative for nausea and vomiting.   Neurological: Negative for headaches.   Lymph/Heme: Negative for lymphadenopathy or easy bruising  Musculoskeletal: Negative for joint or muscle pain  Psychiatric: The patient is not nervous/anxious.        All other systems are negative except for that listed in the HPI.      PHYSICAL EXAM:   Vital Signs:  BP 131/86 (BP Source: Arm, Left Upper, Patient Position: Sitting)  - Pulse 117  - Temp 36.6 ?C (97.8 ?F) (Temporal)  - Ht 177.8 cm (5' 10)  - Wt 100.2 kg (221 lb)  - BMI 31.71 kg/m?      General:  Well-developed, well-nourished  Communication and Voice:  Clear pitch and clarity  Hearing: Hearing adequate for verbal communication bilaterally   Inspection:  Normocephalic and atraumatic without mass or lesion  Palpation:  Facial skeleton intact without bony stepoffs  Parotid Glands:  No mass or tenderness  Facial Strength:  Facial motility symmetric and full bilaterally  Pinna:  External ear intact and fully developed  External canal:  Canal is patent with intact skin  Tympanic Membrane:  Clear and mobile  External nose:  No scar or anatomic deformity  Internal Nose:  Septum intact and midline.  No edema, polyp, or rhinorrhea. Left turbinate hypertrophy. Clear mucous bilaterally.   TMJ:  No pain to palpation with full mobility  Oral cavity, Lips, Teeth, and Gums:  Mucosa and teeth intact and viable, teeth in fair condition. There is no exposed mandible on the left palpable  Oropharynx: No erythema or exudate, clear mucous present,  non-obstructive tonsils. Strong gag reflex. BOT feels smooth and without mass. Did not palpate the tonsillar beds.   Nasopharynx:  No mass or lesion with intact mucosa  Neck, Trachea, Lymphatics:  Midline trachea without mass or lesion, no palpable LNs on exam. Mild lymphedema around level 1 neck  Thyroid:  No mass or nodularity  Eyes: No nystagmus with equal extraocular motion bilaterally  Neuro/Psych/Balance: Patient oriented and appropriate in interaction;  Appropriate mood and affect;  Gait is intact with no imbalance; Cranial nerves II-XII are intact  Respiratory effort:  Equal inspiration and expiration without stridor  Peripheral Vascular:  Warm extremities with equal pulses     After obtaining verbal consent and due to previous treatment effects the nose was sprayed with 4% lidocaine  and neosynephrine. I looked with the flexible fiberoptic laryngoscope. The nasal anatomy is normal without mass or mucosal lesion. Right nose is easy to scope. Left with inferior turbinate hypertrophy. No evidence of purulence in nose. The laryngoscope was then passed into the nasopharynx, which showed normal eustachian tube openings.  The fossae of Rosenm?ller were clear with normal elevation of the soft palate and were without any evidence of masses or lesions.  Passing the flexible scope into the oropharynx and hypopharynx revealed that the base of tongue and vallecula were without lesions. BOT is smooth and without mass.   The piriform sinuses were without lesions or any pooling of secretions or visible aspiration.  The supraglottic structures are without lesions, including the epiglottis and aryepiglottic folds. The false vocal cords and true vocal cords were without  lesions. The TVC were symmetric and mobile bilaterally, without mucosal lesions, but had some impaired mobility bilaterally due to prior XRT. The visualized part of the subglottis is clear.  There was no extrinsic mass effects in the pharynx. There was diffuse post XRT changes. The flexible fiberoptic scope was then removed.  The patient tolerated the procedure well without complications.           PATHOLOGY REVIEW:   Report Header   Date Value Ref Range Status   12/22/2023   Final    Non-gynecologic Cytology                          Case: [id number]                                  Authorizing Provider:  Tyna Gallery, MD        Collected:           12/22/2023 03:26 PM          Ordering Location:     Oncology: Allean Aran  Received:            12/23/2023 09:32 AM                                 A                                                                            Pathologist:           Ladean Picket, MD                                                         Specimen:    Lumbar Puncture                                                                             Final Diagnosis   Date Value Ref Range Status   09/19/2023   Final    A.  Lesional tissue, left neck, needle core biopsy:   Squamous cell carcinoma, HPV related.  See comment.      Attestation:  By this signature, I attest that I have personally formulated the final interpretation expressed in this report and that the above diagnosis is based upon my examination of the slides and/or other material indicated in this report.       Diagnosis Comment   Date Value Ref Range Status   09/19/2023   Final    Immunohistochemical stains showed the lesional tissue/tumor cells are positive for p16, p40 and pan CK, these findings  supporting the above diagnosis.  Pursuant to the Quality Assurance Program at the [facility] Pathology Department, selected slides from this case have been concurrently reviewed by the following pathologist: Dr. Derwood Flor who agrees with the final diagnosis.        Gross Description   Date Value Ref Range Status   12/22/2023   Final    (1 ThinPrep) 1mL clear, colorless fluid.             RADIOLOGIC REVIEW:   12/08/23 CT N  FINDINGS:     INTRACRANIAL STRUCTURES AND ORBITS: No acute abnormality.     SINUSES AND MASTOIDS: Mild left maxillary sinus mucosal thickening. Trace   left mastoid fluid.     PHARYNGEAL MUCOSA: There has been marked decrease size of the patient's   left oropharyngeal mass involving the base of the tongue glossotonsillar   sulcus and left tonsil. At the left palatine tonsil there continues to be   a centrally fluid attenuation lesion measuring 1.5 x 1.7 cm. There is   circumferential mucosal thickening compatible with post therapeutic   change. Mild asymmetric thickening at the left vallecula remains.     ORAL CAVITY: The oral tongue and floor of the mouth appear unremarkable.     LARYNX AND TRACHEA: Mild mucosal edema of the supraglottic larynx. Normal   glottic and subglottic larynx.     LYMPH NODES AND SOFT TISSUES: Decrease in size of bilateral cervical   lymphadenopathy which previously was hypermetabolic on PET scan imaging.   For reference the prior dominant left level 2 lymph node which measured   4.6 x 3.0 cm in November 2024 today measures 2.3 x 1.1 cm.     SALIVARY GLANDS: Normal parotid and submandibular glands.     THYROID: Normal.     VESSELS AND CAROTID SPACE: Indwelling right IJ chest port. Vasculature is   otherwise unremarkable.     BONES: Moderate to marked cervical spondylosis.     UPPER THORAX: Visualized lung apices are clear.     IMPRESSION     1. Marked decrease size of the prior large left oropharyngeal mass. There   is a residual 1.7 x 1.5 cm lesion involving the left tonsil with central   fluid attenuation. Treated necrotic tumor is the leading consideration. A   post therapeutic retention cyst could appear similar. An abscess is   thought less likely though direct visualization and clinical correlation   is recommended regarding symptoms of infection.   2.  Improvement of bilateral cervical lymphadenopathy.             Assessment and Plan:   My impression is that Mr. 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 He is now s/p concurrent chemoradiation 6996cGy/46fx completed 12/18/2023 with weekly cisplatin  (x5) with Dr. Patrice Books. 7 weeks out.     For congestion, continue mucinex . Start medrol  prescribed by PCP. Start rhinocort nasal spray. Continue sinus irrigations.   There is no evidence of disease on the flexible scope today. No nasal obstructions present. Left turbinate hypertrophy present.

## 2024-02-10 NOTE — Progress Notes
 Clinical Nutrition Assessment Summary    Daryl Baker is a 68 y.o. 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, AJCC 8th Edition  - Clinical stage from 10/14/2023: Stage II (cT3, cN2, cM0, p16+) -     Past Medical History: Reviewed    Current Treatment Plan: OP HEAD/NECK CISPLATIN  (WEEKLY - 40MG /M2) + XRT     Nutrition Assessment of Patient:       Wt Readings from Last 5 Encounters:   12/25/23 105.2 kg (232 lb)   12/16/23 101.5 kg (223 lb 12.8 oz)   12/08/23 102 kg (224 lb 13.9 oz)   12/05/23 104.8 kg (231 lb)   12/05/23 105.7 kg (233 lb)        There is no height or weight on file to calculate BMI.    RD spoke with wife on the phone today. Wife reports EN is going well and feels they finally have a good schedule and plan.  Pt having no tolerance issues.  Wife reports no nutrition concerns today.  RD will follow.     TwoCal HN, total of 5.5 cartons daily.   1 carton (237 mL) per feed QID + 1.5 carton (~356 mL) per feed 1x/day.  This provides 2613 kcal/day, 109g protein/day, and free water /day.   At least 30mL free water  flush before and after each bolus feed. Additional fluids per primary team recs.  Avoid using plunger of syringe when administering bolus feeds- administer via gravity.        DME: [employer]      Nutrition Monitoring and Evaluation:  Goal:< 10% wt loss starting weight (245 lbs)  Time Frame:Throughout Treatment and Recovery  -7lbs ( 3%)     The patient was allowed to ask questions and actively participated in creating plan of care. I provided the patient with my contact information and instructed pt on how to get in touch with me if questions or concerns arise. Thank you for allowing nutrition services to participate in this patients care.     Follow up Date: in clinic    [place], [place], RD, LD  Phone:[phone]

## 2024-02-10 NOTE — Patient Instructions
 Your Radiation Oncology Team:  Erin Angotti APRN-NP / Anastacio Balm, RN   Dr. Olive Better / Carter Clare, RN     MyChart is the most convenient way to communicate with your care team with secure online access to your medical information. Please inquire about signing up if you have not already.     All prescription requests and refills require at least 24 hours.      All paperwork including FMLA, disability, letters, etc require at least one week to complete. Paperwork can be delivered to the front desk, sent via MyChart, or faxed to [phone].    Scheduling:  Clinic appointments: [phone].    Medical/Clinical Questions:  [phone] or [phone]    After Hours (4pm - 8am, weekends, & holidays): [phone]   Ask for your doctor to be paged and either he or the on-call MD will call you back

## 2024-02-20 ENCOUNTER — Encounter: Admit: 2024-02-20 | Discharge: 2024-02-20 | Payer: MEDICARE

## 2024-03-01 ENCOUNTER — Encounter: Admit: 2024-03-01 | Discharge: 2024-03-01 | Payer: MEDICARE

## 2024-03-01 NOTE — Progress Notes
 Clinical Nutrition Assessment Summary    Daryl Baker is a 68 y.o. 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, AJCC 8th Edition  - Clinical stage from 10/14/2023: Stage II (cT3, cN2, cM0, p16+) -     Past Medical History: Reviewed    Current Treatment Plan: OP HEAD/NECK CISPLATIN  (WEEKLY - 40MG /M2) + XRT     Nutrition Assessment of Patient:       Wt Readings from Last 5 Encounters:   02/10/24 100.2 kg (221 lb)   02/10/24 99.8 kg (220 lb)   02/10/24 100.2 kg (221 lb)   12/25/23 105.2 kg (232 lb)   12/16/23 101.5 kg (223 lb 12.8 oz)        There is no height or weight on file to calculate BMI.    RD spoke with wife on phone today.  Wife reports have run out of TwoCal.  Wife reports left message with [employer] and awaiting call back.  RD discussed has TwoCal in clinic that could give to pt and wife reports they live 2 hours away and don't plan to be in clinic for another week.  RD discussed can provide Boost plus or Ensure plus, wife reports have done this in the past and will do this as needed.  RD will remain available.     TwoCal HN, total of 5.5 cartons daily.   1 carton (237 mL) per feed QID + 1.5 carton (~356 mL) per feed 1x/day.  This provides 2613 kcal/day, 109g protein/day, and free water /day.   At least 30mL free water  flush before and after each bolus feed. Additional fluids per primary team recs.  Avoid using plunger of syringe when administering bolus feeds- administer via gravity.        DME: [employer]      Nutrition Monitoring and Evaluation:  Goal:< 10% wt loss starting weight (245 lbs)  Time Frame:Throughout Treatment and Recovery  -7lbs ( 3%)     The patient was allowed to ask questions and actively participated in creating plan of care. I provided the patient with my contact information and instructed pt on how to get in touch with me if questions or concerns arise. Thank you for allowing nutrition services to participate in this patients care.     Follow up Date: in clinic    [place], [place], RD, LD  Phone:[phone]

## 2024-03-02 ENCOUNTER — Encounter: Admit: 2024-03-02 | Discharge: 2024-03-02 | Payer: MEDICARE

## 2024-03-08 ENCOUNTER — Encounter: Admit: 2024-03-08 | Discharge: 2024-03-08 | Payer: MEDICARE

## 2024-03-09 ENCOUNTER — Ambulatory Visit: Admit: 2024-03-09 | Discharge: 2024-03-10 | Payer: MEDICARE

## 2024-03-09 ENCOUNTER — Encounter: Admit: 2024-03-09 | Discharge: 2024-03-09 | Payer: MEDICARE

## 2024-03-09 ENCOUNTER — Ambulatory Visit: Admit: 2024-03-09 | Discharge: 2024-03-09 | Payer: MEDICARE

## 2024-03-09 DIAGNOSIS — C109 Malignant neoplasm of oropharynx, unspecified: Secondary | ICD-10-CM

## 2024-03-09 MED ORDER — OXYCODONE 5 MG PO TAB
5 mg | ORAL_TABLET | GASTROSTOMY | 0 refills | 6.00000 days | Status: AC | PRN
Start: 2024-03-09 — End: ?

## 2024-03-09 NOTE — Progress Notes
 EMG/NCS Procedure Time Out Check List:  Prior to the start of the procedure, I personally confirmed the following:      Patient Identity (name & date of birth): Yes  Procedure: Yes  Site: Yes  Body Part: Yes    Time out was performed. On the verbal pain analog scale of 0 to 10, patient's pain level was 0 before the procedure and 0 afterwards. Test was completed without complication.     The risks of the procedure, including pain, were discussed with the patient.

## 2024-03-10 ENCOUNTER — Encounter: Admit: 2024-03-10 | Discharge: 2024-03-10 | Payer: MEDICARE

## 2024-03-10 DIAGNOSIS — E1142 Type 2 diabetes mellitus with diabetic polyneuropathy: Secondary | ICD-10-CM

## 2024-03-10 NOTE — Progress Notes
 Vascular Neurology Clinic Note    Encounter Date: 03/12/2024    Subjective:    Daryl Baker is a 68 y.o.  Caucasian male with a PMH significant for oropharyngeal cancer, HTN, and DM  . he was seen today for a consultative service/referral regarding hospital follow-up for recent CVA.     Brief HPI:  He presented to [facility] on 12/08/2023 for mouth pain, difficulty eating, and hallucinations in the setting of radiation for his oropharyngeal cancer.  He underwent G-tube placement on 03/12.  Hospital course was complicated by acute encephalopathy and upper respiratory infection secondary to SARS-COV-2.  He underwent video EEG, which revealed encephalopathy.  MRI under anesthesia revealed scattered small acute-early subacute bilateral cerebral white matter infarcts in the bilateral corona radiata, centrum semiovale and bilateral, and parietal regions.  Carotid duplex was negative for significant stenosis.  CTA head revealed mild bilateral stenosis of carotid siphons and mild left vertebral artery stenosis.  Echocardiogram revealed normal LVSF with ejection fraction of 60% with no regional wall abnormalities.  No obvious right-left shunting.  He was seen by neurology who felt pattern of stroke appeared similar to watershed/borders on stroke and hypoperfusion or embolic source.  He was placed on aspirin  and recommended outpatient M cot.    Interval History:  Daryl Baker presents today via telehealth with his wife, Daryl Baker. He reports he is doing quite well. He continues to use a walker (which he was using prior to his hospitalizations) and is independent in all ADL's.   He tells me that he is pretty sure that his cancer is in remission, although he is waiting to hear back from his oncology team for sure. Says that he has not been on rosuvastatin  or aspirin  since 04/04; Most recent lipid panel from May reveals LDL of 55. Reports he was not on statin prior to these events either.     Functional Status: Daryl Baker modified Rankin score is 0  mRankin Scale  0= No symptoms at all  1= No significant disability despite symptoms; able to carry out all usual duties and activities  2 =Slight disability; unable to carry out all previous activities, but able to look after own affairs without assistance  3 =Moderate disability; requiring some help, but able to walk without assistance   4=Moderately severe disability; unable to walk without assistance and unable to attend to own bodily needs without assistance  5=Severe disability; bedridden, incontinent and requiring constant nursing care and attention    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      aspirin  81 mg chewable tablet one tablet by PEG Tube  route daily. Indications: stroke prevention 90 tablet 0    budesonide  (RHINOCORT  AQUA) 32 mcg/actuation nasal spray Apply one spray to each nostril as directed daily. 8.43 mL 3    dexAMETHasone  (DECADRON ) 4 mg tablet Beginning 24 hours after each Cisplatin  infusion, take 2 tablets by mouth daily for 3 days. 36 tablet 0    fentaNYL  (DURAGESIC ) 50 mcg/hr patch Apply one patch to top of skin as directed every 72 hours. 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      gabapentin  (NEURONTIN ) 300 mg capsule Take one capsule by Per G Tube route three times daily. Indications: neuropathic pain 270 capsule 0    hydrOXYzine  HCL (ATARAX ) 25 mg tablet Take 1 Tablet (25 mg) by mouth 3 times daily as needed for Anxiety.      lidocaine  hcl viscous (LIDOCAINE  VISCOUS) 2 % solution Swish and Spit 5 mL by mouth as directed four times daily as needed. 100 mL 3    losartan  (COZAAR ) 50 mg tablet Take two tablets by mouth daily.      melatonin 10 mg tablet one tablet by PEG Tube route at bedtime daily. Indications: difficulty sleeping 90 tablet 0    metFORMIN  (GLUCOPHAGE ) 500 mg tablet Take one tablet by Per G Tube route twice daily with meals. Indications: type 2 diabetes mellitus 180 tablet 0    naloxone  (NARCAN ) 4 mg/actuation nasal spray Insert 1 spray into 1 nostril as needed for signs of opioid overdose then call 911. May repeat dose every 2-3 minutes (alternate nostrils) until medical team arrives. 2 each 1    OLANZapine  (ZYPREXA ) 5 mg tablet Take 1 tablet by mouth daily at bedtime for 4 days starting on Cisplatin  infusion day.  Indications: prevent nausea and vomiting from cancer chemotherapy 24 tablet 0    ondansetron  HCL (ZOFRAN ) 8 mg tablet Take one tablet by mouth every 8 hours as needed (nausea and vomiting). Indications: prevent nausea and vomiting from cancer chemotherapy 90 tablet 0    oxyCODONE  (ROXICODONE ) 5 mg tablet Take one tablet via feeding tube every 4-6 hours as needed for Pain. 120 tablet 0    pilocarpine HCL (SALAGEN) 5 mg tablet Take one tablet by mouth.      polyethylene glycol 3350  (MIRALAX ) 17 g packet one packet by Per G Tube route daily. Indications: emptying of the bowel 30 packet 0    rosuvastatin  (CRESTOR ) 20 mg tablet Take one tablet by PEG Tube route at bedtime daily. Indications: hardening of the arteries due to plaque buildup 90 tablet 0    sennosides-docusate sodium  (SENOKOT-S) 8.6/50 mg tablet two tablets by Per G Tube route daily. Indications: constipation 90 tablet 0    sertraline (ZOLOFT) 25 mg tablet Take one tablet by mouth daily.      sodium chloride  (SEA MIST) 0.65 % nasal spray Apply two sprays to each nostril as directed every 6 hours as needed. Indications: dryness of the nose, stuffy nose 44 mL 11    thiamine  mononitrate (vit B1) 100 mg tablet one tablet by PEG Tube route daily. Indications: Prevention of thiamine  deficiency 90 tablet 0    traZODone  (DESYREL ) 150 mg tablet Take one-half tablet by PEG Tube route at bedtime daily. Indications: Insomnia 90 tablet 0    triamcinolone  acetonide (TRIDERM ) 0.1 % topical cream Apply one g topically to affected area twice daily. 80 g 3    TRULICITY  3 mg/0.5 mL injection pen Inject 0.5 mL under the skin every 7 days. Sunday       No current facility-administered medications for this visit.     No Known Allergies  Social History     Socioeconomic History    Marital status: Married   Tobacco Use    Smoking status: Former     Current packs/day: 1.00     Average packs/day: 0.5 packs/day for 3.0 years (1.5 ttl pk-yrs)     Types: Cigarettes     Passive exposure: Past    Smokeless tobacco: Never   Vaping Use    Vaping status: Never Used   Substance and Sexual Activity    Alcohol use: Not Currently     Alcohol/week: 2.0 standard drinks of alcohol     Types: 2 Shots of liquor per week    Drug use: Never    Sexual activity: Not Currently     Partners: Female   Social History Narrative    He is a Naval architect.  He smoked almost 40 years ago.  Occasional binging with EtOH.           Review of Systems    10 systems were reviewed and all negative except as mentioned above and in HPI.    Objective:    General appearance: alert, cooperative, no distress, appears stated age  Head: Normocephalic, without obvious abnormality  Eyes: conjunctivae/corneas clear.  Lungs: breathing comfortably, no audible wheezing  Heart: regular rate and rhythm  Extremities: extremities normal, atraumatic  Skin: no rash    Neuro Exam  Mental Status:     Orientation:  Alert. Oriented to month, year, hospital.     Language:                Fluency:  Speech is fluent and clear.   Naming: intact   Repetition: intact              Comprehension: Able to follow commands given using verbal language alone.       Neglect: No neglect on visual double simultaneous presentation.   Cranial Nerves:   CN II: Visual fields were full  CN VII: Muscles of facial expression were strong and symmetric with no evidence of facial droop.  CN VIII:  Hearing was normal bilaterally.    CN IX, X: Palate elevates symmetrically.   CN XII: The tongue protrudes midline without atrophy or fasciculations.      Motor - able to move all 4 extremities equally without issue;    Sensation:    Light touch: intact; numbness in fingers; likely neuropathy    WORKUP:      1   MRI HEAD WO/W CONTRAST    Addendum: 12/31/2023     Finalized by Donnamae Gaba, D.O. on 12/22/2023 2:46 PM. Dictated by Donnamae Gaba, D.O. on 12/22/2023 2:33 PM.Addendum:  Correction to technique which should read: Multiplanar and multisequence MR imaging of the head was performed prior to and after the administration of IV contrast.       Finalized by Donnamae Gaba, D.O. on 12/31/2023 12:33 PM. Dictated by Donnamae Gaba, D.O. on 12/31/2023 12:32 PM.      Narrative         EXAM: MRI BRAIN     HISTORY: Oropharyngeal cancer history, altered mental status    TECHNIQUE: Multiplanar and multisequence MR imaging of the head was performed.       COMPARISON: CT head 12/08/2023    FINDINGS:  Scattered small acute to early subacute bilateral cerebral white matter infarcts, which are predominantly located within the bilateral corona radiata, centrum semiovale and periatrial regions. Additional small subcortical infarct along at the junction of the medial right precentral gyrus and superior frontal gyrus (series 4, image 26).    Stable mild generalized cerebral volume loss with concordant prominence of the ventricles and extra-axial spaces. Additional mild patchy cerebral white matter FLAIR hyperintensities. No abnormal enhancing intracranial lesion is identified. No midline shift or mass effect. The major intracranial arterial flow voids are preserved.     Minimal left maxillary alveolar recess mucosal thickening. Mild layering pharyngeal secretions with partially visualized indwelling support device. Previously treated left oropharyngeal mass and cervical adenopathy much better evaluated on prior dedicated CT neck.      Impression    1.  Scattered small acute to subacute bilateral cerebral white matter infarcts. No significant intracranial mass effect.  2.  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    Findings were communicated with Dr. Hosie Macadamia by Dr. Pastor Bong via phone at 2:45 PM on 12/22/2023.         Assessment:  Daryl Baker is a 68 y.o. with a past medical history significant for oropharyngeal cancer, hypertension, and diabetes who presents for evaluation of CVA in March of this year.   He developed acute onset of encephalopathy, which was initially thought to be secondary to medication and radiation treatment for his oropharyngeal cancer.   However, MRI brain revealed scattered small acute/subacute bilateral cerebral white matter infarcts with no significant mass effect.   Due to several of the areas of stroke being in the border zone territory it was thought his CVA was secondary to hypoperfusion and he was started on aspirin . However, given those areas as well additional cortical areas of infarct, it is more likely these were secondary to hypercoaguable state with his active cancer.   Patient is currently unsure if his cancer is active or in remission.   We discussed that if cancer is active, would recommend Xarelto 20 mg daily for secondary stroke prevention. If cancer is deemed to be in remission by his oncology team, then ok to be on asa. Given stroke was likely hypercoaguable and LDL <70 ok to be off statin for now.   Will reach out to his oncologist to find out if cancer is currently active or in remission    Plan:  Workup:  None further at this time     Referrals:  None    Medications and management:  - IF cancer is still active: Xarelto 20 for secondary stroke prevention while has active malignancy; once deemed by oncology to no longer have active malignancy can stop Xarelto and start on aspirin  81 mg daily at that time.   -Ok to be off statin if LDL remains <70. If >70 would recommend starting statin  -A1C goal < 7.0    Return to Neurology clinic:  PRN     Luther Saltness, APRN-NP    I spent more than 55 mins in this encounter, more than half spent on counseling, explaining diagnosis, management and plan for acute ischemic stroke

## 2024-03-11 ENCOUNTER — Encounter: Admit: 2024-03-11 | Discharge: 2024-03-11 | Payer: MEDICARE

## 2024-03-11 NOTE — Telephone Encounter
 Wife called and left a message that she would prefer to have the appointment tomorrow via TeleHealth since they live a ways away.  I confirmed with Loris Ros that it would be alright. I changed the appointment and called her back to let her know.  She stated she knew how to do a Telehealth appointment or have children that can help her.    Jarrett Merry, RN

## 2024-03-12 ENCOUNTER — Encounter: Admit: 2024-03-12 | Discharge: 2024-03-12 | Payer: MEDICARE

## 2024-03-12 ENCOUNTER — Ambulatory Visit: Admit: 2024-03-12 | Discharge: 2024-03-13 | Payer: MEDICARE

## 2024-03-12 DIAGNOSIS — I639 Cerebral infarction, unspecified: Secondary | ICD-10-CM

## 2024-03-12 DIAGNOSIS — E1144 Type 2 diabetes mellitus with diabetic amyotrophy: Secondary | ICD-10-CM

## 2024-03-12 DIAGNOSIS — C109 Malignant neoplasm of oropharynx, unspecified: Secondary | ICD-10-CM

## 2024-03-12 NOTE — Telephone Encounter
 Pt called back asking for results.  Advised that Cleveland Dales or Carter Clare will get back with him with that answer. Verbs understanding.

## 2024-03-12 NOTE — Telephone Encounter
 Patient called clinic asking to speak with [place] Dales or [place].  Informed patient Cleveland Dales is not in clinic on Fridays and Carter Clare is caring for another patient.     Patient states he is wanting to know if someone can tell him the results of recent imaging and if his cancer is gone.  Patient states the answer to this question impacts the medications he will be prescribed from his stroke doctor.    Advised patient will forward message to [facility] and team and he will receive a return call.    Patient expressed understanding and appreciation.

## 2024-03-12 NOTE — Telephone Encounter
 Called and spoke to [place] about Dr. Gwyn Leos recommendations.

## 2024-03-17 ENCOUNTER — Encounter: Admit: 2024-03-17 | Discharge: 2024-03-17 | Payer: MEDICARE

## 2024-03-17 ENCOUNTER — Ambulatory Visit: Admit: 2024-03-17 | Discharge: 2024-03-17 | Payer: MEDICARE

## 2024-03-17 MED ORDER — RIVAROXABAN 20 MG PO TAB
20 mg | ORAL_TABLET | Freq: Every day | ORAL | 3 refills | 30.00000 days | Status: AC
Start: 2024-03-17 — End: ?

## 2024-03-17 NOTE — Telephone Encounter
 I spoke with patient's oncology team, (NP, Erin Angotti and Dr. Gwyn Leos) regarding patient's current cancer status. They reported that could not safely say patient is in remission at this point and they would recommend err'r on the side of caution for him being a hypercoaguable state. Reported they will likely repeat imaging in a few months for a better answer.   Called patient and he reports he was notified by oncology his scan looked good, but he was not considered in remission. We discussed that the recommendation is then to stop the Aspirin  he is taking and start Xarelto 20 mg daily, which we will continue until he is considered in remission.   He is understanding and agreeable to this as long as the medication is affordable.   Prescription sent to patient's pharmacy of choice.   Will have patient follow-up with me in 6 months.     Mariel Shope, NP

## 2024-03-17 NOTE — Telephone Encounter
 Called and LVM trying to set up a follow-up appointment in 6 months (December), per Mariel Shope' request, with the start of the new medication, Xarelto.  Asked him to either call me or the clinic scheduling to do set up that appointment .    Jarrett Merry, RN

## 2024-03-17 NOTE — Progress Notes
 Clinical Nutrition Assessment Summary    Daryl Baker is a 68 y.o. 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, AJCC 8th Edition  - Clinical stage from 10/14/2023: Stage II (cT3, cN2, cM0, p16+) -     Past Medical History: Reviewed    Current Treatment Plan: OP HEAD/NECK CISPLATIN  (WEEKLY - 40MG /M2) + XRT     Nutrition Assessment of Patient:       Wt Readings from Last 5 Encounters:   03/09/24 100.2 kg (221 lb)   02/10/24 100.2 kg (221 lb)   02/10/24 99.8 kg (220 lb)   02/10/24 100.2 kg (221 lb)   12/25/23 105.2 kg (232 lb)        There is no height or weight on file to calculate BMI.    RD spoke with wife on the phone today.  Wife reports that patient has been having severe diarrhea after each bolus feed and that they thought was related to medication but now believe it is the tube feeds.  Wife reports has been provide 2 cartons of TwoCal BID and 1 1/2 cartons once a day.  Pt is now consuming some foods orally but still relying on G tube to meet > 50% of needs.  RD discussed going down to providing 1 carton each feed and provided Nutrisource packets BID, RD discussed maximum of 6 packets of Nutrisource daily and can increase to 1 packet with each bolus if needed.  If addition of fiber does not help recommend trial [employer] 1.4 plant based formula.    TwoCal HN, total of 5.5 cartons daily.   1 carton (237 mL) per feed QID + 1.5 carton (~356 mL) per feed 1x/day.  This provides 2613 kcal/day, 109g protein/day, and free water /day.   At least 30mL free water  flush before and after each bolus feed. Additional fluids per primary team recs.  Avoid using plunger of syringe when administering bolus feeds- administer via gravity.      **Add Fibersource BID and increase as needed to max of 6 packets a day**    DME: [employer]      Nutrition Monitoring and Evaluation:  Goal:< 10% wt loss starting weight (245 lbs)  Time Frame:Throughout Treatment and Recovery  -7lbs ( 3%)     The patient was allowed to ask questions and actively participated in creating plan of care. I provided the patient with my contact information and instructed pt on how to get in touch with me if questions or concerns arise. Thank you for allowing nutrition services to participate in this patients care.     Follow up Date: on going    [place], [place], RD, LD  Phone:[phone]

## 2024-03-22 ENCOUNTER — Encounter: Admit: 2024-03-22 | Discharge: 2024-03-22 | Payer: MEDICARE

## 2024-03-22 NOTE — Telephone Encounter
 [facility] Test Result    Collection Date: June 10th 2025  Sample Type: blood  Status: post-treatment  Tumor Tissue Modified Viral (TTMV) - HPV DNA Score: Negative     [facility] Barcode: [id number]        Florence Standing to relay the results of the above lab, no answer - LVM and MyChart message sent.

## 2024-03-24 ENCOUNTER — Ambulatory Visit: Admit: 2024-03-24 | Discharge: 2024-03-24 | Payer: MEDICARE

## 2024-03-24 NOTE — Progress Notes
 RD attempted to reach pt by phone.  LVM with call back number.  RD will follow.    Hart Robinsons, MS, RD, LD  Phone:[phone]

## 2024-03-31 ENCOUNTER — Ambulatory Visit: Admit: 2024-03-31 | Discharge: 2024-03-31 | Payer: MEDICARE

## 2024-03-31 NOTE — Progress Notes
 RD attempted to reach pt by phone.  LVM with call back number.  RD will follow.    Hart Robinsons, MS, RD, LD  Phone:[phone]

## 2024-04-01 ENCOUNTER — Encounter: Admit: 2024-04-01 | Discharge: 2024-04-01 | Payer: MEDICARE

## 2024-04-06 ENCOUNTER — Encounter: Admit: 2024-04-06 | Discharge: 2024-04-06 | Payer: MEDICARE

## 2024-04-09 ENCOUNTER — Ambulatory Visit: Admit: 2024-04-09 | Discharge: 2024-04-09 | Payer: MEDICARE

## 2024-04-09 NOTE — Progress Notes
 Clinical Nutrition Assessment Summary    Daryl Baker is a 68 y.o. 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, AJCC 8th Edition  - Clinical stage from 10/14/2023: Stage II (cT3, cN2, cM0, p16+) -     Past Medical History: Reviewed    Current Treatment Plan: OP HEAD/NECK CISPLATIN  (WEEKLY - 40MG /M2) + XRT     Nutrition Assessment of Patient:       Wt Readings from Last 5 Encounters:   03/09/24 100.2 kg (221 lb)   02/10/24 100.2 kg (221 lb)   02/10/24 99.8 kg (220 lb)   02/10/24 100.2 kg (221 lb)   12/25/23 105.2 kg (232 lb)        There is no height or weight on file to calculate BMI.    RD spoke with wife on the phone. RD discussed spoke with [employer] and re-ordered 1/2 order, wife to follow up with [employer] today. Plan is to trial  Nutrisource Fiber 2 up to 6 packets a day.  If this does not help will change formula to [facility] 1.4 or other fiber containing formula as tolerated.     TwoCal HN, total of 5.5 cartons daily.   1 carton (237 mL) per feed QID + 1.5 carton (~356 mL) per feed 1x/day.  This provides 2613 kcal/day, 109g protein/day, and free water /day.   At least 30mL free water  flush before and after each bolus feed. Additional fluids per primary team recs.  Avoid using plunger of syringe when administering bolus feeds- administer via gravity.      **Add Fibersource BID and increase as needed to max of 6 packets a day**    DME: [employer]      Nutrition Monitoring and Evaluation:  Goal:< 10% wt loss starting weight (245 lbs)  Time Frame:Throughout Treatment and Recovery  -7lbs ( 3%)     The patient was allowed to ask questions and actively participated in creating plan of care. I provided the patient with my contact information and instructed pt on how to get in touch with me if questions or concerns arise. Thank you for allowing nutrition services to participate in this patients care.     Follow up Date: on going    [name], [place], RD, LD  Phone:[phone]

## 2024-04-16 ENCOUNTER — Ambulatory Visit: Admit: 2024-04-16 | Discharge: 2024-04-16 | Payer: MEDICARE

## 2024-04-16 NOTE — Progress Notes
 RD attempted to reach pt's wife by phone.  Unable to leave message as mailbox was full.  RD will follow.    [name], MS, RD, LD  Phone:[phone]

## 2024-04-19 ENCOUNTER — Ambulatory Visit: Admit: 2024-04-19 | Discharge: 2024-04-19 | Payer: MEDICARE

## 2024-04-19 ENCOUNTER — Encounter: Admit: 2024-04-19 | Discharge: 2024-04-19 | Payer: MEDICARE

## 2024-04-19 NOTE — Progress Notes
 RD attempted to reach pt's wife by phone, mailbox full and unable to leave message.  RD sent message to patient and wife via Mychart to assess EN tolerance.  RD will follow.    [name], MS, RD, LD  [phone]

## 2024-04-26 ENCOUNTER — Ambulatory Visit: Admit: 2024-04-26 | Discharge: 2024-04-26 | Payer: MEDICARE

## 2024-04-26 NOTE — Progress Notes
 Clinical Nutrition Assessment Summary    Daryl Baker is a 68 y.o. 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, AJCC 8th Edition  - Clinical stage from 10/14/2023: Stage II (cT3, cN2, cM0, p16+) -     Past Medical History: Reviewed    Current Treatment Plan: OP HEAD/NECK CISPLATIN  (WEEKLY - 40MG /M2) + XRT     Nutrition Assessment of Patient:       Wt Readings from Last 5 Encounters:   03/09/24 100.2 kg (221 lb)   02/10/24 100.2 kg (221 lb)   02/10/24 99.8 kg (220 lb)   02/10/24 100.2 kg (221 lb)   12/25/23 105.2 kg (232 lb)        There is no height or weight on file to calculate BMI.    RD spoke with wife on the phone today.  Wife reports were providing Fibersource packets BID however pt was becoming constipated so they have cut back to 1 packet.  Wife reports working on increasing oral intake, reports pt takes bites of apple sauce between meals that helps moisten throat and makes it easier to swallow.  Wife reports is drinking protein shakes and has but back to 4 cartons of TwoCal a day.  RD called [employer] to order the other 1/2 of this months EN order.  RD will follow.     TwoCal HN, total of 5.5 cartons daily.   1 carton (237 mL) per feed QID + 1.5 carton (~356 mL) per feed 1x/day.  This provides 2613 kcal/day, 109g protein/day, and free water /day.   At least 30mL free water  flush before and after each bolus feed. Additional fluids per primary team recs.  Avoid using plunger of syringe when administering bolus feeds- administer via gravity.      **Add Fibersource BID and increase as needed to max of 6 packets a day**    DME: [employer]      Nutrition Monitoring and Evaluation:  Goal:< 10% wt loss starting weight (245 lbs)  Time Frame:Throughout Treatment and Recovery  -7lbs ( 3%)     The patient was allowed to ask questions and actively participated in creating plan of care. I provided the patient with my contact information and instructed pt on how to get in touch with me if questions or concerns arise. Thank you for allowing nutrition services to participate in this patients care.     Follow up Date: on going    [name], [place], RD, LD  [phone]

## 2024-04-28 ENCOUNTER — Encounter: Admit: 2024-04-28 | Discharge: 2024-04-28 | Payer: MEDICARE

## 2024-04-29 ENCOUNTER — Encounter: Admit: 2024-04-29 | Discharge: 2024-04-29 | Payer: MEDICARE

## 2024-04-29 DIAGNOSIS — C109 Malignant neoplasm of oropharynx, unspecified: Principal | ICD-10-CM

## 2024-04-29 MED ORDER — OXYCODONE 5 MG PO TAB
5 mg | ORAL_TABLET | GASTROSTOMY | 0 refills | 6.00000 days | Status: AC | PRN
Start: 2024-04-29 — End: ?

## 2024-05-12 ENCOUNTER — Ambulatory Visit: Admit: 2024-05-12 | Discharge: 2024-05-12 | Payer: MEDICARE

## 2024-05-12 ENCOUNTER — Encounter: Admit: 2024-05-12 | Discharge: 2024-05-12 | Payer: MEDICARE

## 2024-05-12 DIAGNOSIS — C109 Malignant neoplasm of oropharynx, unspecified: Secondary | ICD-10-CM

## 2024-05-12 DIAGNOSIS — J392 Other diseases of pharynx: Secondary | ICD-10-CM

## 2024-05-12 NOTE — Progress Notes
 Clinical Nutrition Assessment Summary    Tod Abrahamsen is a 68 y.o. 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, AJCC 8th Edition  - Clinical stage from 10/14/2023: Stage II (cT3, cN2, cM0, p16+) -     Past Medical History: Reviewed    Current Treatment Plan: OP HEAD/NECK CISPLATIN  (WEEKLY - 40MG /M2) + XRT     Nutrition Assessment of Patient:       Wt Readings from Last 5 Encounters:   03/09/24 100.2 kg (221 lb)   02/10/24 100.2 kg (221 lb)   02/10/24 99.8 kg (220 lb)   02/10/24 100.2 kg (221 lb)   12/25/23 105.2 kg (232 lb)        There is no height or weight on file to calculate BMI.    RD spoke with pt on phone today, pt request RD call wife.  RD spoke with pts wife on the phone today.  Per wife pt had G tube removed and now consuming small amounts of food orally, however relying on drining 4-5 Nutren 2.0 daily to meet > 75% of nutrition needs.  Wife reports has been adding a little flavoring to Nutren 2.0.  RD discussed Boost VHC an oral supplement that comes in Vanilla, Chocolate or Strawberry and asked if pt might prefer this.  Wife stated pt would like strawberry.  RD to send oral supplement order to [employer].  RD will follow.     DME: [employer]      Nutrition Monitoring and Evaluation:  Goal:< 10% wt loss starting weight (245 lbs)  Time Frame:Throughout Treatment and Recovery  -7lbs ( 3%)     The patient was allowed to ask questions and actively participated in creating plan of care. I provided the patient with my contact information and instructed pt on how to get in touch with me if questions or concerns arise. Thank you for allowing nutrition services to participate in this patients care.     Follow up Date: on going    [name], [place], RD, LD  [phone]

## 2024-05-24 ENCOUNTER — Ambulatory Visit: Admit: 2024-05-24 | Discharge: 2024-05-24 | Payer: MEDICARE

## 2024-05-24 NOTE — Progress Notes
 Clinical Nutrition Assessment Summary    Daryl Baker is a 68 y.o. 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, AJCC 8th Edition  - Clinical stage from 10/14/2023: Stage II (cT3, cN2, cM0, p16+) -     Past Medical History: Reviewed    Current Treatment Plan: OP HEAD/NECK CISPLATIN  (WEEKLY - 40MG /M2) + XRT     Nutrition Assessment of Patient:       Wt Readings from Last 5 Encounters:   03/09/24 100.2 kg (221 lb)   02/10/24 100.2 kg (221 lb)   02/10/24 99.8 kg (220 lb)   02/10/24 100.2 kg (221 lb)   12/25/23 105.2 kg (232 lb)        There is no height or weight on file to calculate BMI.    RD spoke with wife on the phone today.  Pt continues to drink Nutren 2.0 4-5 a day and doing better with oral intake, trying a variety of foods.  RD will follow.     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 I provided the patient with my contact information and instructed pt on how to get in touch with me if questions or concerns arise. Thank you for allowing nutrition services to participate in this patients care.     Follow up Date: on going    [name], [place], RD, LD  [phone]

## 2024-06-03 ENCOUNTER — Encounter: Admit: 2024-06-03 | Discharge: 2024-06-03 | Payer: MEDICARE

## 2024-06-03 NOTE — Progress Notes
 Radiation Oncology Follow Up Note   Date: 06/14/2024       Daryl Baker is a 68 y.o. male.     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 He is now s/p concurrent chemoradiation 6996cGy/78fx completed 12/18/2023 with weekly cisplatin  (x5) with Dr. Timoteo. He presents today for interval follow-up.    PET 06/14/2024  IMPRESSION  Post therapeutic changes throughout the neck.     No discrete residual hypermetabolic oropharyngeal mass or cervical nodal metastatic disease.     No PET evidence of distant metastatic disease.     Interim History:  LOV 6/10  Daryl Baker 5/13    Dry mouth is somewhat better but not much. He can power through it. Still taking pilocarpine  TID. Does get hot flashes in the middle of the night. No longer having the thick mucus that was gagging him.    Off oxycodone . Not having any pain. Has noticed some fullness in his neck.    Swallowing well. Pills are no longer an issue.     Taste is a little better than it was. No longer tastes 'like poop'. Could taste the blueberries in a blueberry muffin. Enjoys the yolks of poached eggs, meatloaf.  Had feeding tube out a few weeks ago, healing up fine.     Review of Systems  As above     Objective:          ALPRAZolam (XANAX) 0.5 mg tablet one tablet twice daily as needed.    aspirin  81 mg chewable tablet one tablet by PEG Tube route daily. Indications: stroke prevention    budesonide  (RHINOCORT  AQUA) 32 mcg/actuation nasal spray Apply one spray to each nostril as directed daily.    dexAMETHasone  (DECADRON ) 4 mg tablet Beginning 24 hours after each Cisplatin  infusion, take 2 tablets by mouth daily for 3 days.    fentaNYL  (DURAGESIC ) 50 mcg/hr patch Apply one patch to top of skin as directed every 72 hours. Indications: severe chronic pain with opioid tolerance (Patient not taking: Reported on 06/14/2024)    FREESTYLE LIBRE 2 READER reader Use one each as directed every 6 hours.    hydrOXYzine  HCL (ATARAX ) 25 mg tablet Take 1 Tablet (25 mg) by mouth 3 times daily as needed for Anxiety.    lidocaine  hcl viscous (LIDOCAINE  VISCOUS) 2 % solution Swish and Spit 5 mL by mouth as directed four times daily as needed.    losartan  (COZAAR ) 50 mg tablet Take two tablets by mouth daily.    melatonin 10 mg tablet one tablet by PEG Tube route at bedtime daily. Indications: difficulty sleeping    metFORMIN  (GLUCOPHAGE ) 500 mg tablet Take one tablet by Per G Tube route twice daily with meals. Indications: type 2 diabetes mellitus  naloxone  (NARCAN ) 4 mg/actuation nasal spray Insert 1 spray into 1 nostril as needed for signs of opioid overdose then call 911. May repeat dose every 2-3 minutes (alternate nostrils) until medical team arrives.    OLANZapine  (ZYPREXA ) 5 mg tablet Take 1 tablet by mouth daily at bedtime for 4 days starting on Cisplatin  infusion day.  Indications: prevent nausea and vomiting from cancer chemotherapy    ondansetron  HCL (ZOFRAN ) 8 mg tablet Take one tablet by mouth every 8 hours as needed (nausea and vomiting). Indications: prevent nausea and vomiting from cancer chemotherapy    oxyCODONE  (ROXICODONE ) 5 mg tablet Take one tablet via feeding tube every 4-6 hours as needed for Pain. (Patient not taking: Reported on 06/14/2024)    pilocarpine  HCL (SALAGEN ) 5 mg tablet Take one tablet by mouth.    polyethylene glycol 3350  (MIRALAX ) 17 g packet one packet by Per G Tube route daily. Indications: emptying of the bowel    rivaroxaban  (XARELTO ) 20 mg tablet Take one tablet by mouth daily.    rosuvastatin  (CRESTOR ) 20 mg tablet Take one tablet by PEG Tube route at bedtime daily. Indications: hardening of the arteries due to plaque buildup    sennosides-docusate sodium  (SENOKOT-S) 8.6/50 mg tablet two tablets by Per G Tube route daily. Indications: constipation    sertraline (ZOLOFT) 25 mg tablet Take one tablet by mouth daily.    sodium chloride  (SEA MIST) 0.65 % nasal spray Apply two sprays to each nostril as directed every 6 hours as needed. Indications: dryness of the nose, stuffy nose    thiamine  mononitrate (vit B1) 100 mg tablet one tablet by PEG Tube route daily. Indications: Prevention of thiamine  deficiency    traZODone  (DESYREL ) 150 mg tablet Take one-half tablet by PEG Tube route at bedtime daily. Indications: Insomnia    triamcinolone  acetonide (TRIDERM ) 0.1 % topical cream Apply one g topically to affected area twice daily.    TRULICITY 3 mg/0.5 mL injection pen Inject 0.5 mL under the skin every 7 days. Sunday     Vitals:    06/14/24 1314 06/14/24 1315   BP: 137/89    BP Source: Arm, Left Upper    Pulse: 96    Temp: 36.5 ?C (97.7 ?F)    Resp: 14    SpO2: 98%    TempSrc: Temporal    PainSc:  Zero   Weight: 95.4 kg (210 lb 5.1 oz)    Height: 179.1 cm (5' 10.5)            Body mass index is 29.75 kg/m?.     TSH   Lab Results   Component Value Date/Time    TSH 0.38 12/08/2023 09:59 AM          Pain Score: Zero  Wt Readings from Last 5 Encounters:   06/14/24 95.4 kg (210 lb 5.1 oz)   03/09/24 100.2 kg (221 lb)   02/10/24 100.2 kg (221 lb)   02/10/24 99.8 kg (220 lb)   02/10/24 100.2 kg (221 lb)     PET 10/03/2023        CT Neck 08/27/2023      Fatigue Scale: 0-None    KARNOFSKY PERFORMANCE SCORE:  80%     Physical Exam   General: Patient in no acute distress. Alert and oriented. Appears well.  Head: Normocephalic, atraumatic  Oral Cavity: no trismus, without lesions or masses, pink and moist, saliva low, gums pink and moist no erythema or swelling, some cracked teeth mandible posteriorly, oropharynx pink  w/out masses or ulcerations  Neck: midline trachea without mass or lesion, no adenopathy, normal ROM, mild submental lymphedema  Respiratory: Inspiration and expiration without stridor, effort normal  Psychiatric: Normal mood and affect, thought process and judgment normal  Skin: No rashes or pallor, warm and dry    Flexible Laryngoscopy: After obtaining verbal consent, topical lidocaine  and a decongestant were applied into the right nare and a flexible fiberoptic laryngoscope inserted into the nasal cavity. The nasal anatomy was normal and without mass or mucosal lesion. The laryngoscope was then passed into the nasopharyx which was without evidence of lesions or suspicious masses. Passing the flexible scope into the oropharynx revealed normal elevation of the soft palate that was without any evidence of masses or lesions. No obvious infiltration of the base of tongue was appreciated-- there was edema base of tongue bilaterally. The pyriform sinuses were identified and were without obvious lesions. The posterior pharyngeal wall was unremarkable. The epiglottis is normal in appearance. Right arytenoid normal in appearance. Left arytenoid normal appearing. No appreciable impairment of left and right true/false vocal cord mobility. No evidence of mucosal lesions were seen on the visualized portion of the subglottis. Small amount of thick stringy mucus --improved. The scope was then withdrawn.The patient tolerated the procedure well.       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 He is now s/p concurrent chemoradiation 6996cGy/77fx completed 12/18/2023 with weekly cisplatin  (x5) with Dr. Timoteo. He presents today for interval follow-up.      Restaging PET from 9/15 showed no residual FDG avid oropharyngeal mass or cervical nodal disease, no metastatic disease.    - NED On exam or scope  - Pain improved, no longer on meds. Feeding tube is out, patient is eating regular diet despite dysgeusia.  - Mildly improved xerostomia, continue pilocarpine   - TSH WNL  - F/u with Dr. Timoteo on 10/2  - Follow-up in 3 months with scope or sooner with concerns          Rocky Can, DNP, FNP-C  Nurse Practitioner, Radiation Oncology  [facility]  Available on Voalte and Teams

## 2024-06-07 ENCOUNTER — Encounter: Admit: 2024-06-07 | Discharge: 2024-06-07 | Payer: MEDICARE

## 2024-06-07 NOTE — Telephone Encounter
 Daryl Baker called asking when he can get his port removed, tolled him I would pass on his question to [facility].

## 2024-06-10 ENCOUNTER — Encounter: Admit: 2024-06-10 | Discharge: 2024-06-10 | Payer: MEDICARE

## 2024-06-10 DIAGNOSIS — C109 Malignant neoplasm of oropharynx, unspecified: Principal | ICD-10-CM

## 2024-06-14 ENCOUNTER — Ambulatory Visit: Admit: 2024-06-14 | Discharge: 2024-06-14 | Payer: MEDICARE

## 2024-06-14 ENCOUNTER — Encounter: Admit: 2024-06-14 | Discharge: 2024-06-14 | Payer: MEDICARE

## 2024-06-14 VITALS — BP 137/89 | HR 96 | Temp 97.70000°F | Resp 14 | Ht 70.5 in | Wt 210.3 lb

## 2024-06-14 DIAGNOSIS — C109 Malignant neoplasm of oropharynx, unspecified: Principal | ICD-10-CM

## 2024-06-14 MED ORDER — OXYMETAZOLINE 0.05 % NA SPRY
2 | Freq: Once | NASAL | 0 refills | Status: CP
Start: 2024-06-14 — End: ?
  Administered 2024-06-14: 18:00:00 2 via NASAL

## 2024-06-14 MED ORDER — LIDOCAINE (PF) 10 MG/ML (1 %) IJ SOLN
1 mL | Freq: Once | INTRAMUSCULAR | 0 refills | Status: CP
Start: 2024-06-14 — End: ?
  Administered 2024-06-14: 18:00:00 1 mL via INTRAMUSCULAR

## 2024-06-14 MED ORDER — PILOCARPINE HCL 5 MG PO TAB
5 mg | ORAL_TABLET | Freq: Three times a day (TID) | ORAL | 3 refills | 30.00000 days | Status: AC
Start: 2024-06-14 — End: ?

## 2024-06-14 NOTE — Progress Notes
 Clinical Nutrition Assessment Summary    Daryl Baker is a 68 y.o. 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, AJCC 8th Edition  - Clinical stage from 10/14/2023: Stage II (cT3, cN2, cM0, p16+) -     Past Medical History: Reviewed    Current Treatment Plan: OP HEAD/NECK CISPLATIN  (WEEKLY - 40MG /M2) + XRT     Nutrition Assessment of Patient:       Wt Readings from Last 5 Encounters:   06/14/24 95.4 kg (210 lb 5.1 oz)   03/09/24 100.2 kg (221 lb)   02/10/24 100.2 kg (221 lb)   02/10/24 99.8 kg (220 lb)   02/10/24 100.2 kg (221 lb)        There is no height or weight on file to calculate BMI.    RD met with pt and wife in clinic today.  Pt had G tube removed and taste is improving.  Pt reports maintaining weight around 220-215 lbs; current weight 210 lbs.  Pt reports no longer drinking protein shakes.  Pt reports consuming smaller more frequent meals.  RD provided [facility] pharmacy handout for discounted ONS is needed.  RD will remain available prn.     Nutrition Monitoring and Evaluation:  Goal:< 10% wt loss starting weight (245 lbs)  Time Frame:Throughout Treatment and Recovery  -7lbs ( 3%)     The patient was allowed to ask questions and actively participated in creating plan of care. I provided the patient with my contact information and instructed pt on how to get in touch with me if questions or concerns arise. Thank you for allowing nutrition services to participate in this patients care.     Follow up Date: prn    [name], MS, RD, LD  Phone:[phone]

## 2024-09-09 ENCOUNTER — Encounter: Admit: 2024-09-09 | Discharge: 2024-09-09 | Payer: MEDICARE

## 2024-09-24 ENCOUNTER — Encounter: Admit: 2024-09-24 | Discharge: 2024-09-24 | Payer: MEDICARE

## 2024-10-04 ENCOUNTER — Encounter: Admit: 2024-10-04 | Discharge: 2024-10-04 | Payer: MEDICARE

## 2024-10-07 ENCOUNTER — Encounter: Admit: 2024-10-07 | Discharge: 2024-10-07 | Payer: MEDICARE

## 2024-10-11 ENCOUNTER — Encounter: Admit: 2024-10-11 | Discharge: 2024-10-11 | Payer: MEDICARE

## 2024-10-20 ENCOUNTER — Encounter: Admit: 2024-10-20 | Discharge: 2024-10-20 | Payer: MEDICARE

## 2024-10-25 ENCOUNTER — Encounter: Admit: 2024-10-25 | Discharge: 2024-10-25 | Payer: MEDICARE
